# Patient Record
Sex: Male | Born: 1945 | Race: White | Hispanic: No | Marital: Married | Smoking: Never smoker
Health system: Southern US, Community
[De-identification: ages and names within clinical notes are randomized; demographics above are authoritative.]

## PROBLEM LIST (undated history)

## (undated) DIAGNOSIS — I10 Essential (primary) hypertension: Secondary | ICD-10-CM

## (undated) DIAGNOSIS — N189 Chronic kidney disease, unspecified: Secondary | ICD-10-CM

## (undated) DIAGNOSIS — I639 Cerebral infarction, unspecified: Secondary | ICD-10-CM

## (undated) DIAGNOSIS — E291 Testicular hypofunction: Secondary | ICD-10-CM

## (undated) DIAGNOSIS — F419 Anxiety disorder, unspecified: Secondary | ICD-10-CM

## (undated) DIAGNOSIS — C439 Malignant melanoma of skin, unspecified: Secondary | ICD-10-CM

## (undated) DIAGNOSIS — E785 Hyperlipidemia, unspecified: Secondary | ICD-10-CM

## (undated) HISTORY — DX: Hyperlipidemia, unspecified: E78.5

## (undated) HISTORY — PX: CHOLECYSTECTOMY: SHX55

## (undated) HISTORY — DX: Malignant melanoma of skin, unspecified: C43.9

## (undated) HISTORY — PX: ROTATOR CUFF REPAIR: SHX139

## (undated) HISTORY — DX: Testicular hypofunction: E29.1

## (undated) HISTORY — DX: Cerebral infarction, unspecified: I63.9

## (undated) HISTORY — PX: APPENDECTOMY: SHX54

## (undated) HISTORY — DX: Anxiety disorder, unspecified: F41.9

## (undated) HISTORY — PX: COLONOSCOPY: SHX174

## (undated) HISTORY — DX: Chronic kidney disease, unspecified: N18.9

## (undated) HISTORY — DX: Essential (primary) hypertension: I10

---

## 2008-02-02 ENCOUNTER — Ambulatory Visit: Payer: Self-pay | Admitting: Gastroenterology

## 2010-12-05 ENCOUNTER — Encounter: Payer: Self-pay | Admitting: Family Medicine

## 2010-12-05 DIAGNOSIS — M109 Gout, unspecified: Secondary | ICD-10-CM | POA: Insufficient documentation

## 2010-12-05 DIAGNOSIS — I10 Essential (primary) hypertension: Secondary | ICD-10-CM | POA: Insufficient documentation

## 2011-06-05 DIAGNOSIS — E291 Testicular hypofunction: Secondary | ICD-10-CM | POA: Diagnosis not present

## 2011-06-05 DIAGNOSIS — I10 Essential (primary) hypertension: Secondary | ICD-10-CM | POA: Diagnosis not present

## 2011-06-05 DIAGNOSIS — N419 Inflammatory disease of prostate, unspecified: Secondary | ICD-10-CM | POA: Diagnosis not present

## 2011-11-26 DIAGNOSIS — H6980 Other specified disorders of Eustachian tube, unspecified ear: Secondary | ICD-10-CM | POA: Diagnosis not present

## 2011-11-26 DIAGNOSIS — J019 Acute sinusitis, unspecified: Secondary | ICD-10-CM | POA: Diagnosis not present

## 2012-04-28 DIAGNOSIS — Z23 Encounter for immunization: Secondary | ICD-10-CM | POA: Diagnosis not present

## 2012-07-31 ENCOUNTER — Encounter: Payer: Self-pay | Admitting: *Deleted

## 2012-07-31 DIAGNOSIS — M1A00X0 Idiopathic chronic gout, unspecified site, without tophus (tophi): Secondary | ICD-10-CM | POA: Diagnosis not present

## 2012-07-31 DIAGNOSIS — M1A9XX0 Chronic gout, unspecified, without tophus (tophi): Secondary | ICD-10-CM | POA: Diagnosis not present

## 2012-07-31 DIAGNOSIS — E782 Mixed hyperlipidemia: Secondary | ICD-10-CM | POA: Diagnosis not present

## 2012-07-31 DIAGNOSIS — Z125 Encounter for screening for malignant neoplasm of prostate: Secondary | ICD-10-CM | POA: Diagnosis not present

## 2012-08-05 ENCOUNTER — Encounter: Payer: Self-pay | Admitting: Family Medicine

## 2012-08-05 ENCOUNTER — Ambulatory Visit (INDEPENDENT_AMBULATORY_CARE_PROVIDER_SITE_OTHER): Payer: Medicare Other | Admitting: Family Medicine

## 2012-08-05 VITALS — BP 130/90 | HR 86 | Temp 98.5°F | Resp 16 | Ht 71.0 in | Wt 236.0 lb

## 2012-08-05 DIAGNOSIS — E785 Hyperlipidemia, unspecified: Secondary | ICD-10-CM | POA: Diagnosis not present

## 2012-08-05 DIAGNOSIS — I10 Essential (primary) hypertension: Secondary | ICD-10-CM

## 2012-08-05 DIAGNOSIS — M109 Gout, unspecified: Secondary | ICD-10-CM

## 2012-08-05 DIAGNOSIS — E291 Testicular hypofunction: Secondary | ICD-10-CM

## 2012-08-05 MED ORDER — LOSARTAN POTASSIUM 100 MG PO TABS: 100.0000 mg | ORAL_TABLET | Freq: Every day | ORAL | Status: DC

## 2012-08-05 NOTE — Progress Notes (Signed)
Subjective:     Patient ID: Chad Serrano, male   DOB: 15-Apr-1946, 67 y.o.   MRN: [id number]  HPI He here for followup of his hypertension.  He is currently taking Benicar 40 mg by mouth daily.  However this is too expensive as is $300 per month.  He denies any chest pain shortness of breath or dyspnea on exertion.   He is also here for follow up of his gout.  He is currently on allopurinol 100 mg by mouth.  He denies any recent gout flare.  His most recent uric acid is 5.9.   He is also here for followup of his hypogonadism. He is currently taking AndroGel.  His fatigue, libido, and exercise stamina have improved.  His PSA and CBC are within normal limits He also has hyperlipidemia. His trigs are 170, HDL 41 and LDL is 114.  Is not currently taking any medication.    He is concerned about the recent advertisement on TV regarding cardiovascular disease and testosterone replacement.     Review of Systems  All other systems reviewed and are negative.       Objective:   Physical Exam  Vitals reviewed. Constitutional: He appears well-developed and well-nourished.  HENT:  Head: Normocephalic and atraumatic.  Right Ear: External ear normal.  Left Ear: External ear normal.  Nose: Nose normal.  Mouth/Throat: Oropharynx is clear and moist.  Eyes: Conjunctivae and EOM are normal. Pupils are equal, round, and reactive to light.  Neck: Normal range of motion. Neck supple. No thyromegaly present.  Cardiovascular: Normal rate, regular rhythm and normal heart sounds.   Pulmonary/Chest: Effort normal and breath sounds normal.  Abdominal: Soft. Bowel sounds are normal.       Assessment:     Hypertension, hyperlipidemia, hypogonadism, and gout.     Plan:     #1 discontinue Benicar begin losartan 100 mg by mouth daily for cost reasons.  Recheck blood pressure in 6 months consider adding HCTZ if blood pressure is greater than 140 over 90. #2 hypergonadism is currently well controlled. Spent  10 minutes discussing the risk of cardiovascular disease with AndroGel replacement I advised the patient to discontinue and androgel. He will consider. #3 gout is presently well controlled 100 mg of allopurinol per day. #4 hyperlipidemia-discussed a low saturated fat diet. Recheck in 6 months.

## 2012-08-11 DIAGNOSIS — M19019 Primary osteoarthritis, unspecified shoulder: Secondary | ICD-10-CM | POA: Diagnosis not present

## 2012-08-11 DIAGNOSIS — S46819A Strain of other muscles, fascia and tendons at shoulder and upper arm level, unspecified arm, initial encounter: Secondary | ICD-10-CM | POA: Diagnosis not present

## 2012-08-11 DIAGNOSIS — M75120 Complete rotator cuff tear or rupture of unspecified shoulder, not specified as traumatic: Secondary | ICD-10-CM | POA: Diagnosis not present

## 2012-08-11 DIAGNOSIS — S43499A Other sprain of unspecified shoulder joint, initial encounter: Secondary | ICD-10-CM | POA: Diagnosis not present

## 2012-08-12 ENCOUNTER — Other Ambulatory Visit: Payer: Self-pay | Admitting: Family Medicine

## 2012-08-12 DIAGNOSIS — M109 Gout, unspecified: Secondary | ICD-10-CM

## 2012-08-12 NOTE — Telephone Encounter (Deleted)
Plain Losartan 100mg  just started and refilled on 08/05/2012.  Not in record on combination Losartan/hct 100/12.5. Please advise about this refill.

## 2012-08-12 NOTE — Telephone Encounter (Signed)
Medication refilled per protocol.Patient needs to be seen before any further refills 

## 2012-08-25 ENCOUNTER — Telehealth: Payer: Self-pay | Admitting: Family Medicine

## 2012-08-25 DIAGNOSIS — M109 Gout, unspecified: Secondary | ICD-10-CM

## 2012-08-25 MED ORDER — ALLOPURINOL 100 MG PO TABS: 200.0000 mg | ORAL_TABLET | Freq: Every day | ORAL | Status: DC

## 2012-08-25 NOTE — Telephone Encounter (Signed)
rx refilled.

## 2012-10-27 ENCOUNTER — Telehealth: Payer: Self-pay | Admitting: Family Medicine

## 2012-10-27 NOTE — Telephone Encounter (Signed)
Losartan 100 mg poqday is on his med list.  He can take that in place of benicar.

## 2012-10-28 MED ORDER — LOSARTAN POTASSIUM 100 MG PO TABS: 100.0000 mg | ORAL_TABLET | Freq: Every day | ORAL | Status: DC

## 2012-10-28 NOTE — Telephone Encounter (Signed)
Rx Refilled  

## 2012-12-26 ENCOUNTER — Telehealth: Payer: Self-pay | Admitting: Family Medicine

## 2012-12-26 MED ORDER — ALLOPURINOL 100 MG PO TABS: 200.0000 mg | ORAL_TABLET | Freq: Every day | ORAL | Status: DC

## 2012-12-26 NOTE — Telephone Encounter (Signed)
Rx Refilled  

## 2013-04-13 DIAGNOSIS — Z23 Encounter for immunization: Secondary | ICD-10-CM | POA: Diagnosis not present

## 2013-05-05 ENCOUNTER — Other Ambulatory Visit: Payer: Medicare Other

## 2013-05-05 ENCOUNTER — Other Ambulatory Visit: Payer: Self-pay | Admitting: Family Medicine

## 2013-05-05 DIAGNOSIS — Z79899 Other long term (current) drug therapy: Secondary | ICD-10-CM

## 2013-05-05 DIAGNOSIS — Z125 Encounter for screening for malignant neoplasm of prostate: Secondary | ICD-10-CM | POA: Diagnosis not present

## 2013-05-05 DIAGNOSIS — I10 Essential (primary) hypertension: Secondary | ICD-10-CM | POA: Diagnosis not present

## 2013-05-05 DIAGNOSIS — R7309 Other abnormal glucose: Secondary | ICD-10-CM | POA: Diagnosis not present

## 2013-05-05 DIAGNOSIS — E782 Mixed hyperlipidemia: Secondary | ICD-10-CM | POA: Diagnosis not present

## 2013-05-05 LAB — CBC WITH DIFFERENTIAL/PLATELET
Basophils Relative: 0 % (ref 0–1)
Eosinophils Absolute: 0.2 10*3/uL (ref 0.0–0.7)
HCT: 40.7 % (ref 39.0–52.0)
Hemoglobin: 14.3 g/dL (ref 13.0–17.0)
MCH: 32.1 pg (ref 26.0–34.0)
MCHC: 35.1 g/dL (ref 30.0–36.0)
MCV: 91.5 fL (ref 78.0–100.0)
Monocytes Absolute: 0.5 10*3/uL (ref 0.1–1.0)
Monocytes Relative: 11 % (ref 3–12)

## 2013-05-05 LAB — COMPREHENSIVE METABOLIC PANEL
Albumin: 4 g/dL (ref 3.5–5.2)
Alkaline Phosphatase: 70 U/L (ref 39–117)
BUN: 22 mg/dL (ref 6–23)
Glucose, Bld: 126 mg/dL — ABNORMAL HIGH (ref 70–99)
Potassium: 4.1 mEq/L (ref 3.5–5.3)
Total Bilirubin: 0.7 mg/dL (ref 0.3–1.2)

## 2013-05-05 LAB — LIPID PANEL
HDL: 40 mg/dL (ref >39)
LDL Cholesterol: 87 mg/dL (ref 0–99)
Triglycerides: 255 mg/dL — ABNORMAL HIGH (ref <150)

## 2013-05-11 ENCOUNTER — Ambulatory Visit (INDEPENDENT_AMBULATORY_CARE_PROVIDER_SITE_OTHER): Payer: Medicare Other | Admitting: Family Medicine

## 2013-05-11 ENCOUNTER — Encounter: Payer: Self-pay | Admitting: Family Medicine

## 2013-05-11 VITALS — BP 110/72 | HR 74 | Temp 97.1°F | Resp 16 | Ht 70.5 in | Wt 247.0 lb

## 2013-05-11 DIAGNOSIS — Z23 Encounter for immunization: Secondary | ICD-10-CM | POA: Diagnosis not present

## 2013-05-11 DIAGNOSIS — R7309 Other abnormal glucose: Secondary | ICD-10-CM | POA: Diagnosis not present

## 2013-05-11 DIAGNOSIS — Z0000 Encounter for general adult medical examination without abnormal findings: Secondary | ICD-10-CM | POA: Diagnosis not present

## 2013-05-11 DIAGNOSIS — R739 Hyperglycemia, unspecified: Secondary | ICD-10-CM

## 2013-05-11 LAB — HEMOGLOBIN A1C
Hgb A1c MFr Bld: 5.6 % (ref <5.7)
Mean Plasma Glucose: 114 mg/dL (ref <117)

## 2013-05-11 NOTE — Addendum Note (Signed)
Addended by: Legrand Rams B on: 05/11/2013 10:26 AM   Modules accepted: Orders

## 2013-05-11 NOTE — Progress Notes (Signed)
Subjective:    Patient ID: Chad Serrano, male    DOB: 01/05/1946, 67 y.o.   MRN: [id number]  HPI Patient is here today for a complete physical exam. He denies any complaints. His last colonoscopy was in 2009. It was significant for 2 polyps. He is due this year for a colonoscopy but he would like to schedule this for himself after the first year. His last tetanus shot was 4 years ago. He had his flu shot at an outside pharmacy. He is due for Prevnar 13, Pneumovax 23, and Zostavax. He is also due for his prostate exam. His most recent lab work is listed below. It is significant for an elevated triglyceride level as well as an elevated fasting blood sugar. Lab on 05/05/2013  Component Date Value Range Status  . WBC 05/05/2013 4.5  4.0 - 10.5 K/uL Final  . RBC 05/05/2013 4.45  4.22 - 5.81 MIL/uL Final  . Hemoglobin 05/05/2013 14.3  13.0 - 17.0 g/dL Final  . HCT 78/29/5621 40.7  39.0 - 52.0 % Final  . MCV 05/05/2013 91.5  78.0 - 100.0 fL Final  . MCH 05/05/2013 32.1  26.0 - 34.0 pg Final  . MCHC 05/05/2013 35.1  30.0 - 36.0 g/dL Final  . RDW 30/86/5784 13.8  11.5 - 15.5 % Final  . Platelets 05/05/2013 165  150 - 400 K/uL Final  . Neutrophils Relative % 05/05/2013 42* 43 - 77 % Final  . Neutro Abs 05/05/2013 1.9  1.7 - 7.7 K/uL Final  . Lymphocytes Relative 05/05/2013 43  12 - 46 % Final  . Lymphs Abs 05/05/2013 1.9  0.7 - 4.0 K/uL Final  . Monocytes Relative 05/05/2013 11  3 - 12 % Final  . Monocytes Absolute 05/05/2013 0.5  0.1 - 1.0 K/uL Final  . Eosinophils Relative 05/05/2013 4  0 - 5 % Final  . Eosinophils Absolute 05/05/2013 0.2  0.0 - 0.7 K/uL Final  . Basophils Relative 05/05/2013 0  0 - 1 % Final  . Basophils Absolute 05/05/2013 0.0  0.0 - 0.1 K/uL Final  . Smear Review 05/05/2013 Criteria for review not met   Final  . Sodium 05/05/2013 139  135 - 145 mEq/L Final  . Potassium 05/05/2013 4.1  3.5 - 5.3 mEq/L Final  . Chloride 05/05/2013 104  96 - 112 mEq/L Final  . CO2  05/05/2013 30  19 - 32 mEq/L Final  . Glucose, Bld 05/05/2013 126* 70 - 99 mg/dL Final  . BUN 69/62/9528 22  6 - 23 mg/dL Final  . Creat 41/32/4401 1.29  0.50 - 1.35 mg/dL Final  . Total Bilirubin 05/05/2013 0.7  0.3 - 1.2 mg/dL Final  . Alkaline Phosphatase 05/05/2013 70  39 - 117 U/L Final  . AST 05/05/2013 24  0 - 37 U/L Final  . ALT 05/05/2013 24  0 - 53 U/L Final  . Total Protein 05/05/2013 6.4  6.0 - 8.3 g/dL Final  . Albumin 02/72/5366 4.0  3.5 - 5.2 g/dL Final  . Calcium 44/07/4740 9.2  8.4 - 10.5 mg/dL Final  . Cholesterol 59/56/3875 178  0 - 200 mg/dL Final   Comment: ATP III Classification:                                < 200        mg/dL        Desirable  200 - 239     mg/dL        Borderline High                               >= 240        mg/dL        High                             . Triglycerides 05/05/2013 255* <150 mg/dL Final  . HDL 09/81/1914 40  >39 mg/dL Final  . Total CHOL/HDL Ratio 05/05/2013 4.5   Final  . VLDL 05/05/2013 51* 0 - 40 mg/dL Final  . LDL Cholesterol 05/05/2013 87  0 - 99 mg/dL Final   Comment:                            Total Cholesterol/HDL Ratio:CHD Risk                                                 Coronary Heart Disease Risk Table                                                                 Men       Women                                   1/2 Average Risk              3.4        3.3                                       Average Risk              5.0        4.4                                    2X Average Risk              9.6        7.1                                    3X Average Risk             23.4       11.0                          Use the calculated Patient Ratio above and the CHD Risk table  to determine the patient's CHD Risk.                          ATP III Classification (LDL):                                < 100        mg/dL         Optimal                                100 - 129     mg/dL         Near or Above Optimal                               130 - 159     mg/dL         Borderline High                               160 - 189     mg/dL         High                                > 190        mg/dL         Very High                             . PSA 05/05/2013 0.74  <=4.00 ng/mL Final   Comment: Test Methodology: ECLIA PSA (Electrochemiluminescence Immunoassay)                                                     For PSA values from 2.5-4.0, particularly in younger men <60 years                          old, the AUA and NCCN suggest testing for % Free PSA (3515) and                          evaluation of the rate of increase in PSA (PSA velocity).   Past Medical History  Diagnosis Date  . Hypertension   . Hypogonadism male   . Anxiety   . Gout   . Hyperlipidemia   . Hypogonadism male    Past Surgical History  Procedure Laterality Date  . Appendectomy    . Rotator cuff repair     Current Outpatient Prescriptions on File Prior to Visit  Medication Sig Dispense Refill  . allopurinol (ZYLOPRIM) 100 MG tablet Take 2 tablets (200 mg total) by mouth daily.  180 tablet  1  . losartan (COZAAR) 100 MG tablet Take 1 tablet (100 mg total) by mouth daily.  90 tablet  3  . sildenafil (VIAGRA) 100 MG tablet Take 100 mg by mouth daily as needed.         No current  facility-administered medications on file prior to visit.   No Known Allergies History   Social History  . Marital Status: Married    Spouse Name: N/A    Number of Children: N/A  . Years of Education: N/A   Occupational History  . Not on file.   Social History Main Topics  . Smoking status: Never Smoker   . Smokeless tobacco: Never Used  . Alcohol Use: No  . Drug Use: No  . 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 Not on file   Social History Narrative  . No narrative on file      Review of Systems   All other systems reviewed and are negative.       Objective:   Physical Exam  Vitals reviewed. Constitutional: He is oriented to person, place, and time. He appears well-developed and well-nourished. No distress.  HENT:  Head: Normocephalic and atraumatic.  Right Ear: External ear normal.  Left Ear: External ear normal.  Nose: Nose normal.  Mouth/Throat: Oropharynx is clear and moist. No oropharyngeal exudate.  Eyes: Conjunctivae and EOM are normal. Pupils are equal, round, and reactive to light. Right eye exhibits no discharge. Left eye exhibits no discharge. No scleral icterus.  Neck: Normal range of motion. Neck supple. No JVD present. No tracheal deviation present. No thyromegaly present.  Cardiovascular: Normal rate, regular rhythm, normal heart sounds and intact distal pulses.  Exam reveals no gallop and no friction rub.   No murmur heard. Pulmonary/Chest: Effort normal and breath sounds normal. No stridor. No respiratory distress. He has no wheezes. He has no rales. He exhibits no tenderness.  Abdominal: Soft. Bowel sounds are normal. He exhibits no distension and no mass. There is no tenderness. There is no rebound and no guarding.  Genitourinary: Rectum normal and prostate normal. No penile tenderness.  Musculoskeletal: Normal range of motion. He exhibits no edema and no tenderness.  Lymphadenopathy:    He has no cervical adenopathy.  Neurological: He is alert and oriented to person, place, and time. He has normal reflexes. He displays normal reflexes. No cranial nerve deficit. He exhibits normal muscle tone. Coordination normal.  Skin: Skin is warm. Rash noted. He is not diaphoretic. There is erythema.  Psychiatric: He has a normal mood and affect. His behavior is normal. Judgment and thought content normal.   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 He also has several erythematous macules and papules on his for head and cheeks. He is scheduled to see a  dermatologist later this month.        Assessment & Plan:  1. Routine general medical examination at a health care facility Patient's physical exam was completely normal. The patient received Prevnar 13 today. I recommended Fosamax. The patient called his pharmacy and check on the price first. The patient will schedule his colonoscopy at the first of the year. We discussed a low saturated fat low carbohydrate diet for his elevated triglycerides as well as his elevated fasting blood sugar. I will check hemoglobin A1c and I recommend medication if greater than 6.5. Otherwise his physical exam is normal. 2. Hyperglycemia

## 2013-05-18 DIAGNOSIS — L408 Other psoriasis: Secondary | ICD-10-CM | POA: Diagnosis not present

## 2013-06-22 ENCOUNTER — Other Ambulatory Visit: Payer: Self-pay | Admitting: Family Medicine

## 2013-10-15 DIAGNOSIS — L570 Actinic keratosis: Secondary | ICD-10-CM | POA: Diagnosis not present

## 2013-10-15 DIAGNOSIS — L821 Other seborrheic keratosis: Secondary | ICD-10-CM | POA: Diagnosis not present

## 2013-10-15 DIAGNOSIS — L819 Disorder of pigmentation, unspecified: Secondary | ICD-10-CM | POA: Diagnosis not present

## 2013-10-15 DIAGNOSIS — L723 Sebaceous cyst: Secondary | ICD-10-CM | POA: Diagnosis not present

## 2013-10-21 ENCOUNTER — Other Ambulatory Visit: Payer: Self-pay | Admitting: Family Medicine

## 2013-12-03 ENCOUNTER — Encounter: Payer: Self-pay | Admitting: Family Medicine

## 2013-12-03 ENCOUNTER — Ambulatory Visit (INDEPENDENT_AMBULATORY_CARE_PROVIDER_SITE_OTHER): Payer: Medicare Other | Admitting: Family Medicine

## 2013-12-03 VITALS — BP 119/74 | HR 84 | Temp 99.2°F | Resp 16 | Ht 70.5 in | Wt 233.0 lb

## 2013-12-03 DIAGNOSIS — M545 Low back pain, unspecified: Secondary | ICD-10-CM | POA: Diagnosis not present

## 2013-12-03 NOTE — Progress Notes (Signed)
   Subjective:    Patient ID: Chad Serrano, male    DOB: 15-Apr-1946, 68 y.o.   MRN: [id number]  HPI Patient reports 3 months of midline low back pain with pain radiating into both hips.  The pain radiates into the posterior aspect of both thighs, down both calves, into both feet. He describes the pain as an electrical type pain. He also reports pins and needles in his leg. The pain can also be burning in nature. He denies any specific injury to his back. The pain has gradually been worsening. He denies any numbness in his crotch. He denies any saddle anesthesia. He denies any bowel or bladder incontinence. He denies any weakness in the leg. He has a negative straight-leg raise bilaterally. He has normal muscle in both legs. He has normal reflexes in both legs. Past Medical History  Diagnosis Date  . Hypertension   . Hypogonadism male   . Anxiety   . Gout   . Hyperlipidemia   . Hypogonadism male    Current Outpatient Prescriptions on File Prior to Visit  Medication Sig Dispense Refill  . allopurinol (ZYLOPRIM) 100 MG tablet TAKE 2 TABLETS (200 MG TOTAL) BY MOUTH DAILY.  180 tablet  1  . losartan (COZAAR) 100 MG tablet TAKE 1 TABLET (100 MG TOTAL) BY MOUTH DAILY.  90 tablet  3  . sildenafil (VIAGRA) 100 MG tablet Take 100 mg by mouth daily as needed.         No current facility-administered medications on file prior to visit.   No Known Allergies History   Social History  . Marital Status: Married    Spouse Name: N/A    Number of Children: N/A  . Years of Education: N/A   Occupational History  . Not on file.   Social History Main Topics  . Smoking status: Never Smoker   . Smokeless tobacco: Never Used  . Alcohol Use: No  . Drug Use: No  . 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 Not on file   Social History Narrative  . No narrative on file      Review of Systems  All other systems reviewed and  are negative.      Objective:   Physical Exam  Vitals reviewed. Constitutional: He is oriented to person, place, and time.  Cardiovascular: Normal rate, regular rhythm and normal heart sounds.   Pulmonary/Chest: Effort normal and breath sounds normal.  Musculoskeletal: Normal range of motion. He exhibits no edema.       Lumbar back: He exhibits tenderness and pain. He exhibits normal range of motion and no bony tenderness.  Neurological: He is alert and oriented to person, place, and time. He has normal reflexes. He displays normal reflexes. No cranial nerve deficit. He exhibits normal muscle tone. Coordination normal.          Assessment & Plan:  1. Midline low back pain, with sciatica presence unspecified Patient symptoms sound like spinal stenosis. However his exam is extremely reassuring and normal. Proceed with an MRI of the lumbar spine. The patient has moderate to severe spinal stenosis, he may want to consider epidural steroid injections versus surgery. We will discuss treatment options further depending upon the results of the MRI.

## 2013-12-05 ENCOUNTER — Ambulatory Visit
Admission: RE | Admit: 2013-12-05 | Discharge: 2013-12-05 | Disposition: A | Discharge status: Home / Self Care | Payer: Medicare Other | Source: Ambulatory Visit | Attending: Family Medicine | Admitting: Family Medicine

## 2013-12-05 DIAGNOSIS — M545 Low back pain: Secondary | ICD-10-CM

## 2013-12-05 DIAGNOSIS — M48061 Spinal stenosis, lumbar region without neurogenic claudication: Secondary | ICD-10-CM | POA: Diagnosis not present

## 2013-12-05 DIAGNOSIS — M5137 Other intervertebral disc degeneration, lumbosacral region: Secondary | ICD-10-CM | POA: Diagnosis not present

## 2013-12-05 DIAGNOSIS — M47817 Spondylosis without myelopathy or radiculopathy, lumbosacral region: Secondary | ICD-10-CM | POA: Diagnosis not present

## 2013-12-05 DIAGNOSIS — M5126 Other intervertebral disc displacement, lumbar region: Secondary | ICD-10-CM | POA: Diagnosis not present

## 2013-12-08 ENCOUNTER — Telehealth: Payer: Self-pay | Admitting: Family Medicine

## 2013-12-08 DIAGNOSIS — M48061 Spinal stenosis, lumbar region without neurogenic claudication: Secondary | ICD-10-CM

## 2013-12-08 NOTE — Telephone Encounter (Signed)
Pt called about MRI results.  Results given.  Told  Dr Dennard Schaumann wanted to know if he was interested in steroid injections.  Pt wanted to know what all his options may be.  Told him we will go ahead with referral to Dr Gloris Manchester.  He can discuss with patient about his results further and all his options.  Pt agreeable.

## 2013-12-08 NOTE — Telephone Encounter (Signed)
Message copied by Olena Mater on Tue Dec 08, 2013  2:51 PM ------      Message from: Jenna Luo      Created: Mon Dec 07, 2013  7:07 AM       Appears to have spinal stenosis at L4-5.  Is he interested in epidural steroid injection.  If so, consult Dr. Nelva Bush. ------

## 2013-12-16 DIAGNOSIS — M545 Low back pain, unspecified: Secondary | ICD-10-CM | POA: Diagnosis not present

## 2013-12-16 DIAGNOSIS — M48061 Spinal stenosis, lumbar region without neurogenic claudication: Secondary | ICD-10-CM | POA: Diagnosis not present

## 2013-12-16 DIAGNOSIS — M5137 Other intervertebral disc degeneration, lumbosacral region: Secondary | ICD-10-CM | POA: Diagnosis not present

## 2013-12-25 DIAGNOSIS — M545 Low back pain, unspecified: Secondary | ICD-10-CM | POA: Diagnosis not present

## 2014-04-22 DIAGNOSIS — M4807 Spinal stenosis, lumbosacral region: Secondary | ICD-10-CM | POA: Diagnosis not present

## 2014-04-22 DIAGNOSIS — M5136 Other intervertebral disc degeneration, lumbar region: Secondary | ICD-10-CM | POA: Diagnosis not present

## 2014-04-22 DIAGNOSIS — M545 Low back pain: Secondary | ICD-10-CM | POA: Diagnosis not present

## 2014-04-26 DIAGNOSIS — H52223 Regular astigmatism, bilateral: Secondary | ICD-10-CM | POA: Diagnosis not present

## 2014-04-26 DIAGNOSIS — H524 Presbyopia: Secondary | ICD-10-CM | POA: Diagnosis not present

## 2014-04-26 DIAGNOSIS — H5203 Hypermetropia, bilateral: Secondary | ICD-10-CM | POA: Diagnosis not present

## 2014-04-26 DIAGNOSIS — H04123 Dry eye syndrome of bilateral lacrimal glands: Secondary | ICD-10-CM | POA: Diagnosis not present

## 2014-04-30 ENCOUNTER — Other Ambulatory Visit: Payer: Self-pay | Admitting: Family Medicine

## 2014-04-30 ENCOUNTER — Other Ambulatory Visit (INDEPENDENT_AMBULATORY_CARE_PROVIDER_SITE_OTHER): Payer: Medicare Other | Admitting: Family Medicine

## 2014-04-30 DIAGNOSIS — Z23 Encounter for immunization: Secondary | ICD-10-CM

## 2014-04-30 DIAGNOSIS — Z0000 Encounter for general adult medical examination without abnormal findings: Secondary | ICD-10-CM | POA: Diagnosis not present

## 2014-04-30 DIAGNOSIS — Z79899 Other long term (current) drug therapy: Secondary | ICD-10-CM

## 2014-04-30 DIAGNOSIS — I10 Essential (primary) hypertension: Secondary | ICD-10-CM | POA: Diagnosis not present

## 2014-04-30 LAB — CBC WITH DIFFERENTIAL/PLATELET
BASOS ABS: 0 10*3/uL (ref 0.0–0.1)
BASOS PCT: 1 % (ref 0–1)
Eosinophils Absolute: 0.1 10*3/uL (ref 0.0–0.7)
Eosinophils Relative: 3 % (ref 0–5)
HCT: 41.4 % (ref 39.0–52.0)
Hemoglobin: 14.4 g/dL (ref 13.0–17.0)
Lymphocytes Relative: 42 % (ref 12–46)
Lymphs Abs: 1.8 10*3/uL (ref 0.7–4.0)
MCH: 32.1 pg (ref 26.0–34.0)
MCHC: 34.8 g/dL (ref 30.0–36.0)
MCV: 92.4 fL (ref 78.0–100.0)
MPV: 10.4 fL (ref 9.4–12.4)
Monocytes Absolute: 0.5 10*3/uL (ref 0.1–1.0)
Monocytes Relative: 13 % — ABNORMAL HIGH (ref 3–12)
NEUTROS ABS: 1.7 10*3/uL (ref 1.7–7.7)
NEUTROS PCT: 41 % — AB (ref 43–77)
PLATELETS: 175 10*3/uL (ref 150–400)
RBC: 4.48 MIL/uL (ref 4.22–5.81)
RDW: 13.5 % (ref 11.5–15.5)
WBC: 4.2 10*3/uL (ref 4.0–10.5)

## 2014-04-30 LAB — LIPID PANEL
CHOLESTEROL: 184 mg/dL (ref 0–200)
HDL: 47 mg/dL (ref >39)
LDL CALC: 115 mg/dL — AB (ref 0–99)
Total CHOL/HDL Ratio: 3.9 Ratio
Triglycerides: 109 mg/dL (ref <150)
VLDL: 22 mg/dL (ref 0–40)

## 2014-04-30 LAB — COMPLETE METABOLIC PANEL WITH GFR
ALBUMIN: 4 g/dL (ref 3.5–5.2)
ALK PHOS: 72 U/L (ref 39–117)
ALT: 21 U/L (ref 0–53)
AST: 27 U/L (ref 0–37)
BUN: 19 mg/dL (ref 6–23)
CO2: 28 mEq/L (ref 19–32)
Calcium: 9 mg/dL (ref 8.4–10.5)
Chloride: 102 mEq/L (ref 96–112)
Creat: 1.36 mg/dL — ABNORMAL HIGH (ref 0.50–1.35)
GFR, Est African American: 61 mL/min
GFR, Est Non African American: 53 mL/min — ABNORMAL LOW
Glucose, Bld: 97 mg/dL (ref 70–99)
Potassium: 4.5 mEq/L (ref 3.5–5.3)
SODIUM: 137 meq/L (ref 135–145)
TOTAL PROTEIN: 6.2 g/dL (ref 6.0–8.3)
Total Bilirubin: 0.8 mg/dL (ref 0.2–1.2)

## 2014-04-30 LAB — HEMOGLOBIN A1C
Hgb A1c MFr Bld: 5.8 % — ABNORMAL HIGH (ref <5.7)
MEAN PLASMA GLUCOSE: 120 mg/dL — AB (ref <117)

## 2014-05-06 ENCOUNTER — Encounter: Payer: Self-pay | Admitting: Family Medicine

## 2014-05-06 ENCOUNTER — Ambulatory Visit (INDEPENDENT_AMBULATORY_CARE_PROVIDER_SITE_OTHER): Payer: Medicare Other | Admitting: Family Medicine

## 2014-05-06 VITALS — BP 112/70 | HR 78 | Temp 97.8°F | Resp 16 | Ht 70.5 in | Wt 232.0 lb

## 2014-05-06 DIAGNOSIS — Z0000 Encounter for general adult medical examination without abnormal findings: Secondary | ICD-10-CM

## 2014-05-06 MED ORDER — VARDENAFIL HCL 10 MG PO TABS: 10.0000 mg | ORAL_TABLET | Freq: Every day | ORAL | Status: DC | PRN

## 2014-05-06 NOTE — Progress Notes (Signed)
Subjective:    Patient ID: Chad Serrano, male    DOB: [birth year], 68 y.o.   MRN: [id number]  HPI  Patient is here today for complete physical exam. He is still dealing with some lumbar spinal stenosis. He is managing it conservatively with physical therapy. At the time he denies any symptoms of cauda equina syndrome. His last colonoscopy was in 2009. He is due for prostate cancer screening today. Patient has had Prevnar 13, Pneumovax 23, and his flu shot. His most recent lab work as listed below Lab on 04/30/2014  Component Date Value Ref Range Status  . Cholesterol 04/30/2014 184  0 - 200 mg/dL Final   Comment: ATP III Classification:       < 200        mg/dL        Desirable      200 - 239     mg/dL        Borderline High      >= 240        mg/dL        High     . Triglycerides 04/30/2014 109  <150 mg/dL Final  . HDL 04/30/2014 47  >39 mg/dL Final  . Total CHOL/HDL Ratio 04/30/2014 3.9   Final  . VLDL 04/30/2014 22  0 - 40 mg/dL Final  . LDL Cholesterol 04/30/2014 115* 0 - 99 mg/dL Final   Comment:   Total Cholesterol/HDL Ratio:CHD Risk                        Coronary Heart Disease Risk Table                                        Men       Women          1/2 Average Risk              3.4        3.3              Average Risk              5.0        4.4           2X Average Risk              9.6        7.1           3X Average Risk             23.4       11.0 Use the calculated Patient Ratio above and the CHD Risk table  to determine the patient's CHD Risk. ATP III Classification (LDL):       < 100        mg/dL         Optimal      100 - 129     mg/dL         Near or Above Optimal      130 - 159     mg/dL         Borderline High      160 - 189     mg/dL         High       > 190        mg/dL  Very High     . Hgb A1c MFr Bld 04/30/2014 5.8* <5.7 % Final   Comment:                                                                        According to the ADA Clinical  Practice Recommendations for 2011, when HbA1c is used as a screening test:     >=6.5%   Diagnostic of Diabetes Mellitus            (if abnormal result is confirmed)   5.7-6.4%   Increased risk of developing Diabetes Mellitus   References:Diagnosis and Classification of Diabetes Mellitus,Diabetes ZOXW,9604,54(UJWJX 1):S62-S69 and Standards of Medical Care in         Diabetes - 2011,Diabetes BJYN,8295,62 (Suppl 1):S11-S61.     . Mean Plasma Glucose 04/30/2014 120* <117 mg/dL Final  . WBC 04/30/2014 4.2  4.0 - 10.5 K/uL Final  . RBC 04/30/2014 4.48  4.22 - 5.81 MIL/uL Final  . Hemoglobin 04/30/2014 14.4  13.0 - 17.0 g/dL Final  . HCT 04/30/2014 41.4  39.0 - 52.0 % Final  . MCV 04/30/2014 92.4  78.0 - 100.0 fL Final  . MCH 04/30/2014 32.1  26.0 - 34.0 pg Final  . MCHC 04/30/2014 34.8  30.0 - 36.0 g/dL Final  . RDW 04/30/2014 13.5  11.5 - 15.5 % Final  . Platelets 04/30/2014 175  150 - 400 K/uL Final  . MPV 04/30/2014 10.4  9.4 - 12.4 fL Final  . Neutrophils Relative % 04/30/2014 41* 43 - 77 % Final  . Neutro Abs 04/30/2014 1.7  1.7 - 7.7 K/uL Final  . Lymphocytes Relative 04/30/2014 42  12 - 46 % Final  . Lymphs Abs 04/30/2014 1.8  0.7 - 4.0 K/uL Final  . Monocytes Relative 04/30/2014 13* 3 - 12 % Final  . Monocytes Absolute 04/30/2014 0.5  0.1 - 1.0 K/uL Final  . Eosinophils Relative 04/30/2014 3  0 - 5 % Final  . Eosinophils Absolute 04/30/2014 0.1  0.0 - 0.7 K/uL Final  . Basophils Relative 04/30/2014 1  0 - 1 % Final  . Basophils Absolute 04/30/2014 0.0  0.0 - 0.1 K/uL Final  . Smear Review 04/30/2014 Criteria for review not met   Final  . Sodium 04/30/2014 137  135 - 145 mEq/L Final  . Potassium 04/30/2014 4.5  3.5 - 5.3 mEq/L Final  . Chloride 04/30/2014 102  96 - 112 mEq/L Final  . CO2 04/30/2014 28  19 - 32 mEq/L Final  . Glucose, Bld 04/30/2014 97  70 - 99 mg/dL Final  . BUN 04/30/2014 19  6 - 23 mg/dL Final  . Creat 04/30/2014 1.36* 0.50 - 1.35 mg/dL Final  . Total  Bilirubin 04/30/2014 0.8  0.2 - 1.2 mg/dL Final  . Alkaline Phosphatase 04/30/2014 72  39 - 117 U/L Final  . AST 04/30/2014 27  0 - 37 U/L Final  . ALT 04/30/2014 21  0 - 53 U/L Final  . Total Protein 04/30/2014 6.2  6.0 - 8.3 g/dL Final  . Albumin 04/30/2014 4.0  3.5 - 5.2 g/dL Final  . Calcium 04/30/2014 9.0  8.4 - 10.5 mg/dL Final  . GFR, Est African American 04/30/2014 61   Final  .  GFR, Est Non African American 04/30/2014 53*  Final   Comment:   The estimated GFR is a calculation valid for adults (>=46 years old) that uses the CKD-EPI algorithm to adjust for age and sex. It is   not to be used for children, pregnant women, hospitalized patients,    patients on dialysis, or with rapidly changing kidney function. According to the NKDEP, eGFR >89 is normal, 60-89 shows mild impairment, 30-59 shows moderate impairment, 15-29 shows severe impairment and <15 is ESRD.      Past Medical History  Diagnosis Date  . Hypertension   . Hypogonadism male   . Anxiety   . Gout   . Hyperlipidemia   . Hypogonadism male    Past Surgical History  Procedure Laterality Date  . Appendectomy    . Rotator cuff repair     Current Outpatient Prescriptions on File Prior to Visit  Medication Sig Dispense Refill  . allopurinol (ZYLOPRIM) 100 MG tablet TAKE 2 TABLETS (200 MG TOTAL) BY MOUTH DAILY. 180 tablet 1  . losartan (COZAAR) 100 MG tablet TAKE 1 TABLET (100 MG TOTAL) BY MOUTH DAILY. 90 tablet 3  . sildenafil (VIAGRA) 100 MG tablet Take 100 mg by mouth daily as needed.       No current facility-administered medications on file prior to visit.   No Known Allergies History   Social History  . Marital Status: Married    Spouse Name: N/A    Number of Children: N/A  . Years of Education: N/A   Occupational History  . Not on file.   Social History Main Topics  . Smoking status: Never Smoker   . Smokeless tobacco: Never Used  . Alcohol Use: No  . Drug Use: No  . 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 Not on file   Social History Narrative   No family history on file.   Review of Systems  All other systems reviewed and are negative.      Objective:   Physical Exam  Constitutional: He is oriented to person, place, and time. He appears well-developed and well-nourished.  HENT:  Head: Normocephalic and atraumatic.  Right Ear: External ear normal.  Left Ear: External ear normal.  Nose: Nose normal.  Mouth/Throat: Oropharynx is clear and moist. No oropharyngeal exudate.  Eyes: Conjunctivae and EOM are normal. Pupils are equal, round, and reactive to light.  Neck: Normal range of motion. Neck supple. No JVD present. No tracheal deviation present. No thyromegaly present.  Cardiovascular: Normal rate, regular rhythm, normal heart sounds and intact distal pulses.  Exam reveals no gallop and no friction rub.   No murmur heard. Pulmonary/Chest: Effort normal and breath sounds normal. No stridor. No respiratory distress. He has no wheezes. He has no rales. He exhibits no tenderness.  Abdominal: Soft. Bowel sounds are normal. He exhibits no distension and no mass. There is no tenderness. There is no rebound and no guarding.  Genitourinary: Rectum normal, prostate normal and penis normal. Guaiac negative stool. No penile tenderness.  Musculoskeletal: Normal range of motion. He exhibits no edema or tenderness.  Lymphadenopathy:    He has no cervical adenopathy.  Neurological: He is alert and oriented to person, place, and time. He has normal reflexes. He displays normal reflexes. No cranial nerve deficit. He exhibits normal muscle tone. Coordination normal.  Skin: Skin is warm. No rash noted. No erythema.  No pallor.  Psychiatric: He has a normal mood and affect. His behavior is normal. Judgment and thought content normal.  Vitals reviewed.         Assessment & Plan:  Routine general medical  examination at a health care facility  Patient's physical exam today is completely normal. His blood pressure is excellent. His cholesterol is acceptable. We discussed a low carbohydrate diet to address hyperglycemia. His immunizations are up-to-date. His prostate exam is now up-to-date. I will add up to his lab work.

## 2014-05-07 LAB — PSA: PSA: 1 ng/mL (ref <=4.00)

## 2014-05-10 ENCOUNTER — Encounter: Payer: Self-pay | Admitting: *Deleted

## 2014-07-07 ENCOUNTER — Other Ambulatory Visit: Payer: Self-pay | Admitting: Family Medicine

## 2014-08-12 DIAGNOSIS — G894 Chronic pain syndrome: Secondary | ICD-10-CM | POA: Diagnosis not present

## 2014-08-12 DIAGNOSIS — Z79891 Long term (current) use of opiate analgesic: Secondary | ICD-10-CM | POA: Diagnosis not present

## 2014-08-12 DIAGNOSIS — M5136 Other intervertebral disc degeneration, lumbar region: Secondary | ICD-10-CM | POA: Diagnosis not present

## 2014-08-12 DIAGNOSIS — M4807 Spinal stenosis, lumbosacral region: Secondary | ICD-10-CM | POA: Diagnosis not present

## 2014-10-08 DIAGNOSIS — L820 Inflamed seborrheic keratosis: Secondary | ICD-10-CM | POA: Diagnosis not present

## 2014-10-08 DIAGNOSIS — L821 Other seborrheic keratosis: Secondary | ICD-10-CM | POA: Diagnosis not present

## 2014-10-08 DIAGNOSIS — L409 Psoriasis, unspecified: Secondary | ICD-10-CM | POA: Diagnosis not present

## 2014-10-08 DIAGNOSIS — L218 Other seborrheic dermatitis: Secondary | ICD-10-CM | POA: Diagnosis not present

## 2014-10-08 DIAGNOSIS — L400 Psoriasis vulgaris: Secondary | ICD-10-CM | POA: Diagnosis not present

## 2014-10-08 DIAGNOSIS — L812 Freckles: Secondary | ICD-10-CM | POA: Diagnosis not present

## 2014-10-08 DIAGNOSIS — D485 Neoplasm of uncertain behavior of skin: Secondary | ICD-10-CM | POA: Diagnosis not present

## 2014-10-19 ENCOUNTER — Other Ambulatory Visit: Payer: Self-pay | Admitting: Family Medicine

## 2014-10-28 DIAGNOSIS — M4807 Spinal stenosis, lumbosacral region: Secondary | ICD-10-CM | POA: Diagnosis not present

## 2014-10-28 DIAGNOSIS — Z79891 Long term (current) use of opiate analgesic: Secondary | ICD-10-CM | POA: Diagnosis not present

## 2014-10-28 DIAGNOSIS — M5136 Other intervertebral disc degeneration, lumbar region: Secondary | ICD-10-CM | POA: Diagnosis not present

## 2014-12-30 ENCOUNTER — Other Ambulatory Visit: Payer: Self-pay | Admitting: Family Medicine

## 2014-12-30 NOTE — Telephone Encounter (Signed)
Medication refilled per protocol. 

## 2015-03-24 ENCOUNTER — Telehealth: Payer: Self-pay | Admitting: Family Medicine

## 2015-03-24 DIAGNOSIS — M48061 Spinal stenosis, lumbar region without neurogenic claudication: Secondary | ICD-10-CM

## 2015-03-24 NOTE — Telephone Encounter (Signed)
Ok with referral. 

## 2015-03-24 NOTE — Telephone Encounter (Signed)
Pt left a message requesting a referral to neurosurgeon Dr. Sherley Bounds. You can reach him @ [phone]

## 2015-03-24 NOTE — Telephone Encounter (Signed)
MD please advise

## 2015-03-25 NOTE — Telephone Encounter (Signed)
Called pt informed provider approved referral to Neurosurgeon states it is for his spinal stenosis.

## 2015-03-25 NOTE — Telephone Encounter (Signed)
Referral placed.

## 2015-03-29 DIAGNOSIS — M48061 Spinal stenosis, lumbar region without neurogenic claudication: Secondary | ICD-10-CM | POA: Insufficient documentation

## 2015-03-29 DIAGNOSIS — M4806 Spinal stenosis, lumbar region: Secondary | ICD-10-CM | POA: Diagnosis not present

## 2015-03-29 DIAGNOSIS — M4316 Spondylolisthesis, lumbar region: Secondary | ICD-10-CM | POA: Diagnosis not present

## 2015-03-29 DIAGNOSIS — M4310 Spondylolisthesis, site unspecified: Secondary | ICD-10-CM | POA: Insufficient documentation

## 2015-04-21 DIAGNOSIS — M4806 Spinal stenosis, lumbar region: Secondary | ICD-10-CM | POA: Diagnosis not present

## 2015-04-21 DIAGNOSIS — M4316 Spondylolisthesis, lumbar region: Secondary | ICD-10-CM | POA: Diagnosis not present

## 2015-05-05 ENCOUNTER — Other Ambulatory Visit: Payer: Medicare Other

## 2015-05-05 ENCOUNTER — Ambulatory Visit (INDEPENDENT_AMBULATORY_CARE_PROVIDER_SITE_OTHER): Payer: Medicare Other | Admitting: *Deleted

## 2015-05-05 DIAGNOSIS — E785 Hyperlipidemia, unspecified: Secondary | ICD-10-CM

## 2015-05-05 DIAGNOSIS — E291 Testicular hypofunction: Secondary | ICD-10-CM

## 2015-05-05 DIAGNOSIS — Z23 Encounter for immunization: Secondary | ICD-10-CM | POA: Diagnosis not present

## 2015-05-05 DIAGNOSIS — Z79899 Other long term (current) drug therapy: Secondary | ICD-10-CM

## 2015-05-05 DIAGNOSIS — M109 Gout, unspecified: Secondary | ICD-10-CM | POA: Diagnosis not present

## 2015-05-05 DIAGNOSIS — Z0000 Encounter for general adult medical examination without abnormal findings: Secondary | ICD-10-CM | POA: Diagnosis not present

## 2015-05-05 DIAGNOSIS — Z125 Encounter for screening for malignant neoplasm of prostate: Secondary | ICD-10-CM | POA: Diagnosis not present

## 2015-05-05 DIAGNOSIS — I10 Essential (primary) hypertension: Secondary | ICD-10-CM

## 2015-05-05 LAB — COMPLETE METABOLIC PANEL WITH GFR
ALBUMIN: 3.6 g/dL (ref 3.6–5.1)
ALK PHOS: 58 U/L (ref 40–115)
ALT: 30 U/L (ref 9–46)
AST: 34 U/L (ref 10–35)
BUN: 23 mg/dL (ref 7–25)
CHLORIDE: 103 mmol/L (ref 98–110)
CO2: 26 mmol/L (ref 20–31)
Calcium: 9 mg/dL (ref 8.6–10.3)
Creat: 1.5 mg/dL — ABNORMAL HIGH (ref 0.70–1.25)
GFR, EST NON AFRICAN AMERICAN: 47 mL/min — AB (ref >=60)
GFR, Est African American: 54 mL/min — ABNORMAL LOW (ref >=60)
GLUCOSE: 93 mg/dL (ref 70–99)
POTASSIUM: 4.4 mmol/L (ref 3.5–5.3)
SODIUM: 139 mmol/L (ref 135–146)
Total Bilirubin: 0.7 mg/dL (ref 0.2–1.2)
Total Protein: 6.2 g/dL (ref 6.1–8.1)

## 2015-05-05 LAB — CBC WITH DIFFERENTIAL/PLATELET
Basophils Absolute: 0.1 10*3/uL (ref 0.0–0.1)
Basophils Relative: 1 % (ref 0–1)
EOS PCT: 2 % (ref 0–5)
Eosinophils Absolute: 0.1 10*3/uL (ref 0.0–0.7)
HEMATOCRIT: 43.1 % (ref 39.0–52.0)
HEMOGLOBIN: 14.3 g/dL (ref 13.0–17.0)
LYMPHS PCT: 43 % (ref 12–46)
Lymphs Abs: 2.3 10*3/uL (ref 0.7–4.0)
MCH: 31.8 pg (ref 26.0–34.0)
MCHC: 33.2 g/dL (ref 30.0–36.0)
MCV: 95.8 fL (ref 78.0–100.0)
MONO ABS: 0.5 10*3/uL (ref 0.1–1.0)
MONOS PCT: 10 % (ref 3–12)
MPV: 10.1 fL (ref 8.6–12.4)
NEUTROS ABS: 2.4 10*3/uL (ref 1.7–7.7)
Neutrophils Relative %: 44 % (ref 43–77)
Platelets: 179 10*3/uL (ref 150–400)
RBC: 4.5 MIL/uL (ref 4.22–5.81)
RDW: 13.3 % (ref 11.5–15.5)
WBC: 5.4 10*3/uL (ref 4.0–10.5)

## 2015-05-05 LAB — LIPID PANEL
CHOL/HDL RATIO: 3.9 ratio (ref <=5.0)
Cholesterol: 203 mg/dL — ABNORMAL HIGH (ref 125–200)
HDL: 52 mg/dL (ref >=40)
LDL CALC: 127 mg/dL (ref <130)
TRIGLYCERIDES: 121 mg/dL (ref <150)
VLDL: 24 mg/dL (ref <30)

## 2015-05-05 LAB — TSH: TSH: 3.08 u[IU]/mL (ref 0.350–4.500)

## 2015-05-05 NOTE — Progress Notes (Signed)
Patient ID: Chad Serrano, male   DOB: 08-08-1945, 69 y.o.   MRN: [id number]  Patient seen in office for Influenza Vaccination.   Tolerated IM administration well.   Immunization history updated.

## 2015-05-06 LAB — TESTOSTERONE: Testosterone: 303 ng/dL (ref 300–890)

## 2015-05-06 LAB — HEMOGLOBIN A1C
Hgb A1c MFr Bld: 5.8 % — ABNORMAL HIGH (ref <5.7)
Mean Plasma Glucose: 120 mg/dL — ABNORMAL HIGH (ref <117)

## 2015-05-06 LAB — PSA: PSA: 0.79 ng/mL (ref <=4.00)

## 2015-05-09 ENCOUNTER — Ambulatory Visit (INDEPENDENT_AMBULATORY_CARE_PROVIDER_SITE_OTHER): Payer: Medicare Other | Admitting: Family Medicine

## 2015-05-09 ENCOUNTER — Encounter: Payer: Self-pay | Admitting: Family Medicine

## 2015-05-09 VITALS — BP 112/68 | HR 84 | Temp 98.5°F | Resp 14 | Ht 70.5 in | Wt 235.0 lb

## 2015-05-09 DIAGNOSIS — Z0000 Encounter for general adult medical examination without abnormal findings: Secondary | ICD-10-CM

## 2015-05-09 DIAGNOSIS — R0789 Other chest pain: Secondary | ICD-10-CM | POA: Diagnosis not present

## 2015-05-09 MED ORDER — ALPRAZOLAM 0.5 MG PO TABS: 0.5000 mg | ORAL_TABLET | Freq: Three times a day (TID) | ORAL | Status: DC | PRN

## 2015-05-09 NOTE — Progress Notes (Signed)
Subjective:    Patient ID: Chad Serrano, male    DOB: 05/16/1946, 69 y.o.   MRN: [id number]  HPI Here for CPE.  Last colonoscopy is 01/2008.  Immunizations are up-to-date. The patient's insurance will not cover the shingles vaccine and therefore he declines it.  However recently he has been having unusual pains in his left chest. The pains will last just a few seconds to a minute. He describes it as a burning tingling pain. It is located in the left upper chest. It is unrelated to exercise. He denies any dyspnea on exertion or shortness of breath. He performs cardio exercise 5 days a week with no decrease in exercise tolerance or chest pain. EKG today shows normal sinus rhythm with normal intervals and normal axis with no evidence of ischemia or infarction. He also denies any cough or hemoptysis. He does report his family has been under increasing stress recently. He does not go into detail but he states that he is having difficult time sleeping. He notices that whenever his stress is higher, he notices unusual pain more often. He believes it could be stress causing it. L ab on 05/05/2015  Component Date Value Ref Range Status  . Sodium 05/05/2015 139  135 - 146 mmol/L Final  . Potassium 05/05/2015 4.4  3.5 - 5.3 mmol/L Final  . Chloride 05/05/2015 103  98 - 110 mmol/L Final  . CO2 05/05/2015 26  20 - 31 mmol/L Final  . Glucose, Bld 05/05/2015 93  70 - 99 mg/dL Final  . BUN 05/05/2015 23  7 - 25 mg/dL Final  . Creat 05/05/2015 1.50* 0.70 - 1.25 mg/dL Final  . Total Bilirubin 05/05/2015 0.7  0.2 - 1.2 mg/dL Final  . Alkaline Phosphatase 05/05/2015 58  40 - 115 U/L Final  . AST 05/05/2015 34  10 - 35 U/L Final  . ALT 05/05/2015 30  9 - 46 U/L Final  . Total Protein 05/05/2015 6.2  6.1 - 8.1 g/dL Final  . Albumin 05/05/2015 3.6  3.6 - 5.1 g/dL Final  . Calcium 05/05/2015 9.0  8.6 - 10.3 mg/dL Final  . GFR, Est African American 05/05/2015 54* >=60 mL/min Final  . GFR, Est Non African American  05/05/2015 47* >=60 mL/min Final   Comment:   The estimated GFR is a calculation valid for adults (>=52 years old) that uses the CKD-EPI algorithm to adjust for age and sex. It is   not to be used for children, pregnant women, hospitalized patients,    patients on dialysis, or with rapidly changing kidney function. According to the NKDEP, eGFR >89 is normal, 60-89 shows mild impairment, 30-59 shows moderate impairment, 15-29 shows severe impairment and <15 is ESRD.     Marland Kitchen TSH 05/05/2015 3.080  0.350 - 4.500 uIU/mL Final  . Cholesterol 05/05/2015 203* 125 - 200 mg/dL Final  . Triglycerides 05/05/2015 121  <150 mg/dL Final  . HDL 05/05/2015 52  >=40 mg/dL Final  . Total CHOL/HDL Ratio 05/05/2015 3.9  <=5.0 Ratio Final  . VLDL 05/05/2015 24  <30 mg/dL Final  . LDL Cholesterol 05/05/2015 127  <130 mg/dL Final   Comment:   Total Cholesterol/HDL Ratio:CHD Risk                        Coronary Heart Disease Risk Table  Men       Women          1/2 Average Risk              3.4        3.3              Average Risk              5.0        4.4           2X Average Risk              9.6        7.1           3X Average Risk             23.4       11.0 Use the calculated Patient Ratio above and the CHD Risk table  to determine the patient's CHD Risk.   . WBC 05/05/2015 5.4  4.0 - 10.5 K/uL Final  . RBC 05/05/2015 4.50  4.22 - 5.81 MIL/uL Final  . Hemoglobin 05/05/2015 14.3  13.0 - 17.0 g/dL Final  . HCT 05/05/2015 43.1  39.0 - 52.0 % Final  . MCV 05/05/2015 95.8  78.0 - 100.0 fL Final  . MCH 05/05/2015 31.8  26.0 - 34.0 pg Final  . MCHC 05/05/2015 33.2  30.0 - 36.0 g/dL Final  . RDW 05/05/2015 13.3  11.5 - 15.5 % Final  . Platelets 05/05/2015 179  150 - 400 K/uL Final  . MPV 05/05/2015 10.1  8.6 - 12.4 fL Final  . Neutrophils Relative % 05/05/2015 44  43 - 77 % Final  . Neutro Abs 05/05/2015 2.4  1.7 - 7.7 K/uL Final  . Lymphocytes Relative 05/05/2015  43  12 - 46 % Final  . Lymphs Abs 05/05/2015 2.3  0.7 - 4.0 K/uL Final  . Monocytes Relative 05/05/2015 10  3 - 12 % Final  . Monocytes Absolute 05/05/2015 0.5  0.1 - 1.0 K/uL Final  . Eosinophils Relative 05/05/2015 2  0 - 5 % Final  . Eosinophils Absolute 05/05/2015 0.1  0.0 - 0.7 K/uL Final  . Basophils Relative 05/05/2015 1  0 - 1 % Final  . Basophils Absolute 05/05/2015 0.1  0.0 - 0.1 K/uL Final  . Smear Review 05/05/2015 Criteria for review not met   Final  . Hgb A1c MFr Bld 05/05/2015 5.8* <5.7 % Final   Comment:                                                                        According to the ADA Clinical Practice Recommendations for 2011, when HbA1c is used as a screening test:     >=6.5%   Diagnostic of Diabetes Mellitus            (if abnormal result is confirmed)   5.7-6.4%   Increased risk of developing Diabetes Mellitus   References:Diagnosis and Classification of Diabetes Mellitus,Diabetes LPFX,9024,09(BDZHG 1):S62-S69 and Standards of Medical Care in         Diabetes - 2011,Diabetes DJME,2683,41 (Suppl 1):S11-S61.     . Mean Plasma Glucose 05/05/2015 120* <117 mg/dL Final  . PSA 05/05/2015 0.79  <=  4.00 ng/mL Final   Comment: Test Methodology: ECLIA PSA (Electrochemiluminescence Immunoassay)   For PSA values from 2.5-4.0, particularly in younger men <27 years old, the AUA and NCCN suggest testing for % Free PSA (3515) and evaluation of the rate of increase in PSA (PSA velocity).   . Testosterone 05/05/2015 303  300 - 890 ng/dL Final   Comment:           Tanner Stage       Male              Male               I              < 30 ng/dL        < 10 ng/dL               II             < 150 ng/dL       < 30 ng/dL               III            100-320 ng/dL     < 35 ng/dL               IV             200-970 ng/dL     15-40 ng/dL               V/Adult        300-890 ng/dL     10-70 ng/dL       Immunization History  Administered Date(s) Administered  .  Hepatitis A 11/29/2003, 08/08/2009  . Hepatitis B 08/08/2009, 09/13/2009  . IPV 05/29/1965  . Influenza Split 04/28/2012, 04/13/2013  . Influenza Whole 02/16/2011  . Influenza,inj,Quad PF,36+ Mos 04/30/2014, 05/05/2015  . Plague 03/28/1966  . Pneumococcal Conjugate-13 05/11/2013  . Pneumococcal Polysaccharide-23 04/30/2014  . Td 04/28/2012  . Typhoid Parenteral 01/27/1965, 08/08/2009   Past Medical History  Diagnosis Date  . Hypertension   . Hypogonadism male   . Anxiety   . Gout   . Hyperlipidemia   . Hypogonadism male    Past Surgical History  Procedure Laterality Date  . Appendectomy    . Rotator cuff repair     Current Outpatient Prescriptions on File Prior to Visit  Medication Sig Dispense Refill  . allopurinol (ZYLOPRIM) 100 MG tablet TAKE 2 TABLETS (200 MG TOTAL) BY MOUTH DAILY. 180 tablet 1  . losartan (COZAAR) 100 MG tablet TAKE 1 TABLET EVERY DAY 90 tablet 3  . sildenafil (VIAGRA) 100 MG tablet Take 100 mg by mouth daily as needed.      . vardenafil (LEVITRA) 10 MG tablet Take 1 tablet (10 mg total) by mouth daily as needed for erectile dysfunction. 10 tablet 11   No current facility-administered medications on file prior to visit.   No Known Allergies Social History   Social History  . Marital Status: Married    Spouse Name: N/A  . Number of Children: N/A  . Years of Education: N/A   Occupational History  . Not on file.   Social History Main Topics  . Smoking status: Never Smoker   . Smokeless tobacco: Never Used  . Alcohol Use: No  . Drug Use: No  . 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 Not on file   Social History Narrative   No family history on file.    Review of Systems  All other systems reviewed and are negative.      Objective:   Physical Exam  Constitutional: He is oriented to person, place, and time. He appears well-developed and well-nourished. No  distress.  HENT:  Head: Normocephalic and atraumatic.  Right Ear: External ear normal.  Left Ear: External ear normal.  Nose: Nose normal.  Mouth/Throat: Oropharynx is clear and moist. No oropharyngeal exudate.  Eyes: Conjunctivae and EOM are normal. Pupils are equal, round, and reactive to light. Right eye exhibits no discharge. Left eye exhibits no discharge. No scleral icterus.  Neck: Normal range of motion. Neck supple. No JVD present. No tracheal deviation present. No thyromegaly present.  Cardiovascular: Normal rate, regular rhythm, normal heart sounds and intact distal pulses.  Exam reveals no gallop and no friction rub.   No murmur heard. Pulmonary/Chest: Effort normal and breath sounds normal. No stridor. No respiratory distress. He has no wheezes. He has no rales. He exhibits no tenderness.  Abdominal: Soft. Bowel sounds are normal. He exhibits no distension and no mass. There is no tenderness. There is no rebound and no guarding.  Genitourinary: Rectum normal and prostate normal.  Musculoskeletal: Normal range of motion. He exhibits no edema or tenderness.  Lymphadenopathy:    He has no cervical adenopathy.  Neurological: He is alert and oriented to person, place, and time. He has normal reflexes. He displays normal reflexes. No cranial nerve deficit. He exhibits normal muscle tone. Coordination normal.  Skin: Skin is warm. No rash noted. He is not diaphoretic. No erythema. No pallor.  Psychiatric: He has a normal mood and affect. His behavior is normal. Judgment and thought content normal.  Vitals reviewed.         Assessment & Plan:  Routine general medical examination at a health care facility  Atypical chest pain - Plan: DG Chest 2 View, ALPRAZolam (XANAX) 0.5 MG tablet, EKG 12-Lead   patient's physical exam today is normal as is his EKG. Lab work is excellent except for slight elevation in his creatinine. However this is a very muscular individual which I believe  accounts partially for the increasing creatinine. I have asked him to avoid NSAIDs and try to drink more fluids and return in 2 weeks to recheck it to see if it will normalize. Immunizations and cancer screening up-to-date. I believe the chest pain is likely anxiety related. EKG is normal. I will have the patient go for a chest x-ray. I did give the patient a prescription for Xanax 0.5 mg tablets. He can take 1 every 8 hours as needed for anxiety. If he notices that the Xanax does seem to help with his atypical chest pain, we could discuss further ways to help prevent anxiety. I only gave him 10 Xanax, more as a test to see if it is anxiety related. If the Does not help the chest discomfort, we may need to broaden our differential and evaluate further.

## 2015-05-10 ENCOUNTER — Ambulatory Visit
Admission: RE | Admit: 2015-05-10 | Discharge: 2015-05-10 | Disposition: A | Discharge status: Home / Self Care | Payer: Medicare Other | Source: Ambulatory Visit | Attending: Family Medicine | Admitting: Family Medicine

## 2015-05-10 ENCOUNTER — Encounter: Payer: Self-pay | Admitting: Family Medicine

## 2015-05-10 DIAGNOSIS — R0789 Other chest pain: Secondary | ICD-10-CM

## 2015-05-10 DIAGNOSIS — R079 Chest pain, unspecified: Secondary | ICD-10-CM | POA: Diagnosis not present

## 2015-05-12 ENCOUNTER — Encounter: Payer: Self-pay | Admitting: Family Medicine

## 2015-05-12 DIAGNOSIS — M4806 Spinal stenosis, lumbar region: Secondary | ICD-10-CM | POA: Diagnosis not present

## 2015-05-12 DIAGNOSIS — M4316 Spondylolisthesis, lumbar region: Secondary | ICD-10-CM | POA: Diagnosis not present

## 2015-05-26 ENCOUNTER — Ambulatory Visit (INDEPENDENT_AMBULATORY_CARE_PROVIDER_SITE_OTHER): Payer: Medicare Other | Admitting: Family Medicine

## 2015-05-26 ENCOUNTER — Encounter: Payer: Self-pay | Admitting: Family Medicine

## 2015-05-26 VITALS — BP 122/70 | HR 94 | Temp 98.9°F | Resp 18 | Ht 70.5 in | Wt 230.0 lb

## 2015-05-26 DIAGNOSIS — J019 Acute sinusitis, unspecified: Secondary | ICD-10-CM

## 2015-05-26 MED ORDER — AMOXICILLIN-POT CLAVULANATE 875-125 MG PO TABS: 1.0000 | ORAL_TABLET | Freq: Two times a day (BID) | ORAL | Status: DC

## 2015-05-26 NOTE — Progress Notes (Signed)
Subjective:    Patient ID: Chad Serrano, male    DOB: January 23, 1946, 70 y.o.   MRN: [id number]  HPI  symptoms began more than 2-1/2 weeks ago. Symptoms have gradually gotten worse and now he has severe headache in his frontal sinuses. He has tenderness to palpation in the frontal sinuses. He has terrible postnasal drip. He has a constant headache. He reports subjective fevers. He reports pain in his teeth and diffuse body aches. Past Medical History  Diagnosis Date  . Hypertension   . Hypogonadism male   . Anxiety   . Gout   . Hyperlipidemia   . Hypogonadism male    Past Surgical History  Procedure Laterality Date  . Appendectomy    . Rotator cuff repair     Current Outpatient Prescriptions on File Prior to Visit  Medication Sig Dispense Refill  . allopurinol (ZYLOPRIM) 100 MG tablet TAKE 2 TABLETS (200 MG TOTAL) BY MOUTH DAILY. 180 tablet 1  . ALPRAZolam (XANAX) 0.5 MG tablet Take 1 tablet (0.5 mg total) by mouth 3 (three) times daily as needed for anxiety. 10 tablet 0  . aspirin 81 MG tablet Take 81 mg by mouth daily.    . Cholecalciferol (VITAMIN D3) 2000 UNITS TABS Take by mouth.    Marland Kitchen L-Arginine 500 MG TABS Take by mouth.    . losartan (COZAAR) 100 MG tablet TAKE 1 TABLET EVERY DAY 90 tablet 3  . Magnesium 500 MG CAPS Take by mouth.    . Multiple Vitamins-Minerals (CENTRUM SILVER ADULT 50+) TABS Take by mouth.    . sildenafil (VIAGRA) 100 MG tablet Take 100 mg by mouth daily as needed.      . traMADol (ULTRAM) 50 MG tablet Take 50 mg by mouth every 12 (twelve) hours as needed for moderate pain.    . vardenafil (LEVITRA) 10 MG tablet Take 1 tablet (10 mg total) by mouth daily as needed for erectile dysfunction. 10 tablet 11  . vitamin B-12 (CYANOCOBALAMIN) 1000 MCG tablet Take 3,000 mcg by mouth daily.    . Zinc 50 MG TABS Take by mouth.     No current facility-administered medications on file prior to visit.   No Known Allergies Social History   Social History  .  Marital Status: Married    Spouse Name: N/A  . Number of Children: N/A  . Years of Education: N/A   Occupational History  . Not on file.   Social History Main Topics  . Smoking status: Never Smoker   . Smokeless tobacco: Never Used  . Alcohol Use: No  . Drug Use: No  . 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 Not on file   Social History Narrative      Review of Systems  All other systems reviewed and are negative.      Objective:   Physical Exam  HENT:  Right Ear: External ear normal.  Left Ear: External ear normal.  Nose: Mucosal edema and rhinorrhea present. Right sinus exhibits frontal sinus tenderness. Left sinus exhibits frontal sinus tenderness.  Mouth/Throat: Oropharynx is clear and moist. No oropharyngeal exudate.  Eyes: Conjunctivae are normal.  Neck: Neck supple.  Cardiovascular: Normal rate, regular rhythm and normal heart sounds.   Pulmonary/Chest: Effort normal and breath sounds normal. No respiratory distress. He has no wheezes. He has no rales.  Lymphadenopathy:    He has no cervical adenopathy.  Vitals reviewed.         Assessment & Plan:  Acute rhinosinusitis - Plan: amoxicillin-clavulanate (AUGMENTIN) 875-125 MG tablet   Begin Augmentin 875 mg by mouth twice a day for 10 days. Recheck in 2 weeks if not completely better

## 2015-06-13 DIAGNOSIS — M4316 Spondylolisthesis, lumbar region: Secondary | ICD-10-CM | POA: Diagnosis not present

## 2015-06-13 DIAGNOSIS — Z6832 Body mass index (BMI) 32.0-32.9, adult: Secondary | ICD-10-CM | POA: Diagnosis not present

## 2015-06-25 ENCOUNTER — Other Ambulatory Visit: Payer: Self-pay | Admitting: Family Medicine

## 2015-07-25 DIAGNOSIS — M4316 Spondylolisthesis, lumbar region: Secondary | ICD-10-CM | POA: Diagnosis not present

## 2015-07-25 DIAGNOSIS — M4806 Spinal stenosis, lumbar region: Secondary | ICD-10-CM | POA: Diagnosis not present

## 2015-08-30 DIAGNOSIS — M4806 Spinal stenosis, lumbar region: Secondary | ICD-10-CM | POA: Diagnosis not present

## 2015-08-30 DIAGNOSIS — M4316 Spondylolisthesis, lumbar region: Secondary | ICD-10-CM | POA: Diagnosis not present

## 2015-09-05 ENCOUNTER — Ambulatory Visit (INDEPENDENT_AMBULATORY_CARE_PROVIDER_SITE_OTHER): Payer: Medicare Other | Admitting: Family Medicine

## 2015-09-05 ENCOUNTER — Encounter: Payer: Self-pay | Admitting: Family Medicine

## 2015-09-05 VITALS — BP 132/70 | HR 86 | Temp 98.5°F | Resp 18 | Ht 70.5 in | Wt 231.0 lb

## 2015-09-05 DIAGNOSIS — J019 Acute sinusitis, unspecified: Secondary | ICD-10-CM | POA: Diagnosis not present

## 2015-09-05 MED ORDER — AMOXICILLIN-POT CLAVULANATE 875-125 MG PO TABS: 1.0000 | ORAL_TABLET | Freq: Two times a day (BID) | ORAL | Status: DC

## 2015-09-05 NOTE — Progress Notes (Signed)
Subjective:    Patient ID: Chad Serrano, male    DOB: April 28, 1946, 70 y.o.   MRN: [id number]  HPI Symptoms began approximately 10 days ago. He reports pain and pressure in his frontal and maxillary sinuses. He reports a low-grade fever and persistent headache. He's been trying decongestants without any relief. He reports the majority of the pain in the frontal sinuses but also pain in the maxillary sinuses. He reports pain in his teeth. He reports persistent rhinorrhea. Symptoms are similar to what he experienced in January Past Medical History  Diagnosis Date  . Hypertension   . Hypogonadism male   . Anxiety   . Gout   . Hyperlipidemia   . Hypogonadism male    Past Surgical History  Procedure Laterality Date  . Appendectomy    . Rotator cuff repair     Current Outpatient Prescriptions on File Prior to Visit  Medication Sig Dispense Refill  . allopurinol (ZYLOPRIM) 100 MG tablet TAKE 2 TABLETS (200 MG TOTAL) BY MOUTH DAILY. 180 tablet 1  . ALPRAZolam (XANAX) 0.5 MG tablet Take 1 tablet (0.5 mg total) by mouth 3 (three) times daily as needed for anxiety. 10 tablet 0  . aspirin 81 MG tablet Take 81 mg by mouth daily.    . Cholecalciferol (VITAMIN D3) 2000 UNITS TABS Take by mouth.    Marland Kitchen L-Arginine 500 MG TABS Take by mouth.    . losartan (COZAAR) 100 MG tablet TAKE 1 TABLET EVERY DAY 90 tablet 3  . Magnesium 500 MG CAPS Take by mouth.    . Multiple Vitamins-Minerals (CENTRUM SILVER ADULT 50+) TABS Take by mouth.    . sildenafil (VIAGRA) 100 MG tablet Take 100 mg by mouth daily as needed.      . traMADol (ULTRAM) 50 MG tablet Take 50 mg by mouth every 12 (twelve) hours as needed for moderate pain.    . vardenafil (LEVITRA) 10 MG tablet Take 1 tablet (10 mg total) by mouth daily as needed for erectile dysfunction. 10 tablet 11  . vitamin B-12 (CYANOCOBALAMIN) 1000 MCG tablet Take 3,000 mcg by mouth daily.    . Zinc 50 MG TABS Take by mouth.     No current facility-administered  medications on file prior to visit.   No Known Allergies Social History   Social History  . Marital Status: Married    Spouse Name: N/A  . Number of Children: N/A  . Years of Education: N/A   Occupational History  . Not on file.   Social History Main Topics  . Smoking status: Never Smoker   . Smokeless tobacco: Never Used  . Alcohol Use: No  . Drug Use: No  . 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 Not on file   Social History Narrative      Review of Systems  All other systems reviewed and are negative.      Objective:   Physical Exam  Constitutional: He appears well-developed and well-nourished.  HENT:  Right Ear: Tympanic membrane, external ear and ear canal normal.  Left Ear: Tympanic membrane, external ear and ear canal normal.  Nose: Mucosal edema and rhinorrhea present. Right sinus exhibits maxillary sinus tenderness and frontal sinus tenderness. Left sinus exhibits maxillary sinus tenderness and frontal sinus tenderness.  Mouth/Throat: Oropharynx is clear and moist.  Cardiovascular: Normal rate, regular rhythm and normal heart sounds.   Pulmonary/Chest: Effort  normal and breath sounds normal. No respiratory distress. He has no wheezes. He has no rales.          Assessment & Plan:  Acute rhinosinusitis - Plan: amoxicillin-clavulanate (AUGMENTIN) 875-125 MG tablet  Begin Augmentin 875 mg by mouth twice a day. Also begin taking Flonase and I would like him to stay on this for the duration of allergy season

## 2015-10-10 DIAGNOSIS — L218 Other seborrheic dermatitis: Secondary | ICD-10-CM | POA: Diagnosis not present

## 2015-10-10 DIAGNOSIS — L814 Other melanin hyperpigmentation: Secondary | ICD-10-CM | POA: Diagnosis not present

## 2015-10-10 DIAGNOSIS — L400 Psoriasis vulgaris: Secondary | ICD-10-CM | POA: Diagnosis not present

## 2015-10-20 ENCOUNTER — Other Ambulatory Visit: Payer: Self-pay | Admitting: Family Medicine

## 2015-10-20 NOTE — Telephone Encounter (Signed)
Refill appropriate and filled per protocol. 

## 2015-11-01 DIAGNOSIS — M4316 Spondylolisthesis, lumbar region: Secondary | ICD-10-CM | POA: Diagnosis not present

## 2015-11-01 DIAGNOSIS — M4806 Spinal stenosis, lumbar region: Secondary | ICD-10-CM | POA: Diagnosis not present

## 2015-12-27 ENCOUNTER — Other Ambulatory Visit: Payer: Self-pay | Admitting: Family Medicine

## 2015-12-27 NOTE — Telephone Encounter (Signed)
Refill appropriate and filled per protocol. 

## 2016-01-26 DIAGNOSIS — M4316 Spondylolisthesis, lumbar region: Secondary | ICD-10-CM | POA: Diagnosis not present

## 2016-01-26 DIAGNOSIS — M4806 Spinal stenosis, lumbar region: Secondary | ICD-10-CM | POA: Diagnosis not present

## 2016-03-13 ENCOUNTER — Ambulatory Visit (INDEPENDENT_AMBULATORY_CARE_PROVIDER_SITE_OTHER): Payer: Medicare Other

## 2016-03-13 DIAGNOSIS — Z23 Encounter for immunization: Secondary | ICD-10-CM

## 2016-03-30 ENCOUNTER — Ambulatory Visit (INDEPENDENT_AMBULATORY_CARE_PROVIDER_SITE_OTHER): Payer: Medicare Other | Admitting: Family Medicine

## 2016-03-30 ENCOUNTER — Encounter: Payer: Self-pay | Admitting: Family Medicine

## 2016-03-30 VITALS — BP 108/72 | HR 92 | Temp 98.5°F | Resp 16 | Ht 70.5 in | Wt 235.0 lb

## 2016-03-30 DIAGNOSIS — M545 Low back pain, unspecified: Secondary | ICD-10-CM

## 2016-03-30 DIAGNOSIS — W19XXXA Unspecified fall, initial encounter: Secondary | ICD-10-CM

## 2016-03-30 LAB — URINALYSIS, ROUTINE W REFLEX MICROSCOPIC
BILIRUBIN URINE: NEGATIVE
GLUCOSE, UA: NEGATIVE
Hgb urine dipstick: NEGATIVE
Ketones, ur: NEGATIVE
Leukocytes, UA: NEGATIVE
Nitrite: NEGATIVE
Protein, ur: NEGATIVE
SPECIFIC GRAVITY, URINE: 1.015 (ref 1.001–1.035)
pH: 7 (ref 5.0–8.0)

## 2016-03-30 MED ORDER — PREDNISONE 20 MG PO TABS: ORAL_TABLET | ORAL | 0 refills | Status: DC

## 2016-03-30 NOTE — Progress Notes (Signed)
Subjective:    Patient ID: Chad Serrano, male    DOB: [birth year], 70 y.o.   MRN: [id number]  HPI [place] two weeks ago.  Now with left lower flank pain radiating to his lower abdomen and into his testicles and groin on the left side.This is following the L1-L2 dermatomes of note on MRI of the lumbar spine as the patient had 2015 he did have a bulging disc between L1-L2. Is possible that he aggravated this when he fell. The patient fell off the back of his truck hyperflexed his lower back as he rolled into a ball and landed on the ground. The other possibility would be a kidney stone. However his urinalysis today shows no blood which I believe rules out a kidney laceration/bruise kidney and kidney stone.  Past Medical History:  Diagnosis Date  . Anxiety   . Gout   . Hyperlipidemia   . Hypertension   . Hypogonadism male   . Hypogonadism male    Past Surgical History:  Procedure Laterality Date  . APPENDECTOMY    . ROTATOR CUFF REPAIR     Current Outpatient Prescriptions on File Prior to Visit  Medication Sig Dispense Refill  . allopurinol (ZYLOPRIM) 100 MG tablet TAKE 2 TABLETS (200 MG TOTAL) BY MOUTH DAILY. 180 tablet 1  . ALPRAZolam (XANAX) 0.5 MG tablet Take 1 tablet (0.5 mg total) by mouth 3 (three) times daily as needed for anxiety. 10 tablet 0  . amoxicillin-clavulanate (AUGMENTIN) 875-125 MG tablet Take 1 tablet by mouth 2 (two) times daily. 20 tablet 0  . aspirin 81 MG tablet Take 81 mg by mouth daily.    . Cholecalciferol (VITAMIN D3) 2000 UNITS TABS Take by mouth.    Marland Kitchen L-Arginine 500 MG TABS Take by mouth.    . losartan (COZAAR) 100 MG tablet TAKE 1 TABLET EVERY DAY 90 tablet 3  . Magnesium 500 MG CAPS Take by mouth.    . Multiple Vitamins-Minerals (CENTRUM SILVER ADULT 50+) TABS Take by mouth.    . sildenafil (VIAGRA) 100 MG tablet Take 100 mg by mouth daily as needed.      . traMADol (ULTRAM) 50 MG tablet Take 50 mg by mouth every 12 (twelve) hours as needed for moderate  pain.    . vardenafil (LEVITRA) 10 MG tablet Take 1 tablet (10 mg total) by mouth daily as needed for erectile dysfunction. 10 tablet 11  . vitamin B-12 (CYANOCOBALAMIN) 1000 MCG tablet Take 3,000 mcg by mouth daily.    . Zinc 50 MG TABS Take by mouth.     No current facility-administered medications on file prior to visit.    No Known Allergies Social History   Social History  . Marital status: Married    Spouse name: N/A  . Number of children: N/A  . Years of education: N/A   Occupational History  . Not on file.   Social History Main Topics  . Smoking status: Never Smoker  . Smokeless tobacco: Never Used  . Alcohol use No  . Drug use: No  . 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 Not on file   Social History Narrative  . No narrative on file      Review of Systems  All other systems reviewed and are negative.      Objective:   Physical Exam  Constitutional: He appears well-developed and well-nourished.  HENT:  Head: Normocephalic and atraumatic.  Neck: Normal range of motion. Neck supple.  Cardiovascular: Normal rate, regular rhythm and normal heart sounds.   Pulmonary/Chest: Effort normal and breath sounds normal. No respiratory distress. He has no wheezes. He has no rales.  Abdominal: Soft. Bowel sounds are normal. He exhibits no distension and no mass. There is no tenderness. There is no rebound and no guarding. Hernia confirmed negative in the right inguinal area and confirmed negative in the left inguinal area.  Genitourinary: Testes normal.  Lymphadenopathy:       Right: No inguinal adenopathy present.       Left: No inguinal adenopathy present.          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 Begin prednisone taper pack. Recheck next week if no better or sooner if worse. The patient develops hematuria or intense abdominal pain, I would want to get a CT scan of the abdomen and pelvis immediately

## 2016-04-30 DIAGNOSIS — M48061 Spinal stenosis, lumbar region without neurogenic claudication: Secondary | ICD-10-CM | POA: Diagnosis not present

## 2016-05-18 DIAGNOSIS — H524 Presbyopia: Secondary | ICD-10-CM | POA: Diagnosis not present

## 2016-05-18 DIAGNOSIS — H2513 Age-related nuclear cataract, bilateral: Secondary | ICD-10-CM | POA: Diagnosis not present

## 2016-05-18 DIAGNOSIS — H52223 Regular astigmatism, bilateral: Secondary | ICD-10-CM | POA: Diagnosis not present

## 2016-05-18 DIAGNOSIS — H5203 Hypermetropia, bilateral: Secondary | ICD-10-CM | POA: Diagnosis not present

## 2016-06-04 ENCOUNTER — Ambulatory Visit (INDEPENDENT_AMBULATORY_CARE_PROVIDER_SITE_OTHER): Payer: Medicare Other | Admitting: Family Medicine

## 2016-06-04 ENCOUNTER — Encounter: Payer: Self-pay | Admitting: Family Medicine

## 2016-06-04 VITALS — BP 124/86 | HR 96 | Temp 99.2°F | Resp 18 | Wt 237.0 lb

## 2016-06-04 DIAGNOSIS — J019 Acute sinusitis, unspecified: Secondary | ICD-10-CM | POA: Diagnosis not present

## 2016-06-04 MED ORDER — AMOXICILLIN-POT CLAVULANATE 875-125 MG PO TABS: 1.0000 | ORAL_TABLET | Freq: Two times a day (BID) | ORAL | 0 refills | Status: DC

## 2016-06-04 NOTE — Progress Notes (Signed)
Subjective:    Patient ID: Chad Serrano, male    DOB: [birth year], 71 y.o.   MRN: [id number]  HPI Been going on for 5 weeks. Patient reports pain in the maxillary sinuses bilaterally as well as the frontal sinuses bilaterally. He reports headache constant postnasal drip constantly clearing his throat. He reports fevers. He has a little bit of a cough but the majority of his symptoms are severe head congestion and rhinorrhea.  Past Medical History:  Diagnosis Date  . Anxiety   . Gout   . Hyperlipidemia   . Hypertension   . Hypogonadism male   . Hypogonadism male    Past Surgical History:  Procedure Laterality Date  . APPENDECTOMY    . ROTATOR CUFF REPAIR     Current Outpatient Prescriptions on File Prior to Visit  Medication Sig Dispense Refill  . allopurinol (ZYLOPRIM) 100 MG tablet TAKE 2 TABLETS (200 MG TOTAL) BY MOUTH DAILY. 180 tablet 1  . ALPRAZolam (XANAX) 0.5 MG tablet Take 1 tablet (0.5 mg total) by mouth 3 (three) times daily as needed for anxiety. 10 tablet 0  . aspirin 81 MG tablet Take 81 mg by mouth daily.    . Cholecalciferol (VITAMIN D3) 2000 UNITS TABS Take by mouth.    Marland Kitchen L-Arginine 500 MG TABS Take by mouth.    . losartan (COZAAR) 100 MG tablet TAKE 1 TABLET EVERY DAY 90 tablet 3  . Magnesium 500 MG CAPS Take by mouth.    . Multiple Vitamins-Minerals (CENTRUM SILVER ADULT 50+) TABS Take by mouth.    . traMADol (ULTRAM) 50 MG tablet Take 50 mg by mouth every 12 (twelve) hours as needed for moderate pain.    . vitamin B-12 (CYANOCOBALAMIN) 1000 MCG tablet Take 3,000 mcg by mouth daily.    . Zinc 50 MG TABS Take by mouth.    . sildenafil (VIAGRA) 100 MG tablet Take 100 mg by mouth daily as needed.      . vardenafil (LEVITRA) 10 MG tablet Take 1 tablet (10 mg total) by mouth daily as needed for erectile dysfunction. (Patient not taking: Reported on 06/04/2016) 10 tablet 11   No current facility-administered medications on file prior to visit.    No Known  Allergies Social History   Social History  . Marital status: Married    Spouse name: N/A  . Number of children: N/A  . Years of education: N/A   Occupational History  . Not on file.   Social History Main Topics  . Smoking status: Never Smoker  . Smokeless tobacco: Never Used  . Alcohol use No  . Drug use: No  . 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 Not on file   Social History Narrative  . No narrative on file    Review of Systems  All other systems reviewed and are negative.      Objective:   Physical Exam  Constitutional: He appears well-developed and well-nourished.  HENT:  Right Ear: External ear normal.  Left Ear: External ear normal.  Nose: Mucosal edema and rhinorrhea present. Right sinus exhibits maxillary sinus tenderness and frontal sinus tenderness. Left sinus exhibits maxillary sinus tenderness and frontal sinus tenderness.  Mouth/Throat: Oropharynx is clear and moist. No oropharyngeal exudate.  Eyes: Conjunctivae are normal.  Neck: Neck supple.  Cardiovascular: Normal rate, regular rhythm and normal heart sounds.   Pulmonary/Chest: Effort normal and  breath sounds normal. No respiratory distress. He has no wheezes. He has no rales.  Vitals reviewed.         Assessment & Plan:  Acute rhinosinusitis  Augmentin 875 mg by mouth twice a day for 10 days. Use Flonase 2 sprays each nostril daily. Use Afrin to get the swelling down for the next 3 days. Use nasal saline 3-4 times a day. If symptoms are not improving we'll have to do oral steroids but I'm trying to avoid that because the frequency receives cortisone shots in his back

## 2016-06-27 ENCOUNTER — Other Ambulatory Visit: Payer: Medicare Other

## 2016-06-27 DIAGNOSIS — I10 Essential (primary) hypertension: Secondary | ICD-10-CM | POA: Diagnosis not present

## 2016-06-27 DIAGNOSIS — E291 Testicular hypofunction: Secondary | ICD-10-CM | POA: Diagnosis not present

## 2016-06-27 DIAGNOSIS — F419 Anxiety disorder, unspecified: Secondary | ICD-10-CM | POA: Diagnosis not present

## 2016-06-27 DIAGNOSIS — Z79899 Other long term (current) drug therapy: Secondary | ICD-10-CM | POA: Diagnosis not present

## 2016-06-27 DIAGNOSIS — E785 Hyperlipidemia, unspecified: Secondary | ICD-10-CM

## 2016-06-27 DIAGNOSIS — Z0000 Encounter for general adult medical examination without abnormal findings: Secondary | ICD-10-CM | POA: Diagnosis not present

## 2016-06-27 LAB — CBC WITH DIFFERENTIAL/PLATELET
BASOS PCT: 1 %
Basophils Absolute: 59 cells/uL (ref 0–200)
EOS ABS: 354 {cells}/uL (ref 15–500)
EOS PCT: 6 %
HCT: 43.6 % (ref 38.5–50.0)
Hemoglobin: 14.7 g/dL (ref 13.0–17.0)
Lymphocytes Relative: 38 %
Lymphs Abs: 2242 cells/uL (ref 850–3900)
MCH: 32.2 pg (ref 27.0–33.0)
MCHC: 33.7 g/dL (ref 32.0–36.0)
MCV: 95.6 fL (ref 80.0–100.0)
MONOS PCT: 13 %
MPV: 10.3 fL (ref 7.5–12.5)
Monocytes Absolute: 767 cells/uL (ref 200–950)
NEUTROS ABS: 2478 {cells}/uL (ref 1500–7800)
Neutrophils Relative %: 42 %
PLATELETS: 169 10*3/uL (ref 140–400)
RBC: 4.56 MIL/uL (ref 4.20–5.80)
RDW: 13.7 % (ref 11.0–15.0)
WBC: 5.9 10*3/uL (ref 3.8–10.8)

## 2016-06-27 LAB — COMPLETE METABOLIC PANEL WITH GFR
ALT: 25 U/L (ref 9–46)
AST: 35 U/L (ref 10–35)
Albumin: 3.8 g/dL (ref 3.6–5.1)
Alkaline Phosphatase: 63 U/L (ref 40–115)
BILIRUBIN TOTAL: 0.7 mg/dL (ref 0.2–1.2)
BUN: 21 mg/dL (ref 7–25)
CHLORIDE: 104 mmol/L (ref 98–110)
CO2: 26 mmol/L (ref 20–31)
CREATININE: 1.43 mg/dL — AB (ref 0.70–1.18)
Calcium: 9.1 mg/dL (ref 8.6–10.3)
GFR, EST AFRICAN AMERICAN: 57 mL/min — AB (ref >=60)
GFR, Est Non African American: 49 mL/min — ABNORMAL LOW (ref >=60)
GLUCOSE: 85 mg/dL (ref 70–99)
Potassium: 4.3 mmol/L (ref 3.5–5.3)
SODIUM: 142 mmol/L (ref 135–146)
TOTAL PROTEIN: 6.2 g/dL (ref 6.1–8.1)

## 2016-06-27 LAB — LIPID PANEL
CHOL/HDL RATIO: 4.9 ratio (ref <5.0)
CHOLESTEROL: 204 mg/dL — AB (ref <200)
HDL: 42 mg/dL (ref >40)
LDL CALC: 122 mg/dL — AB (ref <100)
Triglycerides: 199 mg/dL — ABNORMAL HIGH (ref <150)
VLDL: 40 mg/dL — AB (ref <30)

## 2016-06-27 LAB — PSA: PSA: 0.9 ng/mL (ref <=4.0)

## 2016-06-27 LAB — TSH: TSH: 3.62 mIU/L (ref 0.40–4.50)

## 2016-07-05 ENCOUNTER — Encounter (HOSPITAL_COMMUNITY): Payer: Self-pay | Admitting: Emergency Medicine

## 2016-07-05 ENCOUNTER — Ambulatory Visit (HOSPITAL_COMMUNITY)
Admission: EM | Admit: 2016-07-05 | Discharge: 2016-07-05 | Disposition: A | Discharge status: Home / Self Care | Payer: Medicare Other | Attending: Internal Medicine | Admitting: Internal Medicine

## 2016-07-05 DIAGNOSIS — J0110 Acute frontal sinusitis, unspecified: Secondary | ICD-10-CM | POA: Diagnosis not present

## 2016-07-05 MED ORDER — AMOXICILLIN-POT CLAVULANATE 875-125 MG PO TABS: 1.0000 | ORAL_TABLET | Freq: Two times a day (BID) | ORAL | 0 refills | Status: AC

## 2016-07-05 NOTE — Discharge Instructions (Signed)
Recommend start Augmentin 875mg  twice a day as directed. May continue Flonase use as directed. Increase fluid intake to help loosen mucus. May take Ibuprofen 600mg  every 8 hours as needed for body aches and headaches. Follow-up with your primary care provider in 3 to 4 days if not improving.

## 2016-07-05 NOTE — ED Provider Notes (Signed)
CSN: [id number]     Arrival date & time 07/05/16  1001 History   First MD Initiated Contact with Patient 07/05/16 1146     Chief Complaint  Patient presents with  . URI   (Consider location/radiation/quality/duration/timing/severity/associated sxs/prior Treatment) 71 year old male presents with nasal congestion, sinus pressure, headache, slight cough and chills for the past 5 to 6 days. Had a fever of 101 last night and some nausea. Denies any vomiting or diarrhea. Has taken Flonase, Delsym, Alka Seltzer with some relief. History of HTN and gout- controlled with medication.    The history is provided by the patient.    Past Medical History:  Diagnosis Date  . Anxiety   . Gout   . Hyperlipidemia   . Hypertension   . Hypogonadism male   . Hypogonadism male    Past Surgical History:  Procedure Laterality Date  . APPENDECTOMY    . ROTATOR CUFF REPAIR     History reviewed. No pertinent family history. Social History  Substance Use Topics  . Smoking status: Never Smoker  . Smokeless tobacco: Never Used  . Alcohol use No    Review of Systems  Constitutional: Positive for chills, fatigue and fever. Negative for appetite change.  HENT: Positive for congestion, rhinorrhea, sinus pain and sinus pressure. Negative for ear pain and sore throat.   Eyes: Negative for discharge.  Respiratory: Positive for cough. Negative for chest tightness, shortness of breath and wheezing.   Cardiovascular: Negative for chest pain.  Gastrointestinal: Positive for nausea. Negative for diarrhea and vomiting.  Musculoskeletal: Negative for arthralgias, back pain, myalgias, neck pain and neck stiffness.  Skin: Negative for rash.  Neurological: Positive for headaches. Negative for dizziness, syncope, weakness and light-headedness.  Hematological: Negative for adenopathy.    Allergies  Patient has no known allergies.  Home Medications   Prior to Admission medications   Medication Sig Start Date End  Date Taking? Authorizing Provider  allopurinol (ZYLOPRIM) 100 MG tablet TAKE 2 TABLETS (200 MG TOTAL) BY MOUTH DAILY. 12/27/15  Yes Susy Frizzle, MD  aspirin 81 MG tablet Take 81 mg by mouth daily.   Yes Historical Provider, MD  Cholecalciferol (VITAMIN D3) 2000 UNITS TABS Take by mouth.   Yes Historical Provider, MD  L-Arginine 500 MG TABS Take by mouth.   Yes Historical Provider, MD  losartan (COZAAR) 100 MG tablet TAKE 1 TABLET EVERY DAY 10/20/15  Yes Susy Frizzle, MD  Magnesium 500 MG CAPS Take by mouth.   Yes Historical Provider, MD  Multiple Vitamins-Minerals (CENTRUM SILVER ADULT 50+) TABS Take by mouth.   Yes Historical Provider, MD  traMADol (ULTRAM) 50 MG tablet Take 50 mg by mouth every 12 (twelve) hours as needed for moderate pain.   Yes Historical Provider, MD  vitamin B-12 (CYANOCOBALAMIN) 1000 MCG tablet Take 3,000 mcg by mouth daily.   Yes Historical Provider, MD  Zinc 50 MG TABS Take by mouth.   Yes Historical Provider, MD  amoxicillin-clavulanate (AUGMENTIN) 875-125 MG tablet Take 1 tablet by mouth every 12 (twelve) hours. 07/05/16 07/12/16  Katy Apo, NP  sildenafil (VIAGRA) 100 MG tablet Take 100 mg by mouth daily as needed.      Historical Provider, MD   Meds Ordered and Administered this Visit  Medications - No data to display  BP 150/93 (BP Location: Left Arm)   Pulse 92   Temp 98.3 F (36.8 C) (Oral)   Resp 18   SpO2 95%  No data found.  Physical Exam  Constitutional: He is oriented to person, place, and time. He appears well-developed and well-nourished. No distress.  HENT:  Head: Normocephalic and atraumatic.  Right Ear: Hearing, tympanic membrane, external ear and ear canal normal.  Left Ear: Hearing, tympanic membrane, external ear and ear canal normal.  Nose: Mucosal edema and rhinorrhea present. Right sinus exhibits frontal sinus tenderness. Right sinus exhibits no maxillary sinus tenderness. Left sinus exhibits frontal sinus tenderness. Left  sinus exhibits no maxillary sinus tenderness.  Mouth/Throat: Uvula is midline and mucous membranes are normal. Posterior oropharyngeal erythema present.  Neck: Normal range of motion. Neck supple.  Cardiovascular: Normal rate, regular rhythm and normal heart sounds.   Pulmonary/Chest: Effort normal and breath sounds normal. No respiratory distress. He has no decreased breath sounds. He has no wheezes. He has no rhonchi.  Lymphadenopathy:    He has no cervical adenopathy.  Neurological: He is alert and oriented to person, place, and time.  Skin: Skin is warm. Capillary refill takes less than 2 seconds.  Psychiatric: He has a normal mood and affect. His behavior is normal. Judgment and thought content normal.    Urgent Care Course     Procedures (including critical care time)  Labs Review Labs Reviewed - No data to display  Imaging Review No results found.   Visual Acuity Review  Right Eye Distance:   Left Eye Distance:   Bilateral Distance:    Right Eye Near:   Left Eye Near:    Bilateral Near:         MDM   1. Acute non-recurrent frontal sinusitis    Discussed with patient that he appears to have a sinus infection. Recommend start Augmentin 875mg  twice a day as directed. May continue Flonase as directed. Increase fluid intake to help loosen mucus. May take Ibuprofen 600mg  every 8 hours as needed for body aches and headaches. Follow-up with his primary care provider in 3 to 4 days if not improving.      Katy Apo, NP 07/05/16 2213

## 2016-07-05 NOTE — ED Triage Notes (Signed)
Pt c/o cold sx onset: 5 days  Sx include: nasal congestion/drainage, cough, BA, HA, fevers  Reports fever last night was 101.0  Taking: OTC cold meds w/temp relief.   A&O x4... NAD

## 2016-07-19 ENCOUNTER — Ambulatory Visit (INDEPENDENT_AMBULATORY_CARE_PROVIDER_SITE_OTHER): Payer: Medicare Other | Admitting: Family Medicine

## 2016-07-19 VITALS — BP 128/70 | HR 66 | Temp 97.9°F | Resp 14 | Ht 70.5 in | Wt 242.0 lb

## 2016-07-19 DIAGNOSIS — Z0000 Encounter for general adult medical examination without abnormal findings: Secondary | ICD-10-CM | POA: Diagnosis not present

## 2016-07-19 NOTE — Progress Notes (Signed)
Subjective:    Patient ID: Chad Serrano, male    DOB: 1945/09/19, 71 y.o.   MRN: [id number]  HPI Here for CPE.  Last colonoscopy is 01/2008.  Immunizations are up-to-date.  Immunization History  Administered Date(s) Administered  . Hepatitis A 11/29/2003, 08/08/2009  . Hepatitis B 08/08/2009, 09/13/2009  . IPV 05/29/1965  . Influenza Split 04/28/2012, 04/13/2013  . Influenza Whole 02/16/2011  . Influenza,inj,Quad PF,36+ Mos 04/30/2014, 05/05/2015, 03/13/2016  . Plague 03/28/1966  . Pneumococcal Conjugate-13 05/11/2013  . Pneumococcal Polysaccharide-23 04/30/2014  . Td 04/28/2012  . Typhoid Parenteral 01/27/1965, 08/08/2009   He is due for the shingles vaccine but he would like to check on the price first. He denies any concerns. His most recent lab work as listed below. Lab on 06/27/2016  Component Date Value Ref Range Status  . PSA 06/27/2016 0.9  <=4.0 ng/mL Final   Comment:   The total PSA value from this assay system is standardized against the WHO standard. The test result will be approximately 20% lower when compared to the equimolar-standardized total PSA (Beckman Coulter). Comparison of serial PSA results should be interpreted with this fact in mind.   This test was performed using the Siemens chemiluminescent method. Values obtained from different assay methods cannot be used interchangeably. PSA levels, regardless of value, should not be interpreted as absolute evidence of the presence or absence of disease.     . Sodium 06/27/2016 142  135 - 146 mmol/L Final  . Potassium 06/27/2016 4.3  3.5 - 5.3 mmol/L Final  . Chloride 06/27/2016 104  98 - 110 mmol/L Final  . CO2 06/27/2016 26  20 - 31 mmol/L Final  . Glucose, Bld 06/27/2016 85  70 - 99 mg/dL Final  . BUN 06/27/2016 21  7 - 25 mg/dL Final  . Creat 06/27/2016 1.43* 0.70 - 1.18 mg/dL Final   Comment:   For patients > or = 71 years of age: The upper reference limit for Creatinine is approximately 13% higher  for people identified as African-American.     . Total Bilirubin 06/27/2016 0.7  0.2 - 1.2 mg/dL Final  . Alkaline Phosphatase 06/27/2016 63  40 - 115 U/L Final  . AST 06/27/2016 35  10 - 35 U/L Final  . ALT 06/27/2016 25  9 - 46 U/L Final  . Total Protein 06/27/2016 6.2  6.1 - 8.1 g/dL Final  . Albumin 06/27/2016 3.8  3.6 - 5.1 g/dL Final  . Calcium 06/27/2016 9.1  8.6 - 10.3 mg/dL Final  . GFR, Est African American 06/27/2016 57* >=60 mL/min Final  . GFR, Est Non African American 06/27/2016 49* >=60 mL/min Final  . TSH 06/27/2016 3.62  0.40 - 4.50 mIU/L Final  . Cholesterol 06/27/2016 204* <200 mg/dL Final  . Triglycerides 06/27/2016 199* <150 mg/dL Final  . HDL 06/27/2016 42  >40 mg/dL Final  . Total CHOL/HDL Ratio 06/27/2016 4.9  <5.0 Ratio Final  . VLDL 06/27/2016 40* <30 mg/dL Final  . LDL Cholesterol 06/27/2016 122* <100 mg/dL Final  . WBC 06/27/2016 5.9  3.8 - 10.8 K/uL Final  . RBC 06/27/2016 4.56  4.20 - 5.80 MIL/uL Final  . Hemoglobin 06/27/2016 14.7  13.0 - 17.0 g/dL Final  . HCT 06/27/2016 43.6  38.5 - 50.0 % Final  . MCV 06/27/2016 95.6  80.0 - 100.0 fL Final  . MCH 06/27/2016 32.2  27.0 - 33.0 pg Final  . MCHC 06/27/2016 33.7  32.0 - 36.0 g/dL Final  .  RDW 06/27/2016 13.7  11.0 - 15.0 % Final  . Platelets 06/27/2016 169  140 - 400 K/uL Final  . MPV 06/27/2016 10.3  7.5 - 12.5 fL Final  . Neutro Abs 06/27/2016 2478  1,500 - 7,800 cells/uL Final  . Lymphs Abs 06/27/2016 2242  850 - 3,900 cells/uL Final  . Monocytes Absolute 06/27/2016 767  200 - 950 cells/uL Final  . Eosinophils Absolute 06/27/2016 354  15 - 500 cells/uL Final  . Basophils Absolute 06/27/2016 59  0 - 200 cells/uL Final  . Neutrophils Relative % 06/27/2016 42  % Final  . Lymphocytes Relative 06/27/2016 38  % Final  . Monocytes Relative 06/27/2016 13  % Final  . Eosinophils Relative 06/27/2016 6  % Final  . Basophils Relative 06/27/2016 1  % Final  . Smear Review 06/27/2016 Criteria for review not  met   Final    Immunization History  Administered Date(s) Administered  . Hepatitis A 11/29/2003, 08/08/2009  . Hepatitis B 08/08/2009, 09/13/2009  . IPV 05/29/1965  . Influenza Split 04/28/2012, 04/13/2013  . Influenza Whole 02/16/2011  . Influenza,inj,Quad PF,36+ Mos 04/30/2014, 05/05/2015, 03/13/2016  . Plague 03/28/1966  . Pneumococcal Conjugate-13 05/11/2013  . Pneumococcal Polysaccharide-23 04/30/2014  . Td 04/28/2012  . Typhoid Parenteral 01/27/1965, 08/08/2009   Past Medical History:  Diagnosis Date  . Anxiety   . Gout   . Hyperlipidemia   . Hypertension   . Hypogonadism male   . Hypogonadism male    Past Surgical History:  Procedure Laterality Date  . APPENDECTOMY    . ROTATOR CUFF REPAIR     Current Outpatient Prescriptions on File Prior to Visit  Medication Sig Dispense Refill  . allopurinol (ZYLOPRIM) 100 MG tablet TAKE 2 TABLETS (200 MG TOTAL) BY MOUTH DAILY. 180 tablet 1  . aspirin 81 MG tablet Take 81 mg by mouth daily.    . Cholecalciferol (VITAMIN D3) 2000 UNITS TABS Take by mouth.    Marland Kitchen L-Arginine 500 MG TABS Take by mouth.    . losartan (COZAAR) 100 MG tablet TAKE 1 TABLET EVERY DAY 90 tablet 3  . Magnesium 500 MG CAPS Take by mouth.    . Multiple Vitamins-Minerals (CENTRUM SILVER ADULT 50+) TABS Take by mouth.    . sildenafil (VIAGRA) 100 MG tablet Take 100 mg by mouth daily as needed.      . traMADol (ULTRAM) 50 MG tablet Take 50 mg by mouth every 12 (twelve) hours as needed for moderate pain.    . vitamin B-12 (CYANOCOBALAMIN) 1000 MCG tablet Take 3,000 mcg by mouth daily.    . Zinc 50 MG TABS Take by mouth.     No current facility-administered medications on file prior to visit.    No Known Allergies Social History   Social History  . Marital status: Married    Spouse name: N/A  . Number of children: N/A  . Years of education: N/A   Occupational History  . Not on file.   Social History Main Topics  . Smoking status: Never Smoker  .  Smokeless tobacco: Never Used  . Alcohol use No  . Drug use: No  . 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 Not on file   Social History Narrative  . No narrative on file   No family history on file.    Review of Systems  All other systems reviewed and are negative.  Objective:   Physical Exam  Constitutional: He is oriented to person, place, and time. He appears well-developed and well-nourished. No distress.  HENT:  Head: Normocephalic and atraumatic.  Right Ear: External ear normal.  Left Ear: External ear normal.  Nose: Nose normal.  Mouth/Throat: Oropharynx is clear and moist. No oropharyngeal exudate.  Eyes: Conjunctivae and EOM are normal. Pupils are equal, round, and reactive to light. Right eye exhibits no discharge. Left eye exhibits no discharge. No scleral icterus.  Neck: Normal range of motion. Neck supple. No JVD present. No tracheal deviation present. No thyromegaly present.  Cardiovascular: Normal rate, regular rhythm, normal heart sounds and intact distal pulses.  Exam reveals no gallop and no friction rub.   No murmur heard. Pulmonary/Chest: Effort normal and breath sounds normal. No stridor. No respiratory distress. He has no wheezes. He has no rales. He exhibits no tenderness.  Abdominal: Soft. Bowel sounds are normal. He exhibits no distension and no mass. There is no tenderness. There is no rebound and no guarding.  Genitourinary: Rectum normal and prostate normal.  Musculoskeletal: Normal range of motion. He exhibits no edema or tenderness.  Lymphadenopathy:    He has no cervical adenopathy.  Neurological: He is alert and oriented to person, place, and time. He has normal reflexes. No cranial nerve deficit. He exhibits normal muscle tone. Coordination normal.  Skin: Skin is warm. No rash noted. He is not diaphoretic. No erythema. No pallor.  Psychiatric: He has a normal mood and  affect. His behavior is normal. Judgment and thought content normal.  Vitals reviewed.         Assessment & Plan:  Routine general medical examination at a health care facility   patient's physical exam today is normal.  Lab work is significant for chronic kidney disease however I believe this is overestimated in this patient based on his creatinine. He has an elevated creatinine but he also has an extremely high muscle mass. Therefore I do not believe his kidney disease is quite as severe as his glomerular filtration rate was suggest. Cholesterol is slightly elevated but not significant enough to warrant statin. The remainder of his preventative care is up-to-date. He will be due for colonoscopy next year. PSA today is normal.

## 2016-07-23 DIAGNOSIS — M48061 Spinal stenosis, lumbar region without neurogenic claudication: Secondary | ICD-10-CM | POA: Diagnosis not present

## 2016-10-18 ENCOUNTER — Other Ambulatory Visit: Payer: Self-pay | Admitting: Family Medicine

## 2016-10-22 DIAGNOSIS — M48061 Spinal stenosis, lumbar region without neurogenic claudication: Secondary | ICD-10-CM | POA: Diagnosis not present

## 2016-10-29 DIAGNOSIS — L400 Psoriasis vulgaris: Secondary | ICD-10-CM | POA: Diagnosis not present

## 2016-10-29 DIAGNOSIS — L821 Other seborrheic keratosis: Secondary | ICD-10-CM | POA: Diagnosis not present

## 2016-10-29 DIAGNOSIS — L720 Epidermal cyst: Secondary | ICD-10-CM | POA: Diagnosis not present

## 2016-10-29 DIAGNOSIS — L570 Actinic keratosis: Secondary | ICD-10-CM | POA: Diagnosis not present

## 2016-10-29 DIAGNOSIS — L812 Freckles: Secondary | ICD-10-CM | POA: Diagnosis not present

## 2016-11-18 ENCOUNTER — Other Ambulatory Visit: Payer: Self-pay | Admitting: Family Medicine

## 2016-11-20 DIAGNOSIS — I10 Essential (primary) hypertension: Secondary | ICD-10-CM | POA: Diagnosis not present

## 2016-11-20 DIAGNOSIS — Z6832 Body mass index (BMI) 32.0-32.9, adult: Secondary | ICD-10-CM | POA: Diagnosis not present

## 2016-11-20 DIAGNOSIS — M48061 Spinal stenosis, lumbar region without neurogenic claudication: Secondary | ICD-10-CM | POA: Diagnosis not present

## 2016-12-05 ENCOUNTER — Other Ambulatory Visit: Payer: Self-pay | Admitting: Family Medicine

## 2016-12-08 ENCOUNTER — Other Ambulatory Visit: Payer: Self-pay | Admitting: Family Medicine

## 2017-01-24 DIAGNOSIS — M48061 Spinal stenosis, lumbar region without neurogenic claudication: Secondary | ICD-10-CM | POA: Diagnosis not present

## 2017-02-25 ENCOUNTER — Ambulatory Visit (INDEPENDENT_AMBULATORY_CARE_PROVIDER_SITE_OTHER): Payer: Medicare Other | Admitting: Family Medicine

## 2017-02-25 DIAGNOSIS — Z23 Encounter for immunization: Secondary | ICD-10-CM

## 2017-04-25 DIAGNOSIS — M48061 Spinal stenosis, lumbar region without neurogenic claudication: Secondary | ICD-10-CM | POA: Diagnosis not present

## 2017-04-27 ENCOUNTER — Other Ambulatory Visit: Payer: Self-pay | Admitting: Family Medicine

## 2017-05-01 NOTE — Telephone Encounter (Signed)
Medication refilled per protocol. 

## 2017-05-23 DIAGNOSIS — I10 Essential (primary) hypertension: Secondary | ICD-10-CM | POA: Diagnosis not present

## 2017-05-23 DIAGNOSIS — M48061 Spinal stenosis, lumbar region without neurogenic claudication: Secondary | ICD-10-CM | POA: Diagnosis not present

## 2017-05-23 DIAGNOSIS — Z6833 Body mass index (BMI) 33.0-33.9, adult: Secondary | ICD-10-CM | POA: Diagnosis not present

## 2017-07-17 ENCOUNTER — Other Ambulatory Visit: Payer: Self-pay | Admitting: Family Medicine

## 2017-07-17 DIAGNOSIS — E782 Mixed hyperlipidemia: Secondary | ICD-10-CM

## 2017-07-17 DIAGNOSIS — Z79899 Other long term (current) drug therapy: Secondary | ICD-10-CM

## 2017-07-17 DIAGNOSIS — Z0000 Encounter for general adult medical examination without abnormal findings: Secondary | ICD-10-CM

## 2017-07-17 DIAGNOSIS — Z125 Encounter for screening for malignant neoplasm of prostate: Secondary | ICD-10-CM

## 2017-07-17 DIAGNOSIS — I10 Essential (primary) hypertension: Secondary | ICD-10-CM

## 2017-07-23 ENCOUNTER — Other Ambulatory Visit: Payer: Medicare Other

## 2017-07-23 DIAGNOSIS — I10 Essential (primary) hypertension: Secondary | ICD-10-CM | POA: Diagnosis not present

## 2017-07-23 DIAGNOSIS — Z79899 Other long term (current) drug therapy: Secondary | ICD-10-CM

## 2017-07-23 DIAGNOSIS — E782 Mixed hyperlipidemia: Secondary | ICD-10-CM | POA: Diagnosis not present

## 2017-07-23 DIAGNOSIS — Z125 Encounter for screening for malignant neoplasm of prostate: Secondary | ICD-10-CM

## 2017-07-23 DIAGNOSIS — Z0000 Encounter for general adult medical examination without abnormal findings: Secondary | ICD-10-CM

## 2017-07-24 LAB — COMPREHENSIVE METABOLIC PANEL
AG RATIO: 2 (calc) (ref 1.0–2.5)
ALBUMIN MSPROF: 3.9 g/dL (ref 3.6–5.1)
ALT: 26 U/L (ref 9–46)
AST: 32 U/L (ref 10–35)
Alkaline phosphatase (APISO): 67 U/L (ref 40–115)
BUN / CREAT RATIO: 11 (calc) (ref 6–22)
BUN: 18 mg/dL (ref 7–25)
CO2: 28 mmol/L (ref 20–32)
Calcium: 9.1 mg/dL (ref 8.6–10.3)
Chloride: 105 mmol/L (ref 98–110)
Creat: 1.63 mg/dL — ABNORMAL HIGH (ref 0.70–1.18)
GLUCOSE: 93 mg/dL (ref 65–99)
Globulin: 2 g/dL (calc) (ref 1.9–3.7)
POTASSIUM: 4.4 mmol/L (ref 3.5–5.3)
SODIUM: 140 mmol/L (ref 135–146)
TOTAL PROTEIN: 5.9 g/dL — AB (ref 6.1–8.1)
Total Bilirubin: 0.9 mg/dL (ref 0.2–1.2)

## 2017-07-24 LAB — CBC WITH DIFFERENTIAL/PLATELET
BASOS PCT: 1.1 %
Basophils Absolute: 68 cells/uL (ref 0–200)
EOS ABS: 248 {cells}/uL (ref 15–500)
Eosinophils Relative: 4 %
HCT: 42 % (ref 38.5–50.0)
Hemoglobin: 14.6 g/dL (ref 13.2–17.1)
Lymphs Abs: 2784 cells/uL (ref 850–3900)
MCH: 32.3 pg (ref 27.0–33.0)
MCHC: 34.8 g/dL (ref 32.0–36.0)
MCV: 92.9 fL (ref 80.0–100.0)
MPV: 10.9 fL (ref 7.5–12.5)
Monocytes Relative: 14.6 %
NEUTROS PCT: 35.4 %
Neutro Abs: 2195 cells/uL (ref 1500–7800)
PLATELETS: 168 10*3/uL (ref 140–400)
RBC: 4.52 10*6/uL (ref 4.20–5.80)
RDW: 12.6 % (ref 11.0–15.0)
TOTAL LYMPHOCYTE: 44.9 %
WBC: 6.2 10*3/uL (ref 3.8–10.8)
WBCMIX: 905 {cells}/uL (ref 200–950)

## 2017-07-24 LAB — LIPID PANEL
Cholesterol: 198 mg/dL (ref <200)
HDL: 45 mg/dL (ref >40)
LDL CHOLESTEROL (CALC): 125 mg/dL — AB
NON-HDL CHOLESTEROL (CALC): 153 mg/dL — AB (ref <130)
TRIGLYCERIDES: 161 mg/dL — AB (ref <150)
Total CHOL/HDL Ratio: 4.4 (calc) (ref <5.0)

## 2017-07-24 LAB — PSA: PSA: 0.7 ng/mL (ref <=4.0)

## 2017-07-25 DIAGNOSIS — M48061 Spinal stenosis, lumbar region without neurogenic claudication: Secondary | ICD-10-CM | POA: Diagnosis not present

## 2017-07-26 ENCOUNTER — Encounter: Payer: Self-pay | Admitting: Family Medicine

## 2017-07-26 ENCOUNTER — Ambulatory Visit (INDEPENDENT_AMBULATORY_CARE_PROVIDER_SITE_OTHER): Payer: Medicare Other | Admitting: Family Medicine

## 2017-07-26 VITALS — BP 132/80 | HR 102 | Temp 98.0°F | Resp 16 | Ht 70.5 in | Wt 243.0 lb

## 2017-07-26 DIAGNOSIS — Z0000 Encounter for general adult medical examination without abnormal findings: Secondary | ICD-10-CM

## 2017-07-26 DIAGNOSIS — N289 Disorder of kidney and ureter, unspecified: Secondary | ICD-10-CM

## 2017-07-26 DIAGNOSIS — N183 Chronic kidney disease, stage 3 unspecified: Secondary | ICD-10-CM

## 2017-07-26 DIAGNOSIS — Z1211 Encounter for screening for malignant neoplasm of colon: Secondary | ICD-10-CM | POA: Diagnosis not present

## 2017-07-26 NOTE — Progress Notes (Signed)
Subjective:    Patient ID: Chad Serrano, male    DOB: May 22, 1945, 72 y.o.   MRN: [id number]  HPI Patient is a 72 year old white gentleman here today for complete physical exam. Past medical history is significant for body building. There is a remote history of metabolic steroid use. Patient continues today to use muscle building supplements such as creatine and protein supplements. He is due for colonoscopy this year. Last colonoscopy is 01/2008.  Immunizations are up-to-date.  Immunization History  Administered Date(s) Administered  . Hepatitis A 11/29/2003, 08/08/2009  . Hepatitis B 08/08/2009, 09/13/2009  . IPV 05/29/1965  . Influenza Split 04/28/2012, 04/13/2013  . Influenza Whole 02/16/2011  . Influenza, High Dose Seasonal PF 02/25/2017  . Influenza,inj,Quad PF,6+ Mos 04/30/2014, 05/05/2015, 03/13/2016  . Plague 03/28/1966  . Pneumococcal Conjugate-13 05/11/2013  . Pneumococcal Polysaccharide-23 04/30/2014  . Td 04/28/2012  . Typhoid Parenteral 01/27/1965, 08/08/2009   He is due for the shingles vaccine but he would like to check on the price first. He denies any concerns. His most recent lab work as listed below. Appointment on 07/23/2017  Component Date Value Ref Range Status  . WBC 07/23/2017 6.2  3.8 - 10.8 Thousand/uL Final  . RBC 07/23/2017 4.52  4.20 - 5.80 Million/uL Final  . Hemoglobin 07/23/2017 14.6  13.2 - 17.1 g/dL Final  . HCT 07/23/2017 42.0  38.5 - 50.0 % Final  . MCV 07/23/2017 92.9  80.0 - 100.0 fL Final  . MCH 07/23/2017 32.3  27.0 - 33.0 pg Final  . MCHC 07/23/2017 34.8  32.0 - 36.0 g/dL Final  . RDW 07/23/2017 12.6  11.0 - 15.0 % Final  . Platelets 07/23/2017 168  140 - 400 Thousand/uL Final  . MPV 07/23/2017 10.9  7.5 - 12.5 fL Final  . Neutro Abs 07/23/2017 2,195  1,500 - 7,800 cells/uL Final  . Lymphs Abs 07/23/2017 2,784  850 - 3,900 cells/uL Final  . WBC mixed population 07/23/2017 905  200 - 950 cells/uL Final  . Eosinophils Absolute 07/23/2017  248  15 - 500 cells/uL Final  . Basophils Absolute 07/23/2017 68  0 - 200 cells/uL Final  . Neutrophils Relative % 07/23/2017 35.4  % Final  . Total Lymphocyte 07/23/2017 44.9  % Final  . Monocytes Relative 07/23/2017 14.6  % Final  . Eosinophils Relative 07/23/2017 4.0  % Final  . Basophils Relative 07/23/2017 1.1  % Final  . Glucose, Bld 07/23/2017 93  65 - 99 mg/dL Final   Comment: .            Fasting reference interval .   . BUN 07/23/2017 18  7 - 25 mg/dL Final  . Creat 07/23/2017 1.63* 0.70 - 1.18 mg/dL Final   Comment: For patients >17 years of age, the reference limit for Creatinine is approximately 13% higher for people identified as African-American. .   Havery Moros Ratio 07/23/2017 11  6 - 22 (calc) Final  . Sodium 07/23/2017 140  135 - 146 mmol/L Final  . Potassium 07/23/2017 4.4  3.5 - 5.3 mmol/L Final  . Chloride 07/23/2017 105  98 - 110 mmol/L Final  . CO2 07/23/2017 28  20 - 32 mmol/L Final  . Calcium 07/23/2017 9.1  8.6 - 10.3 mg/dL Final  . Total Protein 07/23/2017 5.9* 6.1 - 8.1 g/dL Final  . Albumin 07/23/2017 3.9  3.6 - 5.1 g/dL Final  . Globulin 07/23/2017 2.0  1.9 - 3.7 g/dL (calc) Final  . AG Ratio 07/23/2017 2.0  1.0 - 2.5 (calc) Final  . Total Bilirubin 07/23/2017 0.9  0.2 - 1.2 mg/dL Final  . Alkaline phosphatase (APISO) 07/23/2017 67  40 - 115 U/L Final  . AST 07/23/2017 32  10 - 35 U/L Final  . ALT 07/23/2017 26  9 - 46 U/L Final  . Cholesterol 07/23/2017 198  <200 mg/dL Final  . HDL 07/23/2017 45  >40 mg/dL Final  . Triglycerides 07/23/2017 161* <150 mg/dL Final  . LDL Cholesterol (Calc) 07/23/2017 125* mg/dL (calc) Final   Comment: Reference range: <100 . Desirable range <100 mg/dL for primary prevention;   <70 mg/dL for patients with CHD or diabetic patients  with > or = 2 CHD risk factors. Marland Kitchen LDL-C is now calculated using the Martin-Hopkins  calculation, which is a validated novel method providing  better accuracy than the Friedewald  equation in the  estimation of LDL-C.  Cresenciano Genre et al. Annamaria Helling. 5366;440(34): 2061-2068  (http://education.[facility].com/faq/FAQ164)   . Total CHOL/HDL Ratio 07/23/2017 4.4  <5.0 (calc) Final  . Non-HDL Cholesterol (Calc) 07/23/2017 153* <130 mg/dL (calc) Final   Comment: For patients with diabetes plus 1 major ASCVD risk  factor, treating to a non-HDL-C goal of <100 mg/dL  (LDL-C of <70 mg/dL) is considered a therapeutic  option.   Marland Kitchen PSA 07/23/2017 0.7  < OR = 4.0 ng/mL Final   Comment: The total PSA value from this assay system is  standardized against the WHO standard. The test  result will be approximately 20% lower when compared  to the equimolar-standardized total PSA (Beckman  Coulter). Comparison of serial PSA results should be  interpreted with this fact in mind. . This test was performed using the Siemens  chemiluminescent method. Values obtained from  different assay methods cannot be used interchangeably. PSA levels, regardless of value, should not be interpreted as absolute evidence of the presence or absence of disease.     Immunization History  Administered Date(s) Administered  . Hepatitis A 11/29/2003, 08/08/2009  . Hepatitis B 08/08/2009, 09/13/2009  . IPV 05/29/1965  . Influenza Split 04/28/2012, 04/13/2013  . Influenza Whole 02/16/2011  . Influenza, High Dose Seasonal PF 02/25/2017  . Influenza,inj,Quad PF,6+ Mos 04/30/2014, 05/05/2015, 03/13/2016  . Plague 03/28/1966  . Pneumococcal Conjugate-13 05/11/2013  . Pneumococcal Polysaccharide-23 04/30/2014  . Td 04/28/2012  . Typhoid Parenteral 01/27/1965, 08/08/2009   Past Medical History:  Diagnosis Date  . Anxiety   . Gout   . Hyperlipidemia   . Hypertension   . Hypogonadism male   . Hypogonadism male    Past Surgical History:  Procedure Laterality Date  . APPENDECTOMY    . ROTATOR CUFF REPAIR     Current Outpatient Medications on File Prior to Visit  Medication Sig Dispense Refill  .  allopurinol (ZYLOPRIM) 100 MG tablet TAKE 2 TABLETS BY MOUTH EVERY DAY 180 tablet 0  . aspirin 81 MG tablet Take 81 mg by mouth daily.    . Cholecalciferol (VITAMIN D3) 2000 UNITS TABS Take by mouth.    Marland Kitchen L-Arginine 500 MG TABS Take by mouth.    . losartan (COZAAR) 100 MG tablet TAKE 1 TABLET BY MOUTH DAILY 90 tablet 2  . Magnesium 500 MG CAPS Take by mouth.    . Multiple Vitamins-Minerals (CENTRUM SILVER ADULT 50+) TABS Take by mouth.    . sildenafil (VIAGRA) 100 MG tablet Take 100 mg by mouth daily as needed.      . traMADol (ULTRAM) 50 MG tablet Take 50 mg by mouth  every 12 (twelve) hours as needed for moderate pain.    . vitamin B-12 (CYANOCOBALAMIN) 1000 MCG tablet Take 3,000 mcg by mouth daily.    . Zinc 50 MG TABS Take by mouth.     No current facility-administered medications on file prior to visit.    No Known Allergies Social History   Socioeconomic History  . Marital status: Married    Spouse name: Not on file  . Number of children: Not on file  . Years of education: Not on file  . Highest education level: Not on file  Social Needs  . Financial resource strain: Not on file  . Food insecurity - worry: Not on file  . Food insecurity - inability: Not on file  . Transportation needs - medical: Not on file  . Transportation needs - non-medical: Not on file  Occupational History  . Not on file  Tobacco Use  . Smoking status: Never Smoker  . Smokeless tobacco: Never Used  Substance and Sexual Activity  . Alcohol use: No  . Drug use: No  . Sexual activity: Not on file    Comment: married to [place].  Retired.  Weightlifter  Other Topics Concern  . Not on file  Social History Narrative  . Not on file   No family history on file.    Review of Systems  All other systems reviewed and are negative.      Objective:   Physical Exam  Constitutional: He is oriented to person, place, and time. He appears well-developed and well-nourished. No distress.  HENT:  Head:  Normocephalic and atraumatic.  Right Ear: External ear normal.  Left Ear: External ear normal.  Nose: Nose normal.  Mouth/Throat: Oropharynx is clear and moist. No oropharyngeal exudate.  Eyes: Conjunctivae and EOM are normal. Pupils are equal, round, and reactive to light. Right eye exhibits no discharge. Left eye exhibits no discharge. No scleral icterus.  Neck: Normal range of motion. Neck supple. No JVD present. No tracheal deviation present. No thyromegaly present.  Cardiovascular: Normal rate, regular rhythm, normal heart sounds and intact distal pulses. Exam reveals no gallop and no friction rub.  No murmur heard. Pulmonary/Chest: Effort normal and breath sounds normal. No stridor. No respiratory distress. He has no wheezes. He has no rales. He exhibits no tenderness.  Abdominal: Soft. Bowel sounds are normal. He exhibits no distension and no mass. There is no tenderness. There is no rebound and no guarding.  Genitourinary: Rectum normal and prostate normal.  Musculoskeletal: Normal range of motion. He exhibits no edema or tenderness.  Lymphadenopathy:    He has no cervical adenopathy.  Neurological: He is alert and oriented to person, place, and time. He has normal reflexes. No cranial nerve deficit. He exhibits normal muscle tone. Coordination normal.  Skin: Skin is warm. No rash noted. He is not diaphoretic. No erythema. No pallor.  Psychiatric: He has a normal mood and affect. His behavior is normal. Judgment and thought content normal.  Vitals reviewed.         Assessment & Plan:  Routine general medical examination at a health care facility  Acute renal insufficiency - Plan: Amb ref to Medical Nutrition Therapy-MNT, US Renal  CKD (chronic kidney disease) stage 3, GFR 30-59 ml/min (HCC) - Plan: Amb ref to Medical Nutrition Therapy-MNT, US Renal  Patient's physical exam is normal. I did recommend the shingles vaccine but he will check on the price first. Remaining vaccines  are up-to-date. Lab work is significant for  acute on chronic renal insufficiency. I do believe that the renal insufficiency is overestimated by his creatinine given his elevated muscle mass however there has been a deterioration in his renal function. Therefore I'll schedule the patient for renal ultrasound. He is not using NSAIDs and he drinks plenty of water. My concern is about his muscle building supplements along with his protein supplements. I recommended discontinuation of that in case this could be negatively impacting his renal function. Patient would like to meet with a nutritionist to discuss other dietary restrictions for chronic kidney disease as he is hesitant to make any changes in his vitamins and supplements due to his level bodybuilding. The remainder of his lab work is normal

## 2017-07-29 ENCOUNTER — Other Ambulatory Visit: Payer: Self-pay | Admitting: Family Medicine

## 2017-08-01 ENCOUNTER — Ambulatory Visit
Admission: RE | Admit: 2017-08-01 | Discharge: 2017-08-01 | Disposition: A | Discharge status: Home / Self Care | Payer: Medicare Other | Source: Ambulatory Visit | Attending: Family Medicine | Admitting: Family Medicine

## 2017-08-01 DIAGNOSIS — N179 Acute kidney failure, unspecified: Secondary | ICD-10-CM | POA: Diagnosis not present

## 2017-08-01 DIAGNOSIS — N183 Chronic kidney disease, stage 3 unspecified: Secondary | ICD-10-CM

## 2017-08-01 DIAGNOSIS — N289 Disorder of kidney and ureter, unspecified: Secondary | ICD-10-CM

## 2017-08-02 ENCOUNTER — Encounter: Payer: Self-pay | Admitting: Family Medicine

## 2017-08-15 ENCOUNTER — Encounter: Payer: Medicare Other | Attending: Family Medicine | Admitting: Dietician

## 2017-08-15 ENCOUNTER — Encounter: Payer: Self-pay | Admitting: Dietician

## 2017-08-15 DIAGNOSIS — N183 Chronic kidney disease, stage 3 unspecified: Secondary | ICD-10-CM

## 2017-08-15 DIAGNOSIS — Z713 Dietary counseling and surveillance: Secondary | ICD-10-CM | POA: Diagnosis not present

## 2017-08-15 DIAGNOSIS — N289 Disorder of kidney and ureter, unspecified: Secondary | ICD-10-CM | POA: Insufficient documentation

## 2017-08-15 NOTE — Patient Instructions (Signed)
Decrease your zinc supplementation (upper limit is 40 grams per day and you get this from diet, zinc supplement, multi vitamin, and possibly protein shake).  Avoid supplements that have additional phos... Containing products such as your protein shake.  Recommend avoiding Creatine. Avoid your current protein shake. If you drink a protein drink post work out, consider a plant based protein such as Orgain and mix with banana, frozen fruit, spinach (opt).  20 grams in the protein shake only). Avoid processed meats (hot dogs, ham, bacon, sausage), canned soup (unless lower in sodium).  Find ways to incorporate more fruit and vegetables in your diet. Meat portion the size of the palm of your hand Skinless chicken, [place], fish most often and red meat least often. Consider options for breakfast (such as oatmeal with nuts and fruits that are more energy sustaining that dry cereal).  Stop eating when satisfied.

## 2017-08-15 NOTE — Progress Notes (Signed)
Medical Nutrition Therapy:  Appt start time: 0900 end time:  1000.   Assessment:  Primary concerns today: Patient is here today alone due to concerns about increasing Creatinine.  He meets criteria for stage 3 CKD.  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 He has been body building since he was 49.  He took metabolic steroids for about 6 months when he was 20.   Other history includes Gout, Hyperlipidemia, and HTN.  He states that his blood pressure is under good control. UBW 230 lbs and current weight 245 lbs (generally up in the winter). Labs include:  07/23/17:  BUN 19, Creatinine:  1.63.  This is up from a creatine of 1.43 06/27/16. Lipids:  07/23/17:  Cholesterol 198, HDL: 45, Triglycerides 161, LDL 125.    Medications/supplements include:  Omega-3, vitamin D3, L-Arginine, Magnesium, Zinc- 50 mg daily, Ginseng, MVI, vitamin B-12, Cellular C-4 (Creatinine, nitric oxide, caffeine) for the past 6 years and AMP Wheybolic Alpha with 40 grams protein and phos containing ingredients for the past 5 years.  Patient is consuming about 130 grams of protein per day with his diet and another 40 grams per day when he exercises.  His diet is low in fruits and vegetables and higher in saturated fat.  He does not use added salt but does use processed foods.  Patient lives with his wife.  He is generally doing more of the cooking currently as his wife is sick (weight loss of 60 lbs without trying, nausea with and without food all undiagnosed reasons) and she is to see her Oncologist.  He is retired from [employer]bor[employer].  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   Preferred Learning Style:   No preference indicated   Learning Readiness:   Ready    DIETARY INTAKE:  Usual eating pattern includes 3 meals and 1-2 snacks per day.  24-hr recall:  B ( AM): dry  cereal, whole milk fruit, decaf coffee with whole milk, apple juice Snk (12 AM):protein shake (5 days per week) L (12:30 PM): sandwich (chicken salad, or ham pimento cheese, banana and PB), OR whole can campbell's soup, crackers Snk ( PM): 1-2 cookie or banana D ( PM): 10 ounces grilled steak, baked potato, salad OR pasta, with meat sauce, salad Snk ( PM): none Beverages: <1 cup whole milk per day, water, apple juice, decaf sweet tea, Cellucor c-4 (creatine, nitric oxide, caffeine) 5 days per week for the past 6 years, AMP Wheybolic Alpha (40 grams protein) 5 days per week for the past 6 years but others on and off throughout life  Usual physical activity: 5 days per week for 2 hours, weight lifting, stretching, cardio  Estimated energy needs: 2500 calories 100 g protein  Progress Towards Goal(s):  In progress.   Nutritional Diagnosis:  NB-1.1 Food and nutrition-related knowledge deficit As related to kidney function, sports nutrition, and diet.  As evidenced by diet hx and patient report.    Intervention:  Nutrition counseling/education related to recommendations related to his decreased kidney function.. Discussed nutrition related to kidney function.  Recommended avoiding the creatine supplement as well as avoiding or changing his protein supplement post work out and to a lower protein dose.  Discussed ways to decrease sodium, benefits of plant protein vs animal protein on kidney function, tips to improve nutrition quality of  his diet and decrease saturated fat as well as basic mindful eating.  Also discussed free phosphorous and to avoid this when possible (vs. Phosphorous found in foods which is currently fine).  If renal function does not change with these adjustments recommend patient come in for further recommendations. Also, recommended decreasing the zinc dose as he is exceeding the maximum recommendations from multiple sources.  PLAN: Decrease your zinc supplementation (upper limit  is 40 grams per day and you get this from diet, zinc supplement, multi vitamin, and possibly protein shake).  Avoid supplements that have additional phos... Containing products such as your protein shake.  Recommend avoiding Creatine. Avoid your current protein shake. If you drink a protein drink post work out, consider a plant based protein such as Orgain and mix with banana, frozen fruit, spinach (opt).  20 grams in the protein shake only). Avoid processed meats (hot dogs, ham, bacon, sausage), canned soup (unless lower in sodium).  Find ways to incorporate more fruit and vegetables in your diet. Meat portion the size of the palm of your hand Skinless chicken, [place], fish most often and red meat least often. Consider options for breakfast (such as oatmeal with nuts and fruits that are more energy sustaining that dry cereal).  Stop eating when satisfied.  Teaching Method Utilized:  Auditory  Handouts given during visit include:  Renal Pyramid (second page only)  Breakfast options  Barriers to learning/adherence to lifestyle change: none  Demonstrated degree of understanding via:  Teach Back   Monitoring/Evaluation:  Dietary intake, exercise, and body weight prn.

## 2017-10-20 ENCOUNTER — Other Ambulatory Visit: Payer: Self-pay | Admitting: Family Medicine

## 2017-10-24 DIAGNOSIS — M48061 Spinal stenosis, lumbar region without neurogenic claudication: Secondary | ICD-10-CM | POA: Diagnosis not present

## 2017-11-01 DIAGNOSIS — L400 Psoriasis vulgaris: Secondary | ICD-10-CM | POA: Diagnosis not present

## 2017-11-01 DIAGNOSIS — D692 Other nonthrombocytopenic purpura: Secondary | ICD-10-CM | POA: Diagnosis not present

## 2017-11-01 DIAGNOSIS — L570 Actinic keratosis: Secondary | ICD-10-CM | POA: Diagnosis not present

## 2017-11-01 DIAGNOSIS — M7130 Other bursal cyst, unspecified site: Secondary | ICD-10-CM | POA: Diagnosis not present

## 2017-11-01 DIAGNOSIS — D3617 Benign neoplasm of peripheral nerves and autonomic nervous system of trunk, unspecified: Secondary | ICD-10-CM | POA: Diagnosis not present

## 2017-11-01 DIAGNOSIS — L821 Other seborrheic keratosis: Secondary | ICD-10-CM | POA: Diagnosis not present

## 2017-11-01 DIAGNOSIS — L814 Other melanin hyperpigmentation: Secondary | ICD-10-CM | POA: Diagnosis not present

## 2017-11-22 ENCOUNTER — Encounter: Payer: Self-pay | Admitting: Gastroenterology

## 2018-01-21 ENCOUNTER — Ambulatory Visit (AMBULATORY_SURGERY_CENTER): Payer: Self-pay | Admitting: *Deleted

## 2018-01-21 VITALS — Ht 71.0 in | Wt 230.0 lb

## 2018-01-21 DIAGNOSIS — Z1211 Encounter for screening for malignant neoplasm of colon: Secondary | ICD-10-CM

## 2018-01-21 MED ORDER — NA SULFATE-K SULFATE-MG SULF 17.5-3.13-1.6 GM/177ML PO SOLN: 1.0000 | Freq: Once | ORAL | 0 refills | Status: AC
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
## 2018-01-26 ENCOUNTER — Other Ambulatory Visit: Payer: Self-pay | Admitting: Family Medicine

## 2018-02-04 ENCOUNTER — Encounter: Payer: Self-pay | Admitting: Gastroenterology

## 2018-02-04 ENCOUNTER — Ambulatory Visit (AMBULATORY_SURGERY_CENTER): Payer: Medicare Other | Admitting: Gastroenterology

## 2018-02-04 VITALS — BP 114/73 | HR 78 | Temp 98.9°F | Resp 19 | Ht 70.0 in | Wt 243.0 lb

## 2018-02-04 DIAGNOSIS — D122 Benign neoplasm of ascending colon: Secondary | ICD-10-CM

## 2018-02-04 DIAGNOSIS — I10 Essential (primary) hypertension: Secondary | ICD-10-CM | POA: Diagnosis not present

## 2018-02-04 DIAGNOSIS — Z1211 Encounter for screening for malignant neoplasm of colon: Secondary | ICD-10-CM | POA: Diagnosis not present

## 2018-02-04 LAB — HM COLONOSCOPY

## 2018-02-04 MED ORDER — SODIUM CHLORIDE 0.9 % IV SOLN: 500.0000 mL | Freq: Once | INTRAVENOUS | Status: DC

## 2018-02-04 NOTE — Progress Notes (Signed)
Called to room to assist during endoscopic procedure.  Patient ID and intended procedure confirmed with present staff. Received instructions for my participation in the procedure from the performing physician.  

## 2018-02-04 NOTE — Patient Instructions (Signed)
YOU HAD AN ENDOSCOPIC PROCEDURE TODAY AT THE [facility]:   Refer to the procedure report that was given to you for any specific questions about what was found during the examination.  If the procedure report does not answer your questions, please call your gastroenterologist to clarify.  If you requested that your care partner not be given the details of your procedure findings, then the procedure report has been included in a sealed envelope for you to review at your convenience later.  YOU SHOULD EXPECT: Some feelings of bloating in the abdomen. Passage of more gas than usual.  Walking can help get rid of the air that was put into your GI tract during the procedure and reduce the bloating. If you had a lower endoscopy (such as a colonoscopy or flexible sigmoidoscopy) you may notice spotting of blood in your stool or on the toilet paper. If you underwent a bowel prep for your procedure, you may not have a normal bowel movement for a few days.  Please Note:  You might notice some irritation and congestion in your nose or some drainage.  This is from the oxygen used during your procedure.  There is no need for concern and it should clear up in a day or so.  SYMPTOMS TO REPORT IMMEDIATELY:   Following lower endoscopy (colonoscopy or flexible sigmoidoscopy):  Excessive amounts of blood in the stool  Significant tenderness or worsening of abdominal pains  Swelling of the abdomen that is new, acute  Fever of 100F or higher   For urgent or emergent issues, a gastroenterologist can be reached at any hour by calling [phone].   DIET:  We do recommend a small meal at first, but then you may proceed to your regular diet.  Drink plenty of fluids but you should avoid alcoholic beverages for 24 hours.  ACTIVITY:  You should plan to take it easy for the rest of today and you should NOT DRIVE or use heavy machinery until tomorrow (because of the sedation medicines used during the test).     FOLLOW UP: Our staff will call the number listed on your records the next business day following your procedure to check on you and address any questions or concerns that you may have regarding the information given to you following your procedure. If we do not reach you, we will leave a message.  However, if you are feeling well and you are not experiencing any problems, there is no need to return our call.  We will assume that you have returned to your regular daily activities without incident.  If any biopsies were taken you will be contacted by phone or by letter within the next 1-3 weeks.  Please call us at [phone] if you have not heard about the biopsies in 3 weeks.    SIGNATURES/CONFIDENTIALITY: You and/or your care partner have signed paperwork which will be entered into your electronic medical record.  These signatures attest to the fact that that the information above on your After Visit Summary has been reviewed and is understood.  Full responsibility of the confidentiality of this discharge information lies with you and/or your care-partner.    Handouts were given to your care partner on polyps and hemorrhoids. You may resume your current medications today. Await biopsy results. Please call if any questions or concerns.   

## 2018-02-04 NOTE — Progress Notes (Signed)
No problems noted in the recovery room. maw 

## 2018-02-04 NOTE — Progress Notes (Signed)
Report given to PACU, vss 

## 2018-02-04 NOTE — Op Note (Signed)
[facility] Patient Name: Chad Serrano Procedure Date: 02/04/2018 10:29 AM MRN: [id number] Endoscopist: Justice Britain , MD Age: 72 Referring MD:  Date of Birth: 1946/01/23 Gender: Male Account #: [id number] Procedure:                Colonoscopy Indications:              Screening for colorectal malignant neoplasm,                            Screening for colorectal malignant neoplasm (last                            colonoscopy was 10 years ago) Medicines:                Monitored Anesthesia Care Procedure:                Pre-Anesthesia Assessment:                           - Prior to the procedure, a History and Physical                            was performed, and patient medications and                            allergies were reviewed. The patient's tolerance of                            previous anesthesia was also reviewed. The risks                            and benefits of the procedure and the sedation                            options and risks were discussed with the patient.                            All questions were answered, and informed consent                            was obtained. Prior Anticoagulants: The patient has                            taken aspirin. ASA Grade Assessment: II - A patient                            with mild systemic disease. After reviewing the                            risks and benefits, the patient was deemed in                            satisfactory condition to undergo the procedure.  After obtaining informed consent, the colonoscope                            was passed under direct vision. Throughout the                            procedure, the patient's blood pressure, pulse, and                            oxygen saturations were monitored continuously. The                            Colonoscope was introduced through the anus and                            advanced to the the cecum,  identified by                            transillumination. The colonoscopy was performed                            without difficulty. The patient tolerated the                            procedure well. The quality of the bowel                            preparation was evaluated using the BBPS [facility]                            Bowel Preparation Scale) with scores of: Right                            Colon = 3, Transverse Colon = 3 and Left Colon = 3                            (entire mucosa seen well with no residual staining,                            small fragments of stool or opaque liquid). The                            total BBPS score equals 9. Scope In: 10:41:37 AM Scope Out: 10:53:34 AM Scope Withdrawal Time: 0 hours 9 minutes 35 seconds  Total Procedure Duration: 0 hours 11 minutes 57 seconds  Findings:                 The digital rectal exam was abnormal. Pertinent                            negatives include no palpable rectal lesions.                           The terminal ileum and ileocecal  valve appeared                            normal.                           A 2 mm polypoid lesion was found in the proximal                            ascending colon. The lesion was sessile. No                            bleeding was present. The polyp was removed with a                            jumbo cold forceps. Resection and retrieval were                            complete.                           Normal mucosa was found in the entire colon.                           Non-bleeding non-thrombosed internal hemorrhoids                            were found during retroflexion, during perianal                            exam and during digital exam. The hemorrhoids were                            Grade I (internal hemorrhoids that do not prolapse).                           Anal papilla was found. Complications:            No immediate complications. Estimated Blood Loss:      Estimated blood loss: none. Impression:               - Abnormal digital rectal exam.                           - The examined portion of the ileum was normal.                           - Polypoid lesion in the proximal ascending colon.                            Complete removal was accomplished.                           - Normal mucosa in the entire examined colon.                           -  Non-bleeding non-thrombosed internal hemorrhoids.                           - Anal papilla was found. Recommendation:           - The patient will be observed post-procedure,                            until all discharge criteria are met.                           - Discharge patient to home.                           - Patient has a contact number available for                            emergencies. The signs and symptoms of potential                            delayed complications were discussed with the                            patient. Return to normal activities tomorrow.                            Written discharge instructions were provided to the                            patient.                           - Resume previous diet.                           - Continue present medications.                           - Await pathology results.                           - Repeat colonoscopy in 5-10 years for surveillance                            based on pathology results and findings of                            adenomatou tissue.                           - The findings and recommendations were discussed                            with the patient.                           - The findings and recommendations were discussed  with the designated responsible adult. Justice Britain, MD 02/04/2018 10:58:45 AM

## 2018-02-05 ENCOUNTER — Telehealth: Payer: Self-pay | Admitting: *Deleted

## 2018-02-05 NOTE — Telephone Encounter (Signed)
  Follow up Call-  Call back number 02/04/2018  Post procedure Call Back phone  # [phone]  Permission to leave phone message Yes  Some recent data might be hidden     Patient questions:  Do you have a fever, pain , or abdominal swelling? No. Pain Score  0 *  Have you tolerated food without any problems? Yes.    Have you been able to return to your normal activities? Yes.    Do you have any questions about your discharge instructions: Diet   No. Medications  No. Follow up visit  No.  Do you have questions or concerns about your Care? No.  Actions: * If pain score is 4 or above: No action needed, pain <4.

## 2018-02-06 DIAGNOSIS — M48061 Spinal stenosis, lumbar region without neurogenic claudication: Secondary | ICD-10-CM | POA: Diagnosis not present

## 2018-02-07 ENCOUNTER — Encounter: Payer: Self-pay | Admitting: Gastroenterology

## 2018-03-10 DIAGNOSIS — M48061 Spinal stenosis, lumbar region without neurogenic claudication: Secondary | ICD-10-CM | POA: Diagnosis not present

## 2018-03-10 DIAGNOSIS — R030 Elevated blood-pressure reading, without diagnosis of hypertension: Secondary | ICD-10-CM | POA: Diagnosis not present

## 2018-03-10 DIAGNOSIS — Z6833 Body mass index (BMI) 33.0-33.9, adult: Secondary | ICD-10-CM | POA: Diagnosis not present

## 2018-03-20 DIAGNOSIS — Z23 Encounter for immunization: Secondary | ICD-10-CM | POA: Diagnosis not present

## 2018-05-08 DIAGNOSIS — M48061 Spinal stenosis, lumbar region without neurogenic claudication: Secondary | ICD-10-CM | POA: Diagnosis not present

## 2018-06-23 DIAGNOSIS — I10 Essential (primary) hypertension: Secondary | ICD-10-CM | POA: Diagnosis not present

## 2018-06-23 DIAGNOSIS — M48061 Spinal stenosis, lumbar region without neurogenic claudication: Secondary | ICD-10-CM | POA: Diagnosis not present

## 2018-06-23 DIAGNOSIS — Z6834 Body mass index (BMI) 34.0-34.9, adult: Secondary | ICD-10-CM | POA: Diagnosis not present

## 2018-08-07 DIAGNOSIS — M48061 Spinal stenosis, lumbar region without neurogenic claudication: Secondary | ICD-10-CM | POA: Diagnosis not present

## 2018-09-15 ENCOUNTER — Ambulatory Visit (INDEPENDENT_AMBULATORY_CARE_PROVIDER_SITE_OTHER): Payer: Medicare Other | Admitting: Family Medicine

## 2018-09-15 ENCOUNTER — Other Ambulatory Visit: Payer: Medicare Other

## 2018-09-15 ENCOUNTER — Other Ambulatory Visit: Payer: Self-pay

## 2018-09-15 ENCOUNTER — Ambulatory Visit
Admission: RE | Admit: 2018-09-15 | Discharge: 2018-09-15 | Disposition: A | Discharge status: Home / Self Care | Payer: Medicare Other | Source: Ambulatory Visit | Attending: Family Medicine | Admitting: Family Medicine

## 2018-09-15 DIAGNOSIS — R0600 Dyspnea, unspecified: Secondary | ICD-10-CM | POA: Diagnosis not present

## 2018-09-15 DIAGNOSIS — R05 Cough: Secondary | ICD-10-CM

## 2018-09-15 DIAGNOSIS — R053 Chronic cough: Secondary | ICD-10-CM

## 2018-09-15 NOTE — Progress Notes (Signed)
Subjective:    Patient ID: Chad Serrano, male    DOB: 1945-09-26, 73 y.o.   MRN: [id number]  HPI Patient is being seen today over the telephone.  He consents to be seen over the telephone.  He is currently at home.  I am currently in my office.  Phone call began at 924.  Phone call ended at 1036.  Patient states that he has had a cough now for 3 months.  The cough is dry the majority of the time however at times is productive of clear sputum.  It is constant throughout the day.  It does not wax or wane.  What has him concerned is the fact that over the last 2 3 weeks he is developed shortness of breath and dyspnea on exertion.  He denies any fever.  He denies any rhinorrhea.  He denies any pleurisy.  He denies any hemoptysis.  He denies any purulent sputum.  He denies any travel outside the country where he could have been exposed to TB.  He denies any fevers chills night sweats or body aches.  He denies any chest pain, orthopnea, paroxysmal nocturnal dyspnea.  However the shortness of breath is noticeable.  He will become winded walking to the mailbox.  He exercises every day and he is getting progressively more winded with his exercise.  He denies any angina or shortness of breath at rest Past Medical History:  Diagnosis Date  . Anxiety   . Chronic kidney disease   . Gout   . Hyperlipidemia   . Hypertension   . Hypogonadism male   . Hypogonadism male    Past Surgical History:  Procedure Laterality Date  . APPENDECTOMY    . COLONOSCOPY    . ROTATOR CUFF REPAIR Right    Current Outpatient Medications on File Prior to Visit  Medication Sig Dispense Refill  . allopurinol (ZYLOPRIM) 100 MG tablet TAKE 2 TABLETS BY MOUTH EVERY DAY 180 tablet 3  . aspirin 81 MG tablet Take 81 mg by mouth daily.    . Cholecalciferol (VITAMIN D3) 2000 UNITS TABS Take by mouth.    Marland Kitchen GINSENG PO Take by mouth. Triple Ginsa    . L-Arginine 500 MG TABS Take by mouth.    . losartan (COZAAR) 100 MG tablet TAKE 1  TABLET BY MOUTH DAILY 90 tablet 2  . losartan (COZAAR) 100 MG tablet TAKE 1 TABLET BY MOUTH EVERY DAY 90 tablet 2  . Magnesium 500 MG CAPS Take by mouth.    . Multiple Vitamins-Minerals (CENTRUM SILVER ADULT 50+) TABS Take by mouth.    . Omega-3 Fatty Acids (FISH OIL) 1000 MG CAPS Take by mouth.    . sildenafil (VIAGRA) 100 MG tablet Take 100 mg by mouth daily as needed.      . traMADol (ULTRAM) 50 MG tablet Take 50 mg by mouth every 12 (twelve) hours as needed for moderate pain.    . vitamin B-12 (CYANOCOBALAMIN) 1000 MCG tablet Take 3,000 mcg by mouth daily.    . Zinc 50 MG TABS Take by mouth.     No current facility-administered medications on file prior to visit.    No Known Allergies Social History   Socioeconomic History  . Marital status: Married    Spouse name: Not on file  . Number of children: Not on file  . Years of education: Not on file  . Highest education level: Not on file  Occupational History  . Not on file  Social  Needs  . Financial resource strain: Not on file  . Food insecurity:    Worry: Not on file    Inability: Not on file  . Transportation needs:    Medical: Not on file    Non-medical: Not on file  Tobacco Use  . Smoking status: Never Smoker  . Smokeless tobacco: Never Used  Substance and Sexual Activity  . Alcohol use: No  . Drug use: No  . Sexual activity: Not on file    Comment: married to [place].  Retired.  Weightlifter  Lifestyle  . Physical activity:    Days per week: Not on file    Minutes per session: Not on file  . Stress: Not on file  Relationships  . Social connections:    Talks on phone: Not on file    Gets together: Not on file    Attends religious service: Not on file    Active member of club or organization: Not on file    Attends meetings of clubs or organizations: Not on file    Relationship status: Not on file  . Intimate partner violence:    Fear of current or ex partner: Not on file    Emotionally abused: Not on file     Physically abused: Not on file    Forced sexual activity: Not on file  Other Topics Concern  . Not on file  Social History Narrative  . Not on file      Review of Systems  All other systems reviewed and are negative.      Objective:   Physical Exam  Physical exam could not be performed today as the patient was seen over the telephone      Assessment & Plan:  Chronic cough - Plan: CBC with Differential/Platelet, COMPLETE METABOLIC PANEL WITH GFR, Brain natriuretic peptide, D-dimer, quantitative (not at [facility]), DG Chest 2 View  Dyspnea, unspecified type - Plan: CBC with Differential/Platelet, COMPLETE METABOLIC PANEL WITH GFR, Brain natriuretic peptide, D-dimer, quantitative (not at [facility]), DG Chest 2 View  Begin by obtaining a CBC to evaluate for anemia or other underlying bone marrow abnormalities such as leukemia.  Check CMP to evaluate for renal or liver dysfunction.  Check a BNP to evaluate for signs of congestive heart failure.  Check a d-dimer to evaluate for possible DVT or pulmonary embolism.  Obtain a chest x-ray to evaluate for any pulmonary abnormalities.  Once I have this initial work-up performed, I would like to see the patient back in clinic for a physical exam including an EKG this cause is found on the initial labs and x-ray evaluation.  Given his age, I am concerned about possible underlying cardiovascular disease as a cause of his dyspnea on exertion.

## 2018-09-16 ENCOUNTER — Other Ambulatory Visit: Payer: Self-pay | Admitting: Family Medicine

## 2018-09-16 ENCOUNTER — Other Ambulatory Visit: Payer: Self-pay

## 2018-09-16 ENCOUNTER — Ambulatory Visit (INDEPENDENT_AMBULATORY_CARE_PROVIDER_SITE_OTHER): Payer: Medicare Other | Admitting: Family Medicine

## 2018-09-16 ENCOUNTER — Ambulatory Visit (HOSPITAL_COMMUNITY)
Admission: RE | Admit: 2018-09-16 | Discharge: 2018-09-16 | Disposition: A | Discharge status: Home / Self Care | Payer: Medicare Other | Source: Ambulatory Visit | Attending: Family Medicine | Admitting: Family Medicine

## 2018-09-16 VITALS — BP 152/92 | HR 94 | Temp 98.5°F | Wt 242.0 lb

## 2018-09-16 DIAGNOSIS — R0602 Shortness of breath: Secondary | ICD-10-CM

## 2018-09-16 DIAGNOSIS — R7989 Other specified abnormal findings of blood chemistry: Secondary | ICD-10-CM

## 2018-09-16 DIAGNOSIS — J189 Pneumonia, unspecified organism: Secondary | ICD-10-CM

## 2018-09-16 DIAGNOSIS — J181 Lobar pneumonia, unspecified organism: Secondary | ICD-10-CM

## 2018-09-16 LAB — CBC WITH DIFFERENTIAL/PLATELET
Absolute Monocytes: 851 cells/uL (ref 200–950)
Basophils Absolute: 57 cells/uL (ref 0–200)
Basophils Relative: 0.7 %
Eosinophils Absolute: 138 cells/uL (ref 15–500)
Eosinophils Relative: 1.7 %
HCT: 44.3 % (ref 38.5–50.0)
Hemoglobin: 15.2 g/dL (ref 13.2–17.1)
Lymphs Abs: 2600 cells/uL (ref 850–3900)
MCH: 32.5 pg (ref 27.0–33.0)
MCHC: 34.3 g/dL (ref 32.0–36.0)
MCV: 94.9 fL (ref 80.0–100.0)
MPV: 11.1 fL (ref 7.5–12.5)
Monocytes Relative: 10.5 %
Neutro Abs: 4455 cells/uL (ref 1500–7800)
Neutrophils Relative %: 55 %
Platelets: 217 10*3/uL (ref 140–400)
RBC: 4.67 10*6/uL (ref 4.20–5.80)
RDW: 12.2 % (ref 11.0–15.0)
Total Lymphocyte: 32.1 %
WBC: 8.1 10*3/uL (ref 3.8–10.8)

## 2018-09-16 LAB — COMPLETE METABOLIC PANEL WITH GFR
AG Ratio: 1.5 (calc) (ref 1.0–2.5)
ALT: 21 U/L (ref 9–46)
AST: 23 U/L (ref 10–35)
Albumin: 3.9 g/dL (ref 3.6–5.1)
Alkaline phosphatase (APISO): 71 U/L (ref 35–144)
BUN/Creatinine Ratio: 11 (calc) (ref 6–22)
BUN: 16 mg/dL (ref 7–25)
CO2: 26 mmol/L (ref 20–32)
Calcium: 9 mg/dL (ref 8.6–10.3)
Chloride: 102 mmol/L (ref 98–110)
Creat: 1.43 mg/dL — ABNORMAL HIGH (ref 0.70–1.18)
GFR, Est African American: 56 mL/min/{1.73_m2} — ABNORMAL LOW (ref >=60)
GFR, Est Non African American: 49 mL/min/{1.73_m2} — ABNORMAL LOW (ref >=60)
Globulin: 2.6 g/dL (calc) (ref 1.9–3.7)
Glucose, Bld: 149 mg/dL — ABNORMAL HIGH (ref 65–99)
Potassium: 4 mmol/L (ref 3.5–5.3)
Sodium: 139 mmol/L (ref 135–146)
Total Bilirubin: 0.6 mg/dL (ref 0.2–1.2)
Total Protein: 6.5 g/dL (ref 6.1–8.1)

## 2018-09-16 LAB — D-DIMER, QUANTITATIVE: D-Dimer, Quant: 0.72 mcg/mL FEU — ABNORMAL HIGH (ref <0.50)

## 2018-09-16 LAB — BRAIN NATRIURETIC PEPTIDE: Brain Natriuretic Peptide: 10 pg/mL (ref <100)

## 2018-09-16 MED ORDER — CEFTRIAXONE SODIUM 1 G IJ SOLR
1.0000 g | Freq: Once | INTRAMUSCULAR | Status: AC
  Administered 2018-09-16: 1 g via INTRAMUSCULAR

## 2018-09-16 MED ORDER — AZITHROMYCIN 250 MG PO TABS: ORAL_TABLET | ORAL | 0 refills | Status: DC

## 2018-09-16 MED ORDER — AMOXICILLIN-POT CLAVULANATE ER 1000-62.5 MG PO TB12: 2.0000 | ORAL_TABLET | Freq: Two times a day (BID) | ORAL | 0 refills | Status: DC

## 2018-09-16 MED ORDER — IOHEXOL 350 MG/ML SOLN
100.0000 mL | Freq: Once | INTRAVENOUS | Status: AC | PRN
  Administered 2018-09-16: 11:00:00 100 mL via INTRAVENOUS
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
## 2018-09-16 NOTE — Addendum Note (Signed)
Addended by: Shary Decamp B on: 09/16/2018 01:58 PM   Modules accepted: Orders

## 2018-09-23 ENCOUNTER — Encounter: Payer: Self-pay | Admitting: Family Medicine

## 2018-09-23 ENCOUNTER — Ambulatory Visit (INDEPENDENT_AMBULATORY_CARE_PROVIDER_SITE_OTHER): Payer: Medicare Other | Admitting: Family Medicine

## 2018-09-23 ENCOUNTER — Other Ambulatory Visit: Payer: Self-pay

## 2018-09-23 VITALS — BP 168/90 | HR 104 | Temp 98.1°F | Resp 16 | Ht 70.5 in | Wt 239.0 lb

## 2018-09-23 DIAGNOSIS — R0602 Shortness of breath: Secondary | ICD-10-CM | POA: Diagnosis not present

## 2018-09-23 DIAGNOSIS — J181 Lobar pneumonia, unspecified organism: Secondary | ICD-10-CM | POA: Diagnosis not present

## 2018-09-23 DIAGNOSIS — J679 Hypersensitivity pneumonitis due to unspecified organic dust: Secondary | ICD-10-CM | POA: Diagnosis not present

## 2018-09-23 DIAGNOSIS — J189 Pneumonia, unspecified organism: Secondary | ICD-10-CM
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
## 2018-10-02 ENCOUNTER — Ambulatory Visit (INDEPENDENT_AMBULATORY_CARE_PROVIDER_SITE_OTHER): Payer: Medicare Other | Admitting: Internal Medicine

## 2018-10-02 ENCOUNTER — Ambulatory Visit (INDEPENDENT_AMBULATORY_CARE_PROVIDER_SITE_OTHER): Payer: Medicare Other

## 2018-10-02 ENCOUNTER — Encounter: Payer: Self-pay | Admitting: Internal Medicine

## 2018-10-02 ENCOUNTER — Other Ambulatory Visit: Payer: Self-pay

## 2018-10-02 VITALS — BP 144/80 | HR 107 | Temp 98.1°F | Ht 71.0 in | Wt 237.0 lb

## 2018-10-02 DIAGNOSIS — R05 Cough: Secondary | ICD-10-CM | POA: Diagnosis not present

## 2018-10-02 DIAGNOSIS — R918 Other nonspecific abnormal finding of lung field: Secondary | ICD-10-CM

## 2018-10-02 NOTE — Progress Notes (Signed)
Chad Serrano, male    DOB: 03/02/1946,    MRN: [id number]   Brief patient profile:  65 yowm never smoker regularly works out 5 days / week 2 h/day then Jan 2020 indolent onset cough esp when exercise but not sleeping then sob then mid April 2020 noted also doe and CTa 09/16/2018  c/w RML density / TNTC pulmonary nodules so referred to pulmonary clinic 10/02/2018 by Dr   Dennard Schaumann     History of Present Illness  10/02/2018  Pulmonary/ 1st office eval/Tyneshia Stivers  Chief Complaint  Patient presents with  . Pulmonary Consult    Referred by Dr. Dennard Schaumann- recent PNA and SOB.   Dyspnea:  Improving p finished abx now able to do mb and back s sob where wasn't before Cough: about the same- non productive Sleep: fine flat  SABA use: none  Chills x 3-4 days / no sweats   No obvious day to day or daytime variability or assoc excess/ purulent sputum or mucus plugs or hemoptysis or cp or chest tightness, subjective wheeze or overt sinus or hb symptoms.   Sleeping fine flat  without nocturnal  or early am exacerbation  of respiratory  c/o's or need for noct saba. Also denies any obvious fluctuation of symptoms with weather or environmental changes or other aggravating or alleviating factors except as outlined above   No unusual exposure hx or h/o childhood pna/ asthma or knowledge of premature birth.  Current Allergies, Complete Past Medical History, Past Surgical History, Family History, and Social History were reviewed in [employer] record.  ROS  The following are not active complaints unless bolded Hoarseness, sore throat, dysphagia, dental problems, itching, sneezing,  nasal congestion or discharge of excess mucus or purulent secretions, ear ache,   fever, chills, sweats, unintended wt loss or wt gain, classically pleuritic or exertional cp,  orthopnea pnd or arm/hand swelling  or leg swelling, presyncope, palpitations, abdominal pain, anorexia, nausea, vomiting, diarrhea  or change  in bowel habits or change in bladder habits, change in stools or change in urine, dysuria, hematuria,  rash, arthralgias, visual complaints, headache, numbness, weakness or ataxia or problems with walking or coordination,  change in mood or  memory.            Past Medical History:  Diagnosis Date  . Anxiety   . Chronic kidney disease   . Gout   . Hyperlipidemia   . Hypertension   . Hypogonadism male   . Hypogonadism male     Outpatient Medications Prior to Visit  Medication Sig Dispense Refill  . allopurinol (ZYLOPRIM) 100 MG tablet TAKE 2 TABLETS BY MOUTH EVERY DAY 180 tablet 3  . aspirin 81 MG tablet Take 81 mg by mouth daily.    . Cholecalciferol (VITAMIN D3) 2000 UNITS TABS Take by mouth.    Marland Kitchen GINSENG PO Take by mouth. Triple Ginsa    . L-Arginine 500 MG TABS Take by mouth.    . losartan (COZAAR) 100 MG tablet TAKE 1 TABLET BY MOUTH DAILY 90 tablet 2  . Magnesium 500 MG CAPS Take by mouth.    . Multiple Vitamins-Minerals (CENTRUM SILVER ADULT 50+) TABS Take by mouth.    . Omega-3 Fatty Acids (FISH OIL) 1000 MG CAPS Take by mouth.    . traMADol (ULTRAM) 50 MG tablet Take 50 mg by mouth every 12 (twelve) hours as needed for moderate pain.    . vitamin B-12 (CYANOCOBALAMIN) 1000 MCG tablet Take 3,000 mcg  by mouth daily.    . Zinc 50 MG TABS Take by mouth.    Marland Kitchen amoxicillin-clavulanate (AUGMENTIN XR) 1000-62.5 MG 12 hr tablet Take 2 tablets by mouth 2 (two) times daily. 40 tablet 0  . losartan (COZAAR) 100 MG tablet TAKE 1 TABLET BY MOUTH EVERY DAY 90 tablet 2  . sildenafil (VIAGRA) 100 MG tablet Take 100 mg by mouth daily as needed.          Objective:     BP (!) 144/80 (BP Location: Left Arm, Cuff Size: Normal)   Pulse (!) 107   Temp 98.1 F (36.7 C) (Oral)   Ht _0  (1.803 m)   Wt 237 lb (107.5 kg)   SpO2 96%   BMI 33.05 kg/m   SpO2: 96 %  RA  amb wm nad   HEENT: nl dentition, turbinates bilaterally, and oropharynx. Nl external ear canals without cough  reflex   NECK :  without JVD/Nodes/TM/ nl carotid upstrokes bilaterally   LUNGS: no acc muscle use,  Nl contour chest which is clear to A and P bilaterally without cough on insp or exp maneuvers   CV:  RRR  no s3 or murmur or increase in P2, and no edema   ABD:  soft and nontender with nl inspiratory excursion in the supine position. No bruits or organomegaly appreciated, bowel sounds nl  MS:  Nl gait/ ext warm without deformities, calf tenderness, cyanosis or clubbing No obvious joint restrictions   SKIN: warm and dry without lesions    NEURO:  alert, approp, nl sensorium with  no motor or cerebellar deficits apparent.      I personally reviewed images and agree with radiology impression as follows:   Chest CT 09/16/2018 1.  Negative examination for pulmonary embolism.  2.  No evidence of pneumothorax.  3. There are innumerable tiny centrilobular pulmonary nodules without clear apical to basal gradient, most consistent with atypical infection. Inflammatory inhalational lung disease is a differential consideration, including acute hypersensitivity Pneumonitis   CXR PA and Lateral:   10/02/2018 :    I personally reviewed images and agree with radiology impression as follows:   Previously identified infiltrates on CT are not definitely identified on the chest x-ray. Lungs are essentially clear   Labs ordered/ reviewed:      Chemistry      Component Value Date/Time   NA 139 09/15/2018 1013   K 4.0 09/15/2018 1013   CL 102 09/15/2018 1013   CO2 26 09/15/2018 1013   BUN 16 09/15/2018 1013   CREATININE 1.43 (H) 09/15/2018 1013      Component Value Date/Time   CALCIUM 9.0 09/15/2018 1013   ALKPHOS 63 06/27/2016 0846   AST 23 09/15/2018 1013   ALT 21 09/15/2018 1013   BILITOT 0.6 09/15/2018 1013        Lab Results  Component Value Date   WBC 5.9 10/02/2018   HGB 15.1 10/02/2018   HCT 43.2 10/02/2018   MCV 95.3 10/02/2018   PLT 187.0 10/02/2018       Eos                                                                  0.2  10/02/2018         Lab Results  Component Value Date   ESRSEDRATE 13 10/02/2018     ANCA  Serology 10/02/2018 neg Covid IgG ab 10/02/2018  = neg  Quant TB 10/02/2018 neg  ACE 10/02/2018  32 Labs ordered 10/02/2018  HSP Blasto, cypto serologies     Assessment   Pulmonary infiltrates Onset of cough Jan 2020 then mid April 2020  -CT 09/16/2018  c/w RML density / TNTC pulmonary nodules> rx augmentin and clinically improved  - cxr 10/02/2018 wnl with esr only 13  ANCA  Serology 10/02/2018 neg Covid IgG ab 10/02/2018  = neg  Quant TB 10/02/2018 neg  ACE 10/02/2018  32 Labs ordered 10/02/2018  HSP Blasto, cypto serologies  The hx is difficult to reconcile with the ct of 09/16/18 unless he has two separate problems as it appears he may have had CAP that responded to abx but that would not explain the underlying nodular lung dz or the sob x Jan 2020 in this previously very fit individual    Since there are no "leads" on initial studies and he's feeling better since he finished abx will hold off on additional studies for now (pfts/fob not being done right now for ILD anyway due to covid19 restrictions) and regroup with the pt in 2 weeks with walking sats on return and consider HRCT next if not improving.   Total time devoted to counseling  > 50 % of initial 60 min office visit:  review case with pt/ discussion of options/alternatives/ personally creating written customized instructions  in presence of pt  then going over those specific  Instructions directly with the pt including how to use all of the meds but in particular covering each new medication in detail and the difference between the maintenance= "automatic" meds and the prns using an action plan format for the latter (If this problem/symptom => do that organization reading Left to right).  Please see AVS from this visit for a full list of these  instructions which I personally wrote for this pt and  are unique to this visit.      Christinia Gully, MD 10/02/2018

## 2018-10-02 NOTE — Patient Instructions (Signed)
Please remember to go to the lab and x-ray department   for your tests - we will call you with the results when they are available.     Please schedule a follow up office visit in 2 weeks, sooner if needed

## 2018-10-03 LAB — CBC WITH DIFFERENTIAL/PLATELET
Basophils Absolute: 0.1 10*3/uL (ref 0.0–0.1)
Basophils Relative: 1.1 % (ref 0.0–3.0)
Eosinophils Absolute: 0.2 10*3/uL (ref 0.0–0.7)
Eosinophils Relative: 3.5 % (ref 0.0–5.0)
HCT: 43.2 % (ref 39.0–52.0)
Hemoglobin: 15.1 g/dL (ref 13.0–17.0)
Lymphocytes Relative: 45.1 % (ref 12.0–46.0)
Lymphs Abs: 2.6 10*3/uL (ref 0.7–4.0)
MCHC: 35.1 g/dL (ref 30.0–36.0)
MCV: 95.3 fl (ref 78.0–100.0)
Monocytes Absolute: 0.8 10*3/uL (ref 0.1–1.0)
Monocytes Relative: 12.9 % — ABNORMAL HIGH (ref 3.0–12.0)
Neutro Abs: 2.2 10*3/uL (ref 1.4–7.7)
Neutrophils Relative %: 37.4 % — ABNORMAL LOW (ref 43.0–77.0)
Platelets: 187 10*3/uL (ref 150.0–400.0)
RBC: 4.53 Mil/uL (ref 4.22–5.81)
RDW: 13.2 % (ref 11.5–15.5)
WBC: 5.9 10*3/uL (ref 4.0–10.5)

## 2018-10-03 LAB — SEDIMENTATION RATE: Sed Rate: 13 mm/hr (ref 0–20)

## 2018-10-03 NOTE — Progress Notes (Signed)
Spoke with pt and notified of results per Dr. Wert. Pt verbalized understanding and denied any questions. 

## 2018-10-06 ENCOUNTER — Encounter: Payer: Self-pay | Admitting: Internal Medicine

## 2018-10-06 ENCOUNTER — Other Ambulatory Visit: Payer: Self-pay | Admitting: Family Medicine

## 2018-10-06 NOTE — Assessment & Plan Note (Signed)
Onset of cough Jan 2020 then mid April 2020  -CT 09/16/2018  c/w RML density / TNTC pulmonary nodules> rx augmentin and clinically improved  - cxr 10/02/2018 wnl with esr only 13  ANCA  Serology 10/02/2018 neg Covid IgG ab 10/02/2018  = neg  Quant TB 10/02/2018 neg  ACE 10/02/2018  32 Labs ordered 10/02/2018  HSP Blasto, cypto serologies  The hx is difficult to reconcile with the ct of 09/16/18 unless he has two separate problems as it appears he may have had CAP that responded to abx but that would not explain the underlying nodular lung dz or the sob x Jan 2020 in this previously very fit individual    Since there are no "leads" on initial studies and he's feeling better since he finished abx will hold off on additional studies for now (pfts/fob not being done right now for ILD anyway due to covid19 restrictions) and regroup with the pt in 2 weeks with walking sats on return and consider HRCT next if not improving.   Total time devoted to counseling  > 50 % of initial 60 min office visit:  review case with pt/ discussion of options/alternatives/ personally creating written customized instructions  in presence of pt  then going over those specific  Instructions directly with the pt including how to use all of the meds but in particular covering each new medication in detail and the difference between the maintenance= "automatic" meds and the prns using an action plan format for the latter (If this problem/symptom => do that organization reading Left to right).  Please see AVS from this visit for a full list of these instructions which I personally wrote for this pt and  are unique to this visit.

## 2018-10-07 LAB — ANCA SCREEN W REFLEX TITER: ANCA Screen: NEGATIVE

## 2018-10-07 LAB — HYPERSENSITIVITY PNUEMONITIS PROFILE
ASPERGILLUS FUMIGATUS: NEGATIVE
Faenia retivirgula: NEGATIVE
Pigeon Serum: NEGATIVE
S. VIRIDIS: NEGATIVE
T. CANDIDUS: NEGATIVE
T. VULGARIS: NEGATIVE

## 2018-10-07 LAB — QUANTIFERON-TB GOLD PLUS
Mitogen-NIL: >10 IU/mL
NIL: 0.04 IU/mL
QuantiFERON-TB Gold Plus: NEGATIVE
TB1-NIL: 0.02 IU/mL
TB2-NIL: 0 IU/mL

## 2018-10-07 LAB — SAR COV2 SEROLOGY (COVID19)AB(IGG),IA: SARS CoV2 AB IGG: NEGATIVE

## 2018-10-07 LAB — ANGIOTENSIN CONVERTING ENZYME: Angiotensin-Converting Enzyme: 32 U/L (ref 9–67)

## 2018-10-08 NOTE — Progress Notes (Signed)
Spoke with pt and notified of results per Dr. Wert. Pt verbalized understanding and denied any questions. 

## 2018-10-09 LAB — CRYPTOCOCCAL ANTIGEN: Cryptococcus Antigen, Serum: NEGATIVE

## 2018-10-09 LAB — BLASTOMYCES ANTIGEN: Blastomyces Antigen: NOT DETECTED ng/mL

## 2018-10-16 DIAGNOSIS — M48061 Spinal stenosis, lumbar region without neurogenic claudication: Secondary | ICD-10-CM | POA: Diagnosis not present

## 2018-10-20 ENCOUNTER — Encounter: Payer: Self-pay | Admitting: Internal Medicine

## 2018-10-20 ENCOUNTER — Other Ambulatory Visit: Payer: Self-pay

## 2018-10-20 ENCOUNTER — Ambulatory Visit (INDEPENDENT_AMBULATORY_CARE_PROVIDER_SITE_OTHER): Payer: Medicare Other | Admitting: Internal Medicine

## 2018-10-20 VITALS — BP 132/80 | HR 107 | Temp 98.2°F | Ht 71.0 in | Wt 235.8 lb

## 2018-10-20 DIAGNOSIS — R918 Other nonspecific abnormal finding of lung field: Secondary | ICD-10-CM

## 2018-10-20 NOTE — Progress Notes (Signed)
Chad Serrano, male    DOB: 27-Jun-1945,    MRN: [id number]   Brief patient profile:  49 yowm never smoker regularly works out 5 days / week 2 h/day then Jan 2020 indolent onset cough esp when exercise but not sleeping then sob then mid April 2020 noted also doe and CTa 09/16/2018  c/w RML density / TNTC pulmonary nodules so referred to pulmonary clinic 10/02/2018 by Dr   Dennard Schaumann     History of Present Illness  10/02/2018  Pulmonary/ 1st office eval/Annaleigha Woo  Chief Complaint  Patient presents with  . Pulmonary Consult    Referred by Dr. Dennard Schaumann- recent PNA and SOB.   Dyspnea:  Improving p finished abx now able to do mb and back s sob where wasn't before Cough: about the same- non productive Sleep: fine flat  SABA use: none  Chills x 3-4 days / no sweats  rec No change rx    10/20/2018  f/u ov/Alanny Rivers re: cough x Jan 2020 but no more chills and cough better during the day than it was Chief Complaint  Patient presents with  . Follow-up    Patient states that his breathing is about the same since last visit, no worse, no better. Dry couch has gotten better.  Dyspnea:  mb and back fine  Cough: mostly p breakfast assoc with new pnds has not tried [employer]: flat one pillow  SABA use: none  Using IS and "goes to top"  s provoking cough  Uses tramadol for back problems not for cough    No obvious day to day or daytime variability or assoc excess/ purulent sputum or mucus plugs or hemoptysis or cp or chest tightness, subjective wheeze or overt sinus or hb symptoms.   Sleeping fine as above  without nocturnal  or early am exacerbation  of respiratory  c/o's or need for noct saba. Also denies any obvious fluctuation of symptoms with weather or environmental changes or other aggravating or alleviating factors except as outlined above   No unusual exposure hx or h/o childhood pna/ asthma or knowledge of premature birth.  Current Allergies, Complete Past Medical History, Past Surgical  History, Family History, and Social History were reviewed in [employer] record.  ROS  The following are not active complaints unless bolded Hoarseness, sore throat, dysphagia, dental problems, itching, sneezing,  nasal congestion or discharge of excess mucus or purulent secretions, ear ache,   fever, chills, sweats, unintended wt loss or wt gain, classically pleuritic or exertional cp,  orthopnea pnd or arm/hand swelling  or leg swelling, presyncope, palpitations, abdominal pain, anorexia, nausea, vomiting, diarrhea  or change in bowel habits or change in bladder habits, change in stools or change in urine, dysuria, hematuria,  rash, arthralgias, visual complaints, headache, numbness, weakness or ataxia or problems with walking or coordination,  change in mood or  memory.        Current Meds  Medication Sig  . allopurinol (ZYLOPRIM) 100 MG tablet TAKE 2 TABLETS BY MOUTH EVERY DAY  . aspirin 81 MG tablet Take 81 mg by mouth daily.  . Cholecalciferol (VITAMIN D3) 2000 UNITS TABS Take by mouth.  Marland Kitchen GINSENG PO Take by mouth. Triple Ginsa  . L-Arginine 500 MG TABS Take by mouth.  . losartan (COZAAR) 100 MG tablet TAKE 1 TABLET BY MOUTH DAILY  . Magnesium 500 MG CAPS Take by mouth.  . Multiple Vitamins-Minerals (CENTRUM SILVER ADULT 50+) TABS Take by mouth.  . Omega-3 Fatty  Acids (FISH OIL) 1000 MG CAPS Take by mouth.  . traMADol (ULTRAM) 50 MG tablet Take 50 mg by mouth every 12 (twelve) hours as needed for moderate pain.  . vitamin B-12 (CYANOCOBALAMIN) 1000 MCG tablet Take 3,000 mcg by mouth daily.  . Zinc 50 MG TABS Take by mouth.              Objective:    amb wm nad   Wt Readings from Last 3 Encounters:  10/20/18 235 lb 12.8 oz (107 kg)  10/02/18 237 lb (107.5 kg)  09/23/18 239 lb (108.4 kg)     Vital signs reviewed - Note on arrival 02 sats  94% on RA     HEENT: nl dentition, turbinates bilaterally, and oropharynx. Nl external ear canals without cough  reflex   NECK :  without JVD/Nodes/TM/ nl carotid upstrokes bilaterally   LUNGS: no acc muscle use,  Nl contour chest which is clear to A and P bilaterally without cough on insp or exp maneuvers   CV:  RRR  no s3 or murmur or increase in P2, and no edema   ABD:  soft and nontender with nl inspiratory excursion in the supine position. No bruits or organomegaly appreciated, bowel sounds nl  MS:  Nl gait/ ext warm without deformities, calf tenderness, cyanosis or clubbing No obvious joint restrictions   SKIN: warm and dry without lesions    NEURO:  alert, approp, nl sensorium with  no motor or cerebellar deficits apparent.              Assessment

## 2018-10-20 NOTE — Patient Instructions (Signed)
Try zyrtec 10 mg at bedtime to see if helps with the nasal drainage issues in the morning     To get the most out of exercise, you need to be continuously aware that you are short of breath, but never out of breath, for 30 minutes daily. As you improve, it will actually be easier for you to do the same amount of exercise  in  30 minutes so always push to the level where you are short of breath.      We will call to schedule you a HRCT in about mid June to compare to previous studies - if exercise tolerance and cough are getting back to normal ok to call to cancel

## 2018-10-20 NOTE — Assessment & Plan Note (Addendum)
Onset of cough Jan 2020 then assoc doe mid April 2020  -CT 09/16/2018  c/w RML density / TNTC pulmonary nodules> rx augmentin and clinically improved  - cxr 10/02/2018 wnl with esr only 13  ANCA  Serology 10/02/2018 neg Covid IgG ab 10/02/2018  = neg  Quant TB 10/02/2018 neg  ACE 10/02/2018  32 Labs ordered 10/02/2018  HSP cypto serologies  Neg - blasto serology 10/02/2018 >>> neg   10/20/2018   Walked RA  2 laps @ approx 223f each @ fast pace  stopped due to end of study, no desats, min sob  HRCT chest for mid June 2020 >>>  He is convinced the cough is better and where he was struggling to get to the mailbox and back he is now able to do that much easier with residual dyspnea that may just be conditioning.  I am at a loss to explain however the underlying micronodular lung disease that is suggested by previous CT scan and I would recommend he have a comparison high resolution scan in 2 weeks if not making steady progress with reconditioning as described in the AVS.  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  If this problem is not better a sinus CT scan could be added to the next imaging studies.   Discussed in detail all the  indications, usual  risks and alternatives  relative to the benefits with patient who agrees to proceed with conservative f/u as outlined     I had an extended discussion with the patient reviewing all relevant studies completed to date and  lasting 15 to 20 minutes of a 25 minute visit  which included directly observing ambulatory 02 saturation study documented in a/p section of  today's  office note.  Each maintenance medication was reviewed in detail including most importantly the difference between maintenance and prns and under what circumstances the prns are  to be triggered using an action plan format that is not reflected in the computer generated alphabetically organized AVS.     Please see AVS for specific instructions unique to this visit that I personally wrote and verbalized to the the pt in detail and then reviewed with pt  by my nurse highlighting any changes in therapy recommended at today's visit

## 2018-10-31 ENCOUNTER — Telehealth: Payer: Self-pay | Admitting: Internal Medicine

## 2018-10-31 MED ORDER — AZELASTINE HCL 0.1 % NA SOLN: 2.0000 | Freq: Two times a day (BID) | NASAL | 1 refills | Status: DC

## 2018-10-31 MED ORDER — BENZONATATE 200 MG PO CAPS: 200.0000 mg | ORAL_CAPSULE | Freq: Three times a day (TID) | ORAL | 1 refills | Status: DC | PRN

## 2018-10-31 NOTE — Telephone Encounter (Signed)
Advise delsym cough syrup twice daily. Continue Zyrtec and will add nasal spray called azelastine. RX tessalon perles TID prn cough. Follow up with DR. Wert if not better.

## 2018-10-31 NOTE — Telephone Encounter (Signed)
Called pt and advised message from the provider. Pt understood and verbalized understanding. Nothing further is needed.    

## 2018-10-31 NOTE — Telephone Encounter (Signed)
Primary Pulmonologist: MW Last office visit and with whom: 10/20/2018 with MW What do we see them for (pulmonary problems): pulm infiltrates Last OV assessment/plan: Instructions  Try zyrtec 10 mg at bedtime to see if helps with the nasal drainage issues in the morning     To get the most out of exercise, you need to be continuously aware that you are short of breath, but never out of breath, for 30 minutes daily. As you improve, it will actually be easier for you to do the same amount of exercise  in  30 minutes so always push to the level where you are short of breath.      We will call to schedule you a HRCT in about mid June to compare to previous studies - if exercise tolerance and cough are getting back to normal ok to call to cancel     Was appointment offered to patient (explain)?  Pt wants recommendations   Reason for call: Called and spoke with pt who stated he began having a worsening cough over the last 2-3 days. Pt states he will begin to cough and then cannot quit coughing. Pt stated his cough is worse in the mornings and at night when laying down at night. Pt states his cough is a dry cough.pt states his cough is worse compared to how it was at last OV with MW 6/1.  Pt is also having postnasal drainage in the mornings and also when he is getting ready for bed and states the mucus is clear.  Pt states he has been taking zyrtec which was last prescribed by MW but states that is not helping.  Pt states when he wakes up in the morning, he will wake up with a sore throat due to the amount of coughing he did the night before.  Pt denies any complaints of fever and states his last temp today was 98.2. pt denies any complaints of body aches or chills. Pt also denies any complaints of wheezing.  Pt states he does exercise and will ride a bicycle a bicycle once a day in the afternoon for about 43minutes. Pt states after he gets done riding the bike, he feels good other than being a  little tired and states his SOB is better after doing that as well.  Pt is wanting recommendations to help with his symptoms. Beth, please advise on this for pt. Thanks!

## 2018-11-03 ENCOUNTER — Telehealth: Payer: Self-pay | Admitting: *Deleted

## 2018-11-03 DIAGNOSIS — R918 Other nonspecific abnormal finding of lung field: Secondary | ICD-10-CM

## 2018-11-03 DIAGNOSIS — R059 Cough, unspecified: Secondary | ICD-10-CM

## 2018-11-03 DIAGNOSIS — R05 Cough: Secondary | ICD-10-CM

## 2018-11-03 NOTE — Telephone Encounter (Signed)
Spoke with the pt and notified of recs per MW  He wants to have the sinus ct and HRCT set up as he is still having symptoms  Orders sent to [facility]

## 2018-11-03 NOTE — Telephone Encounter (Signed)
-----   Message from Tanda Rockers, MD sent at 10/20/2018 12:13 PM EDT ----- If still coughing needs Sinus ct and hrct chest

## 2018-11-04 ENCOUNTER — Other Ambulatory Visit: Payer: Self-pay | Admitting: Family Medicine

## 2018-11-04 DIAGNOSIS — M7130 Other bursal cyst, unspecified site: Secondary | ICD-10-CM | POA: Diagnosis not present

## 2018-11-04 DIAGNOSIS — D485 Neoplasm of uncertain behavior of skin: Secondary | ICD-10-CM | POA: Diagnosis not present

## 2018-11-04 DIAGNOSIS — L400 Psoriasis vulgaris: Secondary | ICD-10-CM | POA: Diagnosis not present

## 2018-11-04 DIAGNOSIS — L570 Actinic keratosis: Secondary | ICD-10-CM | POA: Diagnosis not present

## 2018-11-04 DIAGNOSIS — C4359 Malignant melanoma of other part of trunk: Secondary | ICD-10-CM | POA: Diagnosis not present

## 2018-11-04 DIAGNOSIS — L812 Freckles: Secondary | ICD-10-CM | POA: Diagnosis not present

## 2018-11-04 DIAGNOSIS — L821 Other seborrheic keratosis: Secondary | ICD-10-CM | POA: Diagnosis not present

## 2018-11-05 ENCOUNTER — Telehealth: Payer: Self-pay | Admitting: *Deleted

## 2018-11-05 NOTE — Telephone Encounter (Signed)

## 2018-11-07 ENCOUNTER — Ambulatory Visit (INDEPENDENT_AMBULATORY_CARE_PROVIDER_SITE_OTHER)
Admission: RE | Admit: 2018-11-07 | Discharge: 2018-11-07 | Disposition: A | Discharge status: Home / Self Care | Payer: Medicare Other | Source: Ambulatory Visit | Attending: Internal Medicine | Admitting: Internal Medicine

## 2018-11-07 ENCOUNTER — Other Ambulatory Visit: Payer: Self-pay

## 2018-11-07 ENCOUNTER — Other Ambulatory Visit: Payer: Self-pay | Admitting: Family Medicine

## 2018-11-07 DIAGNOSIS — R059 Cough, unspecified: Secondary | ICD-10-CM

## 2018-11-07 DIAGNOSIS — R918 Other nonspecific abnormal finding of lung field: Secondary | ICD-10-CM | POA: Diagnosis not present

## 2018-11-07 DIAGNOSIS — R05 Cough: Secondary | ICD-10-CM

## 2018-11-07 DIAGNOSIS — R0602 Shortness of breath: Secondary | ICD-10-CM | POA: Diagnosis not present

## 2018-11-10 NOTE — Progress Notes (Signed)
Spoke with pt and notified of results per Dr. Wert. Pt verbalized understanding and denied any questions. 

## 2018-11-13 DIAGNOSIS — M48061 Spinal stenosis, lumbar region without neurogenic claudication: Secondary | ICD-10-CM | POA: Diagnosis not present

## 2018-11-17 DIAGNOSIS — D485 Neoplasm of uncertain behavior of skin: Secondary | ICD-10-CM | POA: Diagnosis not present

## 2018-11-19 DIAGNOSIS — C4359 Malignant melanoma of other part of trunk: Secondary | ICD-10-CM | POA: Diagnosis not present

## 2018-11-23 ENCOUNTER — Other Ambulatory Visit: Payer: Self-pay | Admitting: Primary Care

## 2018-11-26 DIAGNOSIS — C4359 Malignant melanoma of other part of trunk: Secondary | ICD-10-CM | POA: Diagnosis not present

## 2018-11-29 DIAGNOSIS — Z1159 Encounter for screening for other viral diseases: Secondary | ICD-10-CM | POA: Diagnosis not present

## 2018-12-01 DIAGNOSIS — I10 Essential (primary) hypertension: Secondary | ICD-10-CM | POA: Diagnosis not present

## 2018-12-01 DIAGNOSIS — L409 Psoriasis, unspecified: Secondary | ICD-10-CM | POA: Diagnosis not present

## 2018-12-01 DIAGNOSIS — C4359 Malignant melanoma of other part of trunk: Secondary | ICD-10-CM | POA: Diagnosis not present

## 2018-12-01 DIAGNOSIS — C439 Malignant melanoma of skin, unspecified: Secondary | ICD-10-CM | POA: Diagnosis not present

## 2018-12-09 ENCOUNTER — Encounter: Payer: Self-pay | Admitting: Family Medicine

## 2018-12-09 DIAGNOSIS — Z09 Encounter for follow-up examination after completed treatment for conditions other than malignant neoplasm: Secondary | ICD-10-CM | POA: Diagnosis not present

## 2018-12-09 DIAGNOSIS — C439 Malignant melanoma of skin, unspecified: Secondary | ICD-10-CM | POA: Insufficient documentation

## 2018-12-09 DIAGNOSIS — C4359 Malignant melanoma of other part of trunk: Secondary | ICD-10-CM | POA: Diagnosis not present

## 2018-12-10 ENCOUNTER — Ambulatory Visit: Payer: Medicare Other | Admitting: Internal Medicine

## 2018-12-11 ENCOUNTER — Ambulatory Visit (INDEPENDENT_AMBULATORY_CARE_PROVIDER_SITE_OTHER): Payer: Medicare Other | Admitting: Internal Medicine

## 2018-12-11 ENCOUNTER — Other Ambulatory Visit: Payer: Self-pay

## 2018-12-11 ENCOUNTER — Encounter: Payer: Self-pay | Admitting: Internal Medicine

## 2018-12-11 DIAGNOSIS — R918 Other nonspecific abnormal finding of lung field: Secondary | ICD-10-CM | POA: Diagnosis not present

## 2018-12-11 NOTE — Patient Instructions (Signed)
Pulmonary follow up is as needed for unexplained breathing problems or cough .

## 2018-12-11 NOTE — Progress Notes (Signed)
Chad Serrano, male    DOB: Sep 18, 1945,    MRN: [id number]   Brief patient profile:  55 yowm never smoker regularly works out 5 days / week 2 h/day then Jan 2020 indolent onset cough esp when exercise but not sleeping then sob then mid April 2020 noted also doe and CTa 09/16/2018  c/w RML density / TNTC pulmonary nodules so referred to pulmonary clinic 10/02/2018 by Dr   Dennard Schaumann     History of Present Illness  10/02/2018  Pulmonary/ 1st office eval/Julias Mould  Chief Complaint  Patient presents with  . Pulmonary Consult    Referred by Dr. Dennard Schaumann- recent PNA and SOB.   Dyspnea:  Improving p finished abx now able to do mb and back s sob where wasn't before Cough: about the same- non productive Sleep: fine flat  SABA use: none  Chills x 3-4 days / no sweats  rec No change rx    10/20/2018  f/u ov/Alvilda Mckenna re: cough x Jan 2020 but no more chills and cough better during the day than it was Chief Complaint  Patient presents with  . Follow-up    Patient states that his breathing is about the same since last visit, no worse, no better. Dry couch has gotten better.  Dyspnea:  mb and back fine  Cough: mostly p breakfast assoc with new pnds has not tried [employer]: flat one pillow  SABA use: none  Using IS and "goes to top"  s provoking cough  Uses tramadol for back problems not for cough  rec Try zyrtec 10 mg at bedtime to see if helps with the nasal drainage issues in the morning  To get the most out of exercise, We will call to schedule you a HRCT in about mid June to compare to previous studies - if exercise tolerance and cough are getting back to normal ok to call to cancel   12/11/2018  f/u ov/Manuelita Moxon re: mpns/ dx with Level V melanoma since last ov with surgery at [facility], no nodes/ neg PET  Chief Complaint  Patient presents with  . Follow-up    review ct chest  Dyspnea:  Not very active yet / plans to get back on bike soon   Cough: some throat clearing still, non productive   Sleeping:  well s resp symptoms  SABA use: no inhalers 02: none    No obvious day to day or daytime variability or assoc excess/ purulent sputum or mucus plugs or hemoptysis or cp or chest tightness, subjective wheeze or overt sinus or hb symptoms.   Sleeping  without nocturnal  or early am exacerbation  of respiratory  c/o's or need for noct saba. Also denies any obvious fluctuation of symptoms with weather or environmental changes or other aggravating or alleviating factors except as outlined above   No unusual exposure hx or h/o childhood pna/ asthma or knowledge of premature birth.  Current Allergies, Complete Past Medical History, Past Surgical History, Family History, and Social History were reviewed in [employer] record.  ROS  The following are not active complaints unless bolded Hoarseness, sore throat, dysphagia, dental problems, itching, sneezing,  nasal congestion or discharge of excess mucus or purulent secretions, ear ache,   fever, chills, sweats, unintended wt loss from surgery/ stabilized post op or wt gain, classically pleuritic or exertional cp,  orthopnea pnd or arm/hand swelling  or leg swelling, presyncope, palpitations, abdominal pain, anorexia, nausea, vomiting, diarrhea  or change in bowel habits or  change in bladder habits, change in stools or change in urine, dysuria, hematuria,  rash, arthralgias, visual complaints, headache, numbness, weakness or ataxia or problems with walking or coordination,  change in mood or  memory.        Current Meds  Medication Sig  . allopurinol (ZYLOPRIM) 100 MG tablet TAKE 2 TABLETS BY MOUTH EVERY DAY  . aspirin 81 MG tablet Take 81 mg by mouth daily.  . benzonatate (TESSALON) 200 MG capsule Take 1 capsule (200 mg total) by mouth 3 (three) times daily as needed for cough.  . Cholecalciferol (VITAMIN D3) 2000 UNITS TABS Take by mouth.  Marland Kitchen L-Arginine 500 MG TABS Take by mouth.  . losartan (COZAAR) 100 MG tablet TAKE 1 TABLET  BY MOUTH EVERY DAY  . Magnesium 500 MG CAPS Take by mouth.  . Multiple Vitamins-Minerals (CENTRUM SILVER ADULT 50+) TABS Take by mouth.  . Omega-3 Fatty Acids (FISH OIL) 1000 MG CAPS Take by mouth.  . traMADol (ULTRAM) 50 MG tablet Take 50 mg by mouth every 12 (twelve) hours as needed for moderate pain.  . vitamin B-12 (CYANOCOBALAMIN) 1000 MCG tablet Take 3,000 mcg by mouth daily.  . Zinc 50 MG TABS Take by mouth.                   Objective:    amb pleasant wm nad    Vital signs reviewed - Note on arrival 02 sats  98% on RA     12/11/2018       215  .10/20/18 235 lb 12.8 oz (107 kg)  10/02/18 237 lb (107.5 kg)  09/23/18 239 lb (108.4 kg)      HEENT: nl dentition, turbinates bilaterally, and oropharynx. Nl external ear canals without cough reflex   NECK :  without JVD/Nodes/TM/ nl carotid upstrokes bilaterally   LUNGS: no acc muscle use,  Nl contour chest which is clear to A and P bilaterally without cough on insp or exp maneuvers   CV:  RRR  no s3 or murmur or increase in P2, and no edema   ABD:  soft and nontender with nl inspiratory excursion in the supine position. No bruits or organomegaly appreciated, bowel sounds nl  MS:  Nl gait/ ext warm without deformities, calf tenderness, cyanosis or clubbing No obvious joint restrictions   SKIN: warm and dry without lesions    NEURO:  alert, approp, nl sensorium with  no motor or cerebellar deficits apparent.       I personally reviewed images and agree with radiology impression as follows:   Chest CT PET 11/26/18 Nl   HRCT 11/07/18 Curvilinear parenchymal band in right middle lobe has significantly decreased in thickness in the interval, compatible with evolution of postinfectious/postinflammatory scarring. Persistent mild patchy ground-glass centrilobular micronodularity throughout both lungs, substantially decreased in the interval        Assessment

## 2018-12-12 ENCOUNTER — Encounter: Payer: Self-pay | Admitting: Internal Medicine

## 2018-12-12 NOTE — Assessment & Plan Note (Signed)
Onset of cough Jan 2020 then assoc doe mid April 2020  -CT 09/16/2018  c/w RML density / TNTC pulmonary nodules> rx augmentin and clinically improved  - cxr 10/02/2018 wnl with esr only 13  ANCA  Serology 10/02/2018 neg Covid IgG ab 10/02/2018  = neg  Quant TB 10/02/2018 neg  ACE 10/02/2018  32 Labs ordered 10/02/2018  HSP cypto serologies  Neg - blasto serology 10/02/2018 >>> neg  HRCT chest  11/07/18 c/w marked improvement > f/u in a year rec  Sinus ct 11/07/18 neg    Luckily it appears that the melanoma had nothing to do with the lung nodules and he has had a good outcome in both regards, so pulmonary f/u can be as needed.  

## 2018-12-19 ENCOUNTER — Other Ambulatory Visit: Payer: Self-pay | Admitting: Internal Medicine

## 2018-12-31 ENCOUNTER — Encounter: Payer: Self-pay | Admitting: Family Medicine

## 2019-01-06 DIAGNOSIS — C4359 Malignant melanoma of other part of trunk: Secondary | ICD-10-CM | POA: Diagnosis not present

## 2019-01-06 DIAGNOSIS — Z09 Encounter for follow-up examination after completed treatment for conditions other than malignant neoplasm: Secondary | ICD-10-CM | POA: Diagnosis not present

## 2019-01-22 DIAGNOSIS — H524 Presbyopia: Secondary | ICD-10-CM | POA: Diagnosis not present

## 2019-01-22 DIAGNOSIS — H5203 Hypermetropia, bilateral: Secondary | ICD-10-CM | POA: Diagnosis not present

## 2019-01-22 DIAGNOSIS — H52222 Regular astigmatism, left eye: Secondary | ICD-10-CM | POA: Diagnosis not present

## 2019-01-22 DIAGNOSIS — H2513 Age-related nuclear cataract, bilateral: Secondary | ICD-10-CM | POA: Diagnosis not present

## 2019-02-16 DIAGNOSIS — M48061 Spinal stenosis, lumbar region without neurogenic claudication: Secondary | ICD-10-CM | POA: Diagnosis not present

## 2019-02-17 ENCOUNTER — Ambulatory Visit (INDEPENDENT_AMBULATORY_CARE_PROVIDER_SITE_OTHER): Payer: Medicare Other

## 2019-02-17 ENCOUNTER — Other Ambulatory Visit: Payer: Self-pay

## 2019-02-17 DIAGNOSIS — Z23 Encounter for immunization: Secondary | ICD-10-CM

## 2019-03-04 DIAGNOSIS — D485 Neoplasm of uncertain behavior of skin: Secondary | ICD-10-CM | POA: Diagnosis not present

## 2019-03-04 DIAGNOSIS — D225 Melanocytic nevi of trunk: Secondary | ICD-10-CM | POA: Diagnosis not present

## 2019-03-04 DIAGNOSIS — L821 Other seborrheic keratosis: Secondary | ICD-10-CM | POA: Diagnosis not present

## 2019-03-04 DIAGNOSIS — L820 Inflamed seborrheic keratosis: Secondary | ICD-10-CM | POA: Diagnosis not present

## 2019-03-04 DIAGNOSIS — Z85820 Personal history of malignant melanoma of skin: Secondary | ICD-10-CM | POA: Diagnosis not present

## 2019-03-04 DIAGNOSIS — L812 Freckles: Secondary | ICD-10-CM | POA: Diagnosis not present

## 2019-03-04 DIAGNOSIS — D1801 Hemangioma of skin and subcutaneous tissue: Secondary | ICD-10-CM | POA: Diagnosis not present

## 2019-04-13 ENCOUNTER — Other Ambulatory Visit: Payer: Self-pay

## 2019-05-18 DIAGNOSIS — M48061 Spinal stenosis, lumbar region without neurogenic claudication: Secondary | ICD-10-CM | POA: Diagnosis not present

## 2019-05-22 DIAGNOSIS — H3401 Transient retinal artery occlusion, right eye: Secondary | ICD-10-CM | POA: Diagnosis not present

## 2019-05-22 DIAGNOSIS — H3561 Retinal hemorrhage, right eye: Secondary | ICD-10-CM | POA: Diagnosis not present

## 2019-05-25 ENCOUNTER — Observation Stay (HOSPITAL_COMMUNITY)
Admission: EM | Admit: 2019-05-25 | Discharge: 2019-05-26 | Disposition: A | Discharge status: Home / Self Care | Payer: Medicare Other | Attending: Internal Medicine | Admitting: Internal Medicine

## 2019-05-25 ENCOUNTER — Emergency Department (HOSPITAL_COMMUNITY): Payer: Medicare Other

## 2019-05-25 ENCOUNTER — Other Ambulatory Visit: Payer: Self-pay

## 2019-05-25 ENCOUNTER — Observation Stay (HOSPITAL_COMMUNITY): Payer: Medicare Other

## 2019-05-25 ENCOUNTER — Encounter (HOSPITAL_COMMUNITY): Payer: Self-pay | Admitting: Internal Medicine

## 2019-05-25 DIAGNOSIS — I700 Atherosclerosis of aorta: Secondary | ICD-10-CM | POA: Insufficient documentation

## 2019-05-25 DIAGNOSIS — H349 Unspecified retinal vascular occlusion: Secondary | ICD-10-CM | POA: Diagnosis present

## 2019-05-25 DIAGNOSIS — Z7982 Long term (current) use of aspirin: Secondary | ICD-10-CM | POA: Diagnosis not present

## 2019-05-25 DIAGNOSIS — E785 Hyperlipidemia, unspecified: Secondary | ICD-10-CM | POA: Diagnosis not present

## 2019-05-25 DIAGNOSIS — R29898 Other symptoms and signs involving the musculoskeletal system: Secondary | ICD-10-CM | POA: Insufficient documentation

## 2019-05-25 DIAGNOSIS — I129 Hypertensive chronic kidney disease with stage 1 through stage 4 chronic kidney disease, or unspecified chronic kidney disease: Secondary | ICD-10-CM | POA: Insufficient documentation

## 2019-05-25 DIAGNOSIS — H5461 Unqualified visual loss, right eye, normal vision left eye: Secondary | ICD-10-CM | POA: Insufficient documentation

## 2019-05-25 DIAGNOSIS — H547 Unspecified visual loss: Secondary | ICD-10-CM

## 2019-05-25 DIAGNOSIS — M109 Gout, unspecified: Secondary | ICD-10-CM | POA: Diagnosis present

## 2019-05-25 DIAGNOSIS — I639 Cerebral infarction, unspecified: Principal | ICD-10-CM | POA: Diagnosis present

## 2019-05-25 DIAGNOSIS — I10 Essential (primary) hypertension: Secondary | ICD-10-CM | POA: Diagnosis not present

## 2019-05-25 DIAGNOSIS — Z85820 Personal history of malignant melanoma of skin: Secondary | ICD-10-CM | POA: Diagnosis not present

## 2019-05-25 DIAGNOSIS — H3411 Central retinal artery occlusion, right eye: Secondary | ICD-10-CM | POA: Diagnosis not present

## 2019-05-25 DIAGNOSIS — I6523 Occlusion and stenosis of bilateral carotid arteries: Secondary | ICD-10-CM | POA: Diagnosis not present

## 2019-05-25 DIAGNOSIS — N189 Chronic kidney disease, unspecified: Secondary | ICD-10-CM | POA: Diagnosis not present

## 2019-05-25 DIAGNOSIS — R918 Other nonspecific abnormal finding of lung field: Secondary | ICD-10-CM

## 2019-05-25 DIAGNOSIS — Z20822 Contact with and (suspected) exposure to covid-19: Secondary | ICD-10-CM | POA: Diagnosis not present

## 2019-05-25 DIAGNOSIS — H3581 Retinal edema: Secondary | ICD-10-CM | POA: Diagnosis not present

## 2019-05-25 DIAGNOSIS — H43822 Vitreomacular adhesion, left eye: Secondary | ICD-10-CM | POA: Diagnosis not present

## 2019-05-25 DIAGNOSIS — Z79899 Other long term (current) drug therapy: Secondary | ICD-10-CM | POA: Insufficient documentation

## 2019-05-25 DIAGNOSIS — H35033 Hypertensive retinopathy, bilateral: Secondary | ICD-10-CM | POA: Diagnosis not present

## 2019-05-25 LAB — COMPREHENSIVE METABOLIC PANEL
ALT: 31 U/L (ref 0–44)
AST: 26 U/L (ref 15–41)
Albumin: 3.7 g/dL (ref 3.5–5.0)
Alkaline Phosphatase: 66 U/L (ref 38–126)
Anion gap: 9 (ref 5–15)
BUN: 18 mg/dL (ref 8–23)
CO2: 26 mmol/L (ref 22–32)
Calcium: 9.2 mg/dL (ref 8.9–10.3)
Chloride: 104 mmol/L (ref 98–111)
Creatinine, Ser: 1.26 mg/dL — ABNORMAL HIGH (ref 0.61–1.24)
GFR calc Af Amer: >60 mL/min (ref >60)
GFR calc non Af Amer: 56 mL/min — ABNORMAL LOW (ref >60)
Glucose, Bld: 98 mg/dL (ref 70–99)
Potassium: 4.2 mmol/L (ref 3.5–5.1)
Sodium: 139 mmol/L (ref 135–145)
Total Bilirubin: 1.3 mg/dL — ABNORMAL HIGH (ref 0.3–1.2)
Total Protein: 6.7 g/dL (ref 6.5–8.1)

## 2019-05-25 LAB — DIFFERENTIAL
Abs Immature Granulocytes: 0.07 10*3/uL (ref 0.00–0.07)
Basophils Absolute: 0 10*3/uL (ref 0.0–0.1)
Basophils Relative: 0 %
Eosinophils Absolute: 0 10*3/uL (ref 0.0–0.5)
Eosinophils Relative: 0 %
Immature Granulocytes: 1 %
Lymphocytes Relative: 36 %
Lymphs Abs: 3.9 10*3/uL (ref 0.7–4.0)
Monocytes Absolute: 1.1 10*3/uL — ABNORMAL HIGH (ref 0.1–1.0)
Monocytes Relative: 11 %
Neutro Abs: 5.6 10*3/uL (ref 1.7–7.7)
Neutrophils Relative %: 52 %

## 2019-05-25 LAB — PROTIME-INR
INR: 1 (ref 0.8–1.2)
Prothrombin Time: 13.3 seconds (ref 11.4–15.2)

## 2019-05-25 LAB — CBC
HCT: 47.2 % (ref 39.0–52.0)
Hemoglobin: 15.8 g/dL (ref 13.0–17.0)
MCH: 32.9 pg (ref 26.0–34.0)
MCHC: 33.5 g/dL (ref 30.0–36.0)
MCV: 98.3 fL (ref 80.0–100.0)
Platelets: 205 10*3/uL (ref 150–400)
RBC: 4.8 MIL/uL (ref 4.22–5.81)
RDW: 12.8 % (ref 11.5–15.5)
WBC: 10.7 10*3/uL — ABNORMAL HIGH (ref 4.0–10.5)
nRBC: 0 % (ref 0.0–0.2)

## 2019-05-25 LAB — APTT: aPTT: 26 seconds (ref 24–36)

## 2019-05-25 MED ORDER — ACETAMINOPHEN 650 MG RE SUPP: 650.0000 mg | RECTAL | Status: DC | PRN

## 2019-05-25 MED ORDER — ASPIRIN 325 MG PO TABS
325.0000 mg | ORAL_TABLET | Freq: Every day | ORAL | Status: DC
  Administered 2019-05-26: 325 mg via ORAL
  Filled 2019-05-25: qty 1

## 2019-05-25 MED ORDER — ACETAMINOPHEN 160 MG/5ML PO SOLN: 650.0000 mg | ORAL | Status: DC | PRN

## 2019-05-25 MED ORDER — STROKE: EARLY STAGES OF RECOVERY BOOK: Freq: Once | Status: DC

## 2019-05-25 MED ORDER — ACETAMINOPHEN 325 MG PO TABS: 650.0000 mg | ORAL_TABLET | ORAL | Status: DC | PRN

## 2019-05-25 MED ORDER — HYDRALAZINE HCL 20 MG/ML IJ SOLN: 10.0000 mg | Freq: Four times a day (QID) | INTRAMUSCULAR | Status: DC | PRN

## 2019-05-25 MED ORDER — IOHEXOL 350 MG/ML SOLN: 75.0000 mL | Freq: Once | INTRAVENOUS | Status: DC | PRN

## 2019-05-25 MED ORDER — SODIUM CHLORIDE 0.9% FLUSH
3.0000 mL | Freq: Once | INTRAVENOUS | Status: DC
Start: 2019-05-25 — End: 2019-05-26

## 2019-05-25 MED ORDER — ENOXAPARIN SODIUM 40 MG/0.4ML ~~LOC~~ SOLN
40.0000 mg | SUBCUTANEOUS | Status: DC
  Administered 2019-05-26: 40 mg via SUBCUTANEOUS
  Filled 2019-05-25: qty 0.4

## 2019-05-25 MED ORDER — SODIUM CHLORIDE 0.9 % IV SOLN: INTRAVENOUS | Status: AC

## 2019-05-25 MED ORDER — IOHEXOL 350 MG/ML SOLN
100.0000 mL | Freq: Once | INTRAVENOUS | Status: AC | PRN
  Administered 2019-05-25: 100 mL via INTRAVENOUS

## 2019-05-25 MED ORDER — ASPIRIN 300 MG RE SUPP: 300.0000 mg | Freq: Every day | RECTAL | Status: DC

## 2019-05-25 MED ORDER — LORAZEPAM 2 MG/ML IJ SOLN
1.0000 mg | INTRAMUSCULAR | Status: DC | PRN
  Administered 2019-05-25: 1 mg via INTRAVENOUS
  Filled 2019-05-25: qty 1

## 2019-05-25 NOTE — Consult Note (Signed)
Neurology Consultation Reason for Consult: Central retinal artery occlusion Referring Physician: Georges Mouse  CC: Vision loss in his right eye  History is obtained from: Patient  HPI: Chad Serrano is a 74 y.o. male who suffered abrupt vision loss in his right eye on Thursday.  He saw an ophthalmologist today who saw "blocked arteries" and referred him to [facility] for further work-up for embolic stroke.  He states that the vision has not changed much since it started, he has a little bit of light perception, but is very limited in that eye.   LKW: Last Thursday tpa given?: no, outside of window    ROS: A 14 point ROS was performed and is negative except as noted in the HPI.   Past Medical History:  Diagnosis Date  . Anxiety   . Chronic kidney disease   . Gout   . Hyperlipidemia   . Hypertension   . Hypogonadism male   . Hypogonadism male   . Melanoma ([place])    at T4 surgically excised at [facility] (2020) free margins and sentinel node biopsy negative     Family History  Problem Relation Age of Onset  . Colon polyps Father   . Colon cancer Neg Hx   . Esophageal cancer Neg Hx   . Rectal cancer Neg Hx   . Stomach cancer Neg Hx      Social History:  reports that he has never smoked. He has never used smokeless tobacco. He reports that he does not drink alcohol or use drugs.   Exam: Current vital signs: BP (!) 150/88 (BP Location: Left Arm)   Pulse 70   Temp 98.5 F (36.9 C) (Oral)   Resp 18   Ht 5\' 11"  (1.803 m)   Wt 99.8 kg   SpO2 97%   BMI 30.68 kg/m  Vital signs in last 24 hours: Temp:  [98.2 F (36.8 C)-98.5 F (36.9 C)] 98.5 F (36.9 C) (01/04 1541) Pulse Rate:  [70-80] 70 (01/04 2314) Resp:  [16-18] 18 (01/04 2314) BP: (144-159)/(84-89) 150/88 (01/04 2314) SpO2:  [97 %-100 %] 97 % (01/04 2314) Weight:  [99.8 kg] 99.8 kg (01/04 1047)   Physical Exam  Constitutional: Appears well-developed and well-nourished.  Psych: Affect appropriate to  situation Eyes: No scleral injection HENT: No OP obstrucion MSK: no joint deformities.  Cardiovascular: Normal rate and regular rhythm.  Respiratory: Effort normal, non-labored breathing GI: Soft.  No distension. There is no tenderness.  Skin: WDI  Neuro: Mental Status: Patient is awake, alert, oriented to person, place, month, year, and situation. Patient is able to give a clear and coherent history. No signs of aphasia or neglect Cranial Nerves: II: Visual Fields are full. Pupils are reactive bilaterally, but there is an afferent pupillary defect on the right III,IV, VI: EOMI without ptosis or diploplia.  V: Facial sensation is symmetric to temperature VII: Facial movement is symmetric.  VIII: hearing is intact to voice X: Uvula elevates symmetrically XI: Shoulder shrug is symmetric. XII: tongue is midline without atrophy or fasciculations.  Motor: Tone is normal. Bulk is normal. 5/5 strength was present in all four extremities.  Sensory: Sensation is symmetric to light touch and temperature in the arms and legs. Cerebellar: FNF and HKS are intact bilaterally    I have reviewed labs in epic and the results pertinent to this consultation are: Borderline elevation in creatinine CBC-mild leukocytosis  I have reviewed the images obtained: CT head - negative.    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  He is being admitted for evaluation of embolic phenomena.  Recommendations: - HgbA1c, fasting lipid panel - MRI  of the brain without contrast - Frequent neuro checks - Echocardiogram -CTA head neck - Prophylactic therapy-Antiplatelet med: Aspirin - dose 325mg  PO or 300mg  PR - Risk factor modification - Telemetry monitoring - PT consult, OT consult, Speech consult - Stroke team to follow    Roland Rack, MD [facility] [phone]  If 7pm- 7am, please page neurology on call as listed in [place].

## 2019-05-25 NOTE — ED Triage Notes (Addendum)
Pt being sent from [facility] retinal specialist with dx of Arterial Occlusion in the right out, here to r/o Stroke. Pt reports loss of vision in the right eye since Thursday. Denies pain. A/O X4. EDP notified.

## 2019-05-25 NOTE — H&P (Signed)
TRH H&P    Patient Demographics:    Chad Serrano, is a 74 y.o. male  MRN: [id number]  DOB - July 17, 1945  Admit Date - 05/25/2019  Referring MD/NP/PA:  Carmon Sails  Outpatient Primary MD for the patient is Susy Frizzle, MD  Patient coming from:  home  Chief complaint-  Right eye vision loss 3-4 days   HPI:    Chad Serrano  is a 74 y.o. male,  w gout, hypogonadism, h/o melanoma, hypertension, hyperlipidemia, presents with right eye vision loss for the past 3-4 days. Pt was seen at [facility] with dx of right retinal artery occlusion.   In ED,  T 98.2, P 80 R 18, Bp 159/89  Pox 98% RA Wt 99.8kg  MRI brain IMPRESSION: 1. No acute intracranial abnormality. 2. Generalized atrophy without lobar predilection.  CTA neck/ head pending  INR 1.0 PTT 26 Wbc 10.7, Hgb 15.8, Plt 205 Na 139, K 4.2, Bun 18, Creatinien 1.26 Ast 26, Alt 31  Pt will be admitted for evaluation of right retinal artery occlusion.      Review of systems:    In addition to the HPI above,  No Fever-chills, No Headache,  No problems swallowing food or Liquids, No Chest pain, Cough or Shortness of Breath, No Abdominal pain, No Nausea or Vomiting, bowel movements are regular, No Blood in stool or Urine, No dysuria, No new skin rashes or bruises, No new joints pains-aches,  No new weakness, tingling, numbness in any extremity, No recent weight gain or loss, No polyuria, polydypsia or polyphagia, No significant Mental Stressors.  All other systems reviewed and are negative.    Past History of the following :    Past Medical History:  Diagnosis Date  . Anxiety   . Chronic kidney disease   . Gout   . Hyperlipidemia   . Hypertension   . Hypogonadism male   . Hypogonadism male   . Melanoma ([place])    at T4 surgically excised at [facility] (2020) free margins and sentinel node biopsy negative     Past Surgical History:  Procedure Laterality Date  . APPENDECTOMY    . COLONOSCOPY    . ROTATOR CUFF REPAIR Right       Social History:      Social History   Tobacco Use  . Smoking status: Never Smoker  . Smokeless tobacco: Never Used  Substance Use Topics  . Alcohol use: No       Family History :     Family History  Problem Relation Age of Onset  . Colon polyps Father   . Colon cancer Neg Hx   . Esophageal cancer Neg Hx   . Rectal cancer Neg Hx   . Stomach cancer Neg Hx        Home Medications:   Prior to Admission medications   Medication Sig Start Date End Date Taking? Authorizing Provider  allopurinol (ZYLOPRIM) 100 MG tablet TAKE 2 TABLETS BY MOUTH EVERY DAY 11/07/18   Susy Frizzle, MD  aspirin 81  MG tablet Take 81 mg by mouth daily.    [provider]  azelastine (ASTELIN) 0.1 % nasal spray SPRAY 2 SPRAYS INTO EACH NOSTRIL TWICE A DAY AS DIRECTED 12/19/18   Tanda Rockers, MD  benzonatate (TESSALON) 200 MG capsule Take 1 capsule (200 mg total) by mouth 3 (three) times daily as needed for cough. 10/31/18   Martyn Ehrich, NP  Cholecalciferol (VITAMIN D3) 2000 UNITS TABS Take by mouth.    [provider]  L-Arginine 500 MG TABS Take by mouth.    [provider]  losartan (COZAAR) 100 MG tablet TAKE 1 TABLET BY MOUTH EVERY DAY 11/04/18   Susy Frizzle, MD  Magnesium 500 MG CAPS Take by mouth.    [provider]  Multiple Vitamins-Minerals (CENTRUM SILVER ADULT 50+) TABS Take by mouth.    [provider]  Omega-3 Fatty Acids (FISH OIL) 1000 MG CAPS Take by mouth.    [provider]  traMADol (ULTRAM) 50 MG tablet Take 50 mg by mouth every 12 (twelve) hours as needed for moderate pain.    [provider]  vitamin B-12 (CYANOCOBALAMIN) 1000 MCG tablet Take 3,000 mcg by mouth daily.    [provider]  Zinc 50 MG TABS Take by mouth.    [provider]     Allergies:     No Known Allergies   Physical Exam:   Vitals  Blood pressure (!) 154/84, pulse 72, temperature 98.5 F (36.9 C), temperature source Oral, resp. rate 18, height 5\' 11"  (1.803 m), weight 99.8 kg, SpO2 100 %.  1.  General: axoxo3  2. Psychiatric: euthymic  3. Neurologic: Vision loss right eye, reflexes 2+ symmetric, diffuse with no clonus, motor 5/5 in all 4 ext  4. HEENMT:  Anicteric, pupils 1.28mm symmetric, diffuse with no clonus, motor 5/5 in all 4 ext  5. Respiratory : CTAB  6. Cardiovascular : rrr s1, s2, no m/g/r  7. Gastrointestinal:  Abd: soft, nt, nd, +bs  8. Skin:  Ext: no c/c/e, no rash  9.Musculoskeletal:  Good ROM    Data Review:    CBC Recent Labs  Lab 05/25/19 1049  WBC 10.7*  HGB 15.8  HCT 47.2  PLT 205  MCV 98.3  MCH 32.9  MCHC 33.5  RDW 12.8  LYMPHSABS 3.9  MONOABS 1.1*  EOSABS 0.0  BASOSABS 0.0   ------------------------------------------------------------------------------------------------------------------  Results for orders placed or performed during the hospital encounter of 05/25/19 (from the past 48 hour(s))  Protime-INR     Status: None   Collection Time: 05/25/19 10:49 AM  Result Value Ref Range   Prothrombin Time 13.3 11.4 - 15.2 seconds   INR 1.0 0.8 - 1.2    Comment: (NOTE) INR goal varies based on device and disease states. Performed at [facility], [place] [address], [place], [place] [address]   APTT     Status: None   Collection Time: 05/25/19 10:49 AM  Result Value Ref Range   aPTT 26 24 - 36 seconds    Comment: Performed at [facility] [address] [place] [address]  CBC     Status: Abnormal   Collection Time: 05/25/19 10:49 AM  Result Value Ref Range   WBC 10.7 (H) 4.0 - 10.5 K/uL   RBC 4.80 4.22 - 5.81 MIL/uL   Hemoglobin 15.8 13.0 - 17.0 g/dL   HCT 47.2 39.0 - 52.0 %   MCV 98.3 80.0 - 100.0 fL  MCH 32.9 26.0 - 34.0 pg   MCHC 33.5 30.0 - 36.0 g/dL   RDW 12.8 11.5 - 15.5 %     Platelets 205 150 - 400 K/uL   nRBC 0.0 0.0 - 0.2 %    Comment: Performed at [facility], [place] [address], [place], [place] [address]  Differential     Status: Abnormal   Collection Time: 05/25/19 10:49 AM  Result Value Ref Range   Neutrophils Relative % 52 %   Neutro Abs 5.6 1.7 - 7.7 K/uL   Lymphocytes Relative 36 %   Lymphs Abs 3.9 0.7 - 4.0 K/uL   Monocytes Relative 11 %   Monocytes Absolute 1.1 (H) 0.1 - 1.0 K/uL   Eosinophils Relative 0 %   Eosinophils Absolute 0.0 0.0 - 0.5 K/uL   Basophils Relative 0 %   Basophils Absolute 0.0 0.0 - 0.1 K/uL   Immature Granulocytes 1 %   Abs Immature Granulocytes 0.07 0.00 - 0.07 K/uL    Comment: Performed at [facility] [address], [place], [place] [address]  Comprehensive metabolic panel     Status: Abnormal   Collection Time: 05/25/19 10:49 AM  Result Value Ref Range   Sodium 139 135 - 145 mmol/L   Potassium 4.2 3.5 - 5.1 mmol/L   Chloride 104 98 - 111 mmol/L   CO2 26 22 - 32 mmol/L   Glucose, Bld 98 70 - 99 mg/dL   BUN 18 8 - 23 mg/dL   Creatinine, Ser 1.26 (H) 0.61 - 1.24 mg/dL   Calcium 9.2 8.9 - 10.3 mg/dL   Total Protein 6.7 6.5 - 8.1 g/dL   Albumin 3.7 3.5 - 5.0 g/dL   AST 26 15 - 41 U/L   ALT 31 0 - 44 U/L   Alkaline Phosphatase 66 38 - 126 U/L   Total Bilirubin 1.3 (H) 0.3 - 1.2 mg/dL   GFR calc non Af Amer 56 (L) >60 mL/min   GFR calc Af Amer >60 >60 mL/min   Anion gap 9 5 - 15    Comment: Performed at [facility] [address], [place], [place] [address]    Chemistries  Recent Labs  Lab 05/25/19 1049  NA 139  K 4.2  CL 104  CO2 26  GLUCOSE 98  BUN 18  CREATININE 1.26*  CALCIUM 9.2  AST 26  ALT 31  ALKPHOS 66  BILITOT 1.3*   ------------------------------------------------------------------------------------------------------------------  ------------------------------------------------------------------------------------------------------------------ GFR: Estimated  Creatinine Clearance: 62.8 mL/min (A) (by C-G formula based on SCr of 1.26 mg/dL (H)). Liver Function Tests: Recent Labs  Lab 05/25/19 1049  AST 26  ALT 31  ALKPHOS 66  BILITOT 1.3*  PROT 6.7  ALBUMIN 3.7   No results for input(s): LIPASE, AMYLASE in the last 168 hours. No results for input(s): AMMONIA in the last 168 hours. Coagulation Profile: Recent Labs  Lab 05/25/19 1049  INR 1.0   Cardiac Enzymes: No results for input(s): CKTOTAL, CKMB, CKMBINDEX, TROPONINI in the last 168 hours. BNP (last 3 results) No results for input(s): PROBNP in the last 8760 hours. HbA1C: No results for input(s): HGBA1C in the last 72 hours. CBG: No results for input(s): GLUCAP in the last 168 hours. Lipid Profile: No results for input(s): CHOL, HDL, LDLCALC, TRIG, CHOLHDL, LDLDIRECT in the last 72 hours. Thyroid Function Tests: No results for input(s): TSH, T4TOTAL, FREET4, T3FREE, THYROIDAB in the last 72 hours. Anemia Panel: No results for input(s): VITAMINB12, FOLATE, FERRITIN,  TIBC, IRON, RETICCTPCT in the last 72 hours.  --------------------------------------------------------------------------------------------------------------- Urine analysis:    Component Value Date/Time   COLORURINE YELLOW 03/30/2016 1031   APPEARANCEUR CLEAR 03/30/2016 1031   LABSPEC 1.015 03/30/2016 1031   PHURINE 7.0 03/30/2016 1031   GLUCOSEU NEGATIVE 03/30/2016 1031   HGBUR NEGATIVE 03/30/2016 1031   BILIRUBINUR NEGATIVE 03/30/2016 1031   KETONESUR NEGATIVE 03/30/2016 1031   PROTEINUR NEGATIVE 03/30/2016 1031   NITRITE NEGATIVE 03/30/2016 1031   LEUKOCYTESUR NEGATIVE 03/30/2016 1031      Imaging Results:    CT Angio Head W or Wo Contrast  Result Date: 05/25/2019 CLINICAL DATA:  Right eye vision loss EXAM: CT ANGIOGRAPHY HEAD AND NECK TECHNIQUE: Multidetector CT imaging of the head and neck was performed using the standard protocol during bolus administration of intravenous contrast. Multiplanar CT  image reconstructions and MIPs were obtained to evaluate the vascular anatomy. Carotid stenosis measurements (when applicable) are obtained utilizing NASCET criteria, using the distal internal carotid diameter as the denominator. CONTRAST:  122mL OMNIPAQUE IOHEXOL 350 MG/ML SOLN COMPARISON:  Brain MRI same day FINDINGS: CT HEAD FINDINGS Brain: There is no mass, hemorrhage or extra-axial collection. There is generalized atrophy without lobar predilection. There is no acute or chronic infarction. There is hypoattenuation of the periventricular white matter, most commonly indicating chronic ischemic microangiopathy. Skull: The visualized skull base, calvarium and extracranial soft tissues are normal. Sinuses/Orbits: No fluid levels or advanced mucosal thickening of the visualized paranasal sinuses. No mastoid or middle ear effusion. The orbits are normal. CTA NECK FINDINGS SKELETON: There is no bony spinal canal stenosis. No lytic or blastic lesion. OTHER NECK: Normal pharynx, larynx and major salivary glands. No cervical lymphadenopathy. Unremarkable thyroid gland. UPPER CHEST: No pneumothorax or pleural effusion. No nodules or masses. AORTIC ARCH: There is mild calcific atherosclerosis of the aortic arch. There is no aneurysm, dissection or hemodynamically significant stenosis of the visualized portion of the aorta. Conventional 3 vessel aortic branching pattern. The visualized proximal subclavian arteries are widely patent. RIGHT CAROTID SYSTEM: No dissection, occlusion or aneurysm. Mild atherosclerotic calcification at the carotid bifurcation without hemodynamically significant stenosis. LEFT CAROTID SYSTEM: No dissection, occlusion or aneurysm. Mild atherosclerotic calcification at the carotid bifurcation without hemodynamically significant stenosis. VERTEBRAL ARTERIES: Left dominant configuration. Both origins are clearly patent. There is no dissection, occlusion or flow-limiting stenosis to the skull base (V1-V3  segments). CTA HEAD FINDINGS POSTERIOR CIRCULATION: --Vertebral arteries: Normal V4 segments. --Posterior inferior cerebellar arteries (PICA): Patent origins from the vertebral arteries. --Anterior inferior cerebellar arteries (AICA): Patent origins from the basilar artery. --Basilar artery: Normal. --Superior cerebellar arteries: Normal. --Posterior cerebral arteries: Normal. Both are predominantly supplied by the posterior communicating arteries (p-comm). ANTERIOR CIRCULATION: --Intracranial internal carotid arteries: Normal. --Anterior cerebral arteries (ACA): Normal. Both A1 segments are present. Patent anterior communicating artery (a-comm). --Middle cerebral arteries (MCA): Normal. VENOUS SINUSES: As permitted by contrast timing, patent. ANATOMIC VARIANTS: None Review of the MIP images confirms the above findings. IMPRESSION: 1. No intracranial arterial occlusion or high-grade stenosis. 2. Mild bilateral carotid bifurcation atherosclerosis and aortic atherosclerosis (ICD10-I70.0). Electronically Signed   By: Ulyses Jarred M.D.   On: 05/25/2019 22:51   CT Angio Neck W and/or Wo Contrast  Result Date: 05/25/2019 CLINICAL DATA:  Right eye vision loss EXAM: CT ANGIOGRAPHY HEAD AND NECK TECHNIQUE: Multidetector CT imaging of the head and neck was performed using the standard protocol during bolus administration of intravenous contrast. Multiplanar CT image reconstructions and MIPs were obtained to evaluate the vascular  anatomy. Carotid stenosis measurements (when applicable) are obtained utilizing NASCET criteria, using the distal internal carotid diameter as the denominator. CONTRAST:  155mL OMNIPAQUE IOHEXOL 350 MG/ML SOLN COMPARISON:  Brain MRI same day FINDINGS: CT HEAD FINDINGS Brain: There is no mass, hemorrhage or extra-axial collection. There is generalized atrophy without lobar predilection. There is no acute or chronic infarction. There is hypoattenuation of the periventricular white matter, most  commonly indicating chronic ischemic microangiopathy. Skull: The visualized skull base, calvarium and extracranial soft tissues are normal. Sinuses/Orbits: No fluid levels or advanced mucosal thickening of the visualized paranasal sinuses. No mastoid or middle ear effusion. The orbits are normal. CTA NECK FINDINGS SKELETON: There is no bony spinal canal stenosis. No lytic or blastic lesion. OTHER NECK: Normal pharynx, larynx and major salivary glands. No cervical lymphadenopathy. Unremarkable thyroid gland. UPPER CHEST: No pneumothorax or pleural effusion. No nodules or masses. AORTIC ARCH: There is mild calcific atherosclerosis of the aortic arch. There is no aneurysm, dissection or hemodynamically significant stenosis of the visualized portion of the aorta. Conventional 3 vessel aortic branching pattern. The visualized proximal subclavian arteries are widely patent. RIGHT CAROTID SYSTEM: No dissection, occlusion or aneurysm. Mild atherosclerotic calcification at the carotid bifurcation without hemodynamically significant stenosis. LEFT CAROTID SYSTEM: No dissection, occlusion or aneurysm. Mild atherosclerotic calcification at the carotid bifurcation without hemodynamically significant stenosis. VERTEBRAL ARTERIES: Left dominant configuration. Both origins are clearly patent. There is no dissection, occlusion or flow-limiting stenosis to the skull base (V1-V3 segments). CTA HEAD FINDINGS POSTERIOR CIRCULATION: --Vertebral arteries: Normal V4 segments. --Posterior inferior cerebellar arteries (PICA): Patent origins from the vertebral arteries. --Anterior inferior cerebellar arteries (AICA): Patent origins from the basilar artery. --Basilar artery: Normal. --Superior cerebellar arteries: Normal. --Posterior cerebral arteries: Normal. Both are predominantly supplied by the posterior communicating arteries (p-comm). ANTERIOR CIRCULATION: --Intracranial internal carotid arteries: Normal. --Anterior cerebral arteries  (ACA): Normal. Both A1 segments are present. Patent anterior communicating artery (a-comm). --Middle cerebral arteries (MCA): Normal. VENOUS SINUSES: As permitted by contrast timing, patent. ANATOMIC VARIANTS: None Review of the MIP images confirms the above findings. IMPRESSION: 1. No intracranial arterial occlusion or high-grade stenosis. 2. Mild bilateral carotid bifurcation atherosclerosis and aortic atherosclerosis (ICD10-I70.0). Electronically Signed   By: Ulyses Jarred M.D.   On: 05/25/2019 22:51   MR BRAIN WO CONTRAST  Result Date: 05/25/2019 CLINICAL DATA:  Vision loss. Suspected right central retinal artery occlusion. Right eye vision loss for 3-4 days. EXAM: MRI HEAD WITHOUT CONTRAST TECHNIQUE: Multiplanar, multiecho pulse sequences of the brain and surrounding structures were obtained without intravenous contrast. COMPARISON:  None. FINDINGS: BRAIN: There is no acute infarct, acute hemorrhage or extra-axial collection. Mild white matter hyperintensity, most commonly due to chronic ischemic microangiopathy, though not unexpected for age. There is generalized atrophy without lobar predilection. The midline structures are normal. VASCULAR: The major intracranial arterial and venous sinus flow voids are normal. Susceptibility-sensitive sequences show no chronic microhemorrhage or superficial siderosis. SKULL AND UPPER CERVICAL SPINE: Calvarial bone marrow signal is normal. There is no skull base mass. The visualized upper cervical spine and soft tissues are normal. SINUSES/ORBITS: There are no fluid levels or advanced mucosal thickening. The mastoid air cells and middle ear cavities are free of fluid. The orbits are normal. IMPRESSION: 1. No acute intracranial abnormality. 2. Generalized atrophy without lobar predilection. Electronically Signed   By: Ulyses Jarred M.D.   On: 05/25/2019 22:04    ekg nsr at 45, RAD, RBBB (new compared to 2016), no st-t changes c/w ischemia  Assessment & Plan:     Principal Problem:   Retinal artery occlusion Active Problems:   Hypertension   Gout  Retinal artery occlusion MRI brain -> negative for CVA Check CTA neck/ head Check cardiac echo Check hga1c, lipid Aspirin 325mg  po qday Lipitor 80mg  po qhs Neurology consulted by ED, appreciate input  Hypertension Cont Losartan 100mg  po qday  Gout Cont Allopurinol 200mg  po qday   DVT Prophylaxis-   Lovenox - SCDs   AM Labs Ordered, also please review Full Orders  Family Communication: Admission, patients condition and plan of care including tests being ordered have been discussed with the patient  who indicate understanding and agree with the plan and Code Status.  Code Status:  FULL CODE per patient  Admission status: Observation: Based on patients clinical presentation and evaluation of above clinical data, I have made determination that patient meets Observation criteria at this time.   Time spent in minutes : 55 minutes    Jani Gravel M.D on 05/25/2019 at 10:54 PM

## 2019-05-25 NOTE — ED Notes (Addendum)
Checked in on Pt, has gone to MRI, waiting for CT.  Pt aware he can not eat or drink at this time.  Pt states he is comfortable at this time.  No changes noted

## 2019-05-25 NOTE — ED Provider Notes (Signed)
[facility] DEPARTMENT Provider Note   CSN: [id number] Arrival date & time: 05/25/19  1034     History Chief Complaint  Patient presents with  . ARTERIAL OCCLUSION    Chad Serrano is a 74 y.o. male presents to ER from ophthalmologist Dr Princess Bruins ([place]) for evaluation of right eye vision loss and diagnosed retinal artery occlusion in office earlier today.  Patient states he was told he had an eye stroke and was told to come to ER to rule out other strokes.  He had sudden onset right eye vision loss on Thursday afternoon while sitting down watching TV. Describes his vision as gray in the inner corner and center aspect of his vision, he can see some movements and light on peripheral vision of this eye.  Denies any other stroke symptoms including diplopia, difficulty with speech, unilateral weakness or numbness. No associated headache, eye pain, eye redness, tearing, trauma. No recent eye surgeries. Denies h/o strokes. No recent head or neck injury, falls. States he tried to dry eye drops without relief. No modifying factors.   HPI     Past Medical History:  Diagnosis Date  . Anxiety   . Chronic kidney disease   . Gout   . Hyperlipidemia   . Hypertension   . Hypogonadism male   . Hypogonadism male   . Melanoma ([place])    at T4 surgically excised at [facility] (2020) free margins and sentinel node biopsy negative    Patient Active Problem List   Diagnosis Date Noted  . 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 Melanoma ([place])   . Pulmonary infiltrates 10/02/2018  . Hypogonadism male 08/05/2012  . Other and unspecified hyperlipidemia 08/05/2012  . Hypertension   . Gout     Past Surgical History:  Procedure Laterality Date  . APPENDECTOMY    . COLONOSCOPY    . ROTATOR CUFF REPAIR Right        Family History  Problem Relation Age of Onset  . Colon polyps Father   . Colon cancer Neg Hx   . Esophageal cancer Neg Hx   . Rectal cancer Neg  Hx   . Stomach cancer Neg Hx     Social History   Tobacco Use  . Smoking status: Never Smoker  . Smokeless tobacco: Never Used  Substance Use Topics  . Alcohol use: No  . Drug use: No    Home Medications Prior to Admission medications   Medication Sig Start Date End Date Taking? Authorizing Provider  allopurinol (ZYLOPRIM) 100 MG tablet TAKE 2 TABLETS BY MOUTH EVERY DAY 11/07/18   Susy Frizzle, MD  aspirin 81 MG tablet Take 81 mg by mouth daily.    [provider]  azelastine (ASTELIN) 0.1 % nasal spray SPRAY 2 SPRAYS INTO EACH NOSTRIL TWICE A DAY AS DIRECTED 12/19/18   Tanda Rockers, MD  benzonatate (TESSALON) 200 MG capsule Take 1 capsule (200 mg total) by mouth 3 (three) times daily as needed for cough. 10/31/18   Martyn Ehrich, NP  Cholecalciferol (VITAMIN D3) 2000 UNITS TABS Take by mouth.    [provider]  L-Arginine 500 MG TABS Take by mouth.    [provider]  losartan (COZAAR) 100 MG tablet TAKE 1 TABLET BY MOUTH EVERY DAY 11/04/18   Susy Frizzle, MD  Magnesium 500 MG CAPS Take by mouth.    [provider]  Multiple Vitamins-Minerals (CENTRUM SILVER ADULT 50+) TABS Take by mouth.  [provider]  Omega-3 Fatty Acids (FISH OIL) 1000 MG CAPS Take by mouth.    [provider]  traMADol (ULTRAM) 50 MG tablet Take 50 mg by mouth every 12 (twelve) hours as needed for moderate pain.    [provider]  vitamin B-12 (CYANOCOBALAMIN) 1000 MCG tablet Take 3,000 mcg by mouth daily.    [provider]  Zinc 50 MG TABS Take by mouth.    [provider]    Allergies    Patient has no known allergies.  Review of Systems   Review of Systems  Eyes: Positive for visual disturbance.  All other systems reviewed and are negative.   Physical Exam Updated Vital Signs BP (!) 150/88 (BP Location: Left Arm)   Pulse 70   Temp 98.5 F (36.9 C) (Oral)   Resp 18   Ht 5\' 11"  (1.803 m)   Wt  99.8 kg   SpO2 97%   BMI 30.68 kg/m   Physical Exam Constitutional:      Appearance: He is well-developed.  HENT:     Head: Normocephalic.     Comments: No temporal tenderness     Nose: Nose normal.  Eyes:     General: Lids are normal.     Comments: Bilateral injected/prominent scleral vessel. PERRL and EOMs bilaterally.   Cardiovascular:     Rate and Rhythm: Normal rate.  Pulmonary:     Effort: Pulmonary effort is normal.  Musculoskeletal:        General: Normal range of motion.     Cervical back: Normal range of motion.  Neurological:     Mental Status: He is alert.     Comments:  Mental Status: Patient is awake, alert, oriented to person, place, year, and situation.  Patient is able to give a clear and coherent history.  Speech is fluent and clear without dysarthria or aphasia. No signs of neglect.  Cranial Nerves: I not tested II visual fields full bilaterally. PERRL.  Unable to visualize posterior eye. III, IV, VI EOMs intact without ptosis or diplopia  V sensation to light touch intact in all 3 divisions of trigeminal nerve bilaterally  VII facial movements symmetric bilaterally VIII hearing intact to voice/conversation  IX, X no uvula deviation, symmetric rise of soft palate/uvula XI 5/5 SCM and trapezius strength bilaterally  XII tongue protrusion midline, symmetric L/R movements  Motor: Strength 5/5 in upper/lower extremities .   Sensation to light touch intact in face, upper/lower extremities. No pronator drift. No leg drop.  Cerebellar: No ataxia with finger to nose.  Steady gait  Psychiatric:        Behavior: Behavior normal.     ED Results / Procedures / Treatments   Labs (all labs ordered are listed, but only abnormal results are displayed) Labs Reviewed  CBC - Abnormal; Notable for the following components:      Result Value   WBC 10.7 (*)    All other components within normal limits  DIFFERENTIAL - Abnormal; Notable for the following  components:   Monocytes Absolute 1.1 (*)    All other components within normal limits  COMPREHENSIVE METABOLIC PANEL - Abnormal; Notable for the following components:   Creatinine, Ser 1.26 (*)    Total Bilirubin 1.3 (*)    GFR calc non Af Amer 56 (*)    All other components within normal limits  SARS CORONAVIRUS 2 (TAT 6-24 HRS)  PROTIME-INR  APTT  SEDIMENTATION RATE  C-REACTIVE PROTEIN  HEMOGLOBIN A1C  LIPID PANEL  COMPREHENSIVE METABOLIC PANEL    EKG EKG Interpretation  Date/Time:  Monday May 25 2019 10:44:57 EST Ventricular Rate:  81 PR Interval:  150 QRS Duration: 128 QT Interval:  374 QTC Calculation: 434 R Axis:   145 Text Interpretation: ** Suspect arm lead reversal, interpretation assumes no reversal Normal sinus rhythm Right bundle branch block Left posterior fascicular block ** Bifascicular block ** Abnormal ECG Confirmed by Quintella Reichert [phone]) on 05/25/2019 8:16:56 PM   Radiology CT Angio Head W or Wo Contrast  Result Date: 05/25/2019 CLINICAL DATA:  Right eye vision loss EXAM: CT ANGIOGRAPHY HEAD AND NECK TECHNIQUE: Multidetector CT imaging of the head and neck was performed using the standard protocol during bolus administration of intravenous contrast. Multiplanar CT image reconstructions and MIPs were obtained to evaluate the vascular anatomy. Carotid stenosis measurements (when applicable) are obtained utilizing NASCET criteria, using the distal internal carotid diameter as the denominator. CONTRAST:  168mL OMNIPAQUE IOHEXOL 350 MG/ML SOLN COMPARISON:  Brain MRI same day FINDINGS: CT HEAD FINDINGS Brain: There is no mass, hemorrhage or extra-axial collection. There is generalized atrophy without lobar predilection. There is no acute or chronic infarction. There is hypoattenuation of the periventricular white matter, most commonly indicating chronic ischemic microangiopathy. Skull: The visualized skull base, calvarium and extracranial soft tissues are normal.  Sinuses/Orbits: No fluid levels or advanced mucosal thickening of the visualized paranasal sinuses. No mastoid or middle ear effusion. The orbits are normal. CTA NECK FINDINGS SKELETON: There is no bony spinal canal stenosis. No lytic or blastic lesion. OTHER NECK: Normal pharynx, larynx and major salivary glands. No cervical lymphadenopathy. Unremarkable thyroid gland. UPPER CHEST: No pneumothorax or pleural effusion. No nodules or masses. AORTIC ARCH: There is mild calcific atherosclerosis of the aortic arch. There is no aneurysm, dissection or hemodynamically significant stenosis of the visualized portion of the aorta. Conventional 3 vessel aortic branching pattern. The visualized proximal subclavian arteries are widely patent. RIGHT CAROTID SYSTEM: No dissection, occlusion or aneurysm. Mild atherosclerotic calcification at the carotid bifurcation without hemodynamically significant stenosis. LEFT CAROTID SYSTEM: No dissection, occlusion or aneurysm. Mild atherosclerotic calcification at the carotid bifurcation without hemodynamically significant stenosis. VERTEBRAL ARTERIES: Left dominant configuration. Both origins are clearly patent. There is no dissection, occlusion or flow-limiting stenosis to the skull base (V1-V3 segments). CTA HEAD FINDINGS POSTERIOR CIRCULATION: --Vertebral arteries: Normal V4 segments. --Posterior inferior cerebellar arteries (PICA): Patent origins from the vertebral arteries. --Anterior inferior cerebellar arteries (AICA): Patent origins from the basilar artery. --Basilar artery: Normal. --Superior cerebellar arteries: Normal. --Posterior cerebral arteries: Normal. Both are predominantly supplied by the posterior communicating arteries (p-comm). ANTERIOR CIRCULATION: --Intracranial internal carotid arteries: Normal. --Anterior cerebral arteries (ACA): Normal. Both A1 segments are present. Patent anterior communicating artery (a-comm). --Middle cerebral arteries (MCA): Normal. VENOUS  SINUSES: As permitted by contrast timing, patent. ANATOMIC VARIANTS: None Review of the MIP images confirms the above findings. IMPRESSION: 1. No intracranial arterial occlusion or high-grade stenosis. 2. Mild bilateral carotid bifurcation atherosclerosis and aortic atherosclerosis (ICD10-I70.0). Electronically Signed   By: Ulyses Jarred M.D.   On: 05/25/2019 22:51   DG Chest 2 View  Result Date: 05/25/2019 CLINICAL DATA:  Retinal artery occlusion EXAM: CHEST - 2 VIEW COMPARISON:  09/15/2018 FINDINGS: The heart size and mediastinal contours are within normal limits. Both lungs are hyperinflated. The visualized skeletal structures are unremarkable. IMPRESSION: COPD without acute abnormality. Electronically Signed   By: Inez Catalina M.D.   On: 05/25/2019 23:07   CT Angio Neck  W and/or Wo Contrast  Result Date: 05/25/2019 CLINICAL DATA:  Right eye vision loss EXAM: CT ANGIOGRAPHY HEAD AND NECK TECHNIQUE: Multidetector CT imaging of the head and neck was performed using the standard protocol during bolus administration of intravenous contrast. Multiplanar CT image reconstructions and MIPs were obtained to evaluate the vascular anatomy. Carotid stenosis measurements (when applicable) are obtained utilizing NASCET criteria, using the distal internal carotid diameter as the denominator. CONTRAST:  122mL OMNIPAQUE IOHEXOL 350 MG/ML SOLN COMPARISON:  Brain MRI same day FINDINGS: CT HEAD FINDINGS Brain: There is no mass, hemorrhage or extra-axial collection. There is generalized atrophy without lobar predilection. There is no acute or chronic infarction. There is hypoattenuation of the periventricular white matter, most commonly indicating chronic ischemic microangiopathy. Skull: The visualized skull base, calvarium and extracranial soft tissues are normal. Sinuses/Orbits: No fluid levels or advanced mucosal thickening of the visualized paranasal sinuses. No mastoid or middle ear effusion. The orbits are normal. CTA  NECK FINDINGS SKELETON: There is no bony spinal canal stenosis. No lytic or blastic lesion. OTHER NECK: Normal pharynx, larynx and major salivary glands. No cervical lymphadenopathy. Unremarkable thyroid gland. UPPER CHEST: No pneumothorax or pleural effusion. No nodules or masses. AORTIC ARCH: There is mild calcific atherosclerosis of the aortic arch. There is no aneurysm, dissection or hemodynamically significant stenosis of the visualized portion of the aorta. Conventional 3 vessel aortic branching pattern. The visualized proximal subclavian arteries are widely patent. RIGHT CAROTID SYSTEM: No dissection, occlusion or aneurysm. Mild atherosclerotic calcification at the carotid bifurcation without hemodynamically significant stenosis. LEFT CAROTID SYSTEM: No dissection, occlusion or aneurysm. Mild atherosclerotic calcification at the carotid bifurcation without hemodynamically significant stenosis. VERTEBRAL ARTERIES: Left dominant configuration. Both origins are clearly patent. There is no dissection, occlusion or flow-limiting stenosis to the skull base (V1-V3 segments). CTA HEAD FINDINGS POSTERIOR CIRCULATION: --Vertebral arteries: Normal V4 segments. --Posterior inferior cerebellar arteries (PICA): Patent origins from the vertebral arteries. --Anterior inferior cerebellar arteries (AICA): Patent origins from the basilar artery. --Basilar artery: Normal. --Superior cerebellar arteries: Normal. --Posterior cerebral arteries: Normal. Both are predominantly supplied by the posterior communicating arteries (p-comm). ANTERIOR CIRCULATION: --Intracranial internal carotid arteries: Normal. --Anterior cerebral arteries (ACA): Normal. Both A1 segments are present. Patent anterior communicating artery (a-comm). --Middle cerebral arteries (MCA): Normal. VENOUS SINUSES: As permitted by contrast timing, patent. ANATOMIC VARIANTS: None Review of the MIP images confirms the above findings. IMPRESSION: 1. No intracranial  arterial occlusion or high-grade stenosis. 2. Mild bilateral carotid bifurcation atherosclerosis and aortic atherosclerosis (ICD10-I70.0). Electronically Signed   By: Ulyses Jarred M.D.   On: 05/25/2019 22:51   MR BRAIN WO CONTRAST  Result Date: 05/25/2019 CLINICAL DATA:  Vision loss. Suspected right central retinal artery occlusion. Right eye vision loss for 3-4 days. EXAM: MRI HEAD WITHOUT CONTRAST TECHNIQUE: Multiplanar, multiecho pulse sequences of the brain and surrounding structures were obtained without intravenous contrast. COMPARISON:  None. FINDINGS: BRAIN: There is no acute infarct, acute hemorrhage or extra-axial collection. Mild white matter hyperintensity, most commonly due to chronic ischemic microangiopathy, though not unexpected for age. There is generalized atrophy without lobar predilection. The midline structures are normal. VASCULAR: The major intracranial arterial and venous sinus flow voids are normal. Susceptibility-sensitive sequences show no chronic microhemorrhage or superficial siderosis. SKULL AND UPPER CERVICAL SPINE: Calvarial bone marrow signal is normal. There is no skull base mass. The visualized upper cervical spine and soft tissues are normal. SINUSES/ORBITS: There are no fluid levels or advanced mucosal thickening. The mastoid air cells and middle  ear cavities are free of fluid. The orbits are normal. IMPRESSION: 1. No acute intracranial abnormality. 2. Generalized atrophy without lobar predilection. Electronically Signed   By: Ulyses Jarred M.D.   On: 05/25/2019 22:04    Procedures .Critical Care Performed by: Kinnie Feil, PA-C Authorized by: Kinnie Feil, PA-C   Critical care provider statement:    Critical care time (minutes):  45   Critical care was necessary to treat or prevent imminent or life-threatening deterioration of the following conditions:  CNS failure or compromise (CRAO)   Critical care was time spent personally by me on the following  activities:  Discussions with consultants, evaluation of patient's response to treatment, examination of patient, ordering and performing treatments and interventions, ordering and review of laboratory studies, ordering and review of radiographic studies, pulse oximetry, re-evaluation of patient's condition, obtaining history from patient or surrogate, review of old charts and development of treatment plan with patient or surrogate   I assumed direction of critical care for this patient from another provider in my specialty: no     (including critical care time)  Medications Ordered in ED Medications  sodium chloride flush (NS) 0.9 % injection 3 mL (has no administration in time range)  LORazepam (ATIVAN) injection 1 mg (1 mg Intravenous Given 05/25/19 2107)  iohexol (OMNIPAQUE) 350 MG/ML injection 75 mL (has no administration in time range)   stroke: mapping our early stages of recovery book (has no administration in time range)  acetaminophen (TYLENOL) tablet 650 mg (has no administration in time range)    Or  acetaminophen (TYLENOL) 160 MG/5ML solution 650 mg (has no administration in time range)    Or  acetaminophen (TYLENOL) suppository 650 mg (has no administration in time range)  enoxaparin (LOVENOX) injection 40 mg (has no administration in time range)  aspirin suppository 300 mg (has no administration in time range)    Or  aspirin tablet 325 mg (has no administration in time range)  0.9 %  sodium chloride infusion (has no administration in time range)  iohexol (OMNIPAQUE) 350 MG/ML injection 100 mL (100 mLs Intravenous Contrast Given 05/25/19 2108)    ED Course  I have reviewed the triage vital signs and the nursing notes.  Pertinent labs & imaging results that were available during my care of the patient were reviewed by me and considered in my medical decision making (see chart for details).    MDM Rules/Calculators/A&P                      74 yo M with h/o HTN, HLD presents  to ER for painless sudden right eye vision loss on Thursday.  Reports being diagnosed with eye stroke by ophthalmologist earlier today.   He is able to see hand motions/light on right eye temporal peripheral fields only.  No other neuro deficits on exam.   ER work up initiated in triage personally reviewed, labs non acute. Will order stroke imaging and consult neuro.   Spoke to neurologist Dr Aroor who recommends CTA head/neck and MRI brain, neuro to see and give further recommendations.   2320: Spoke to Dr Leonel Ramsay who has evaluated pt in ER recommends stroke admission for echo, etc.  Admitted by Dr Maudie Mercury. Discussed with EDP.   Final Clinical Impression(s) / ED Diagnoses Final diagnoses:  Retinal artery occlusion    Rx / DC Orders ED Discharge Orders    None       Kinnie Feil, PA-C 05/25/19  [place], [place], MD 05/28/19 [phone]

## 2019-05-25 NOTE — ED Notes (Signed)
Patient transported to X-ray 

## 2019-05-26 ENCOUNTER — Observation Stay (HOSPITAL_BASED_OUTPATIENT_CLINIC_OR_DEPARTMENT_OTHER): Payer: Medicare Other

## 2019-05-26 ENCOUNTER — Other Ambulatory Visit: Payer: Self-pay | Admitting: Student

## 2019-05-26 DIAGNOSIS — I6389 Other cerebral infarction: Secondary | ICD-10-CM | POA: Diagnosis not present

## 2019-05-26 DIAGNOSIS — I639 Cerebral infarction, unspecified: Secondary | ICD-10-CM

## 2019-05-26 DIAGNOSIS — I10 Essential (primary) hypertension: Secondary | ICD-10-CM

## 2019-05-26 DIAGNOSIS — H3411 Central retinal artery occlusion, right eye: Secondary | ICD-10-CM

## 2019-05-26 DIAGNOSIS — H349 Unspecified retinal vascular occlusion: Secondary | ICD-10-CM | POA: Diagnosis not present

## 2019-05-26 DIAGNOSIS — M109 Gout, unspecified: Secondary | ICD-10-CM | POA: Diagnosis not present

## 2019-05-26 DIAGNOSIS — H547 Unspecified visual loss: Secondary | ICD-10-CM

## 2019-05-26 LAB — ECHOCARDIOGRAM COMPLETE
Height: 71 in
Weight: 3520 oz

## 2019-05-26 LAB — COMPREHENSIVE METABOLIC PANEL
ALT: 25 U/L (ref 0–44)
AST: 21 U/L (ref 15–41)
Albumin: 3.3 g/dL — ABNORMAL LOW (ref 3.5–5.0)
Alkaline Phosphatase: 62 U/L (ref 38–126)
Anion gap: 11 (ref 5–15)
BUN: 20 mg/dL (ref 8–23)
CO2: 22 mmol/L (ref 22–32)
Calcium: 8.6 mg/dL — ABNORMAL LOW (ref 8.9–10.3)
Chloride: 104 mmol/L (ref 98–111)
Creatinine, Ser: 1.1 mg/dL (ref 0.61–1.24)
GFR calc Af Amer: >60 mL/min (ref >60)
GFR calc non Af Amer: >60 mL/min (ref >60)
Glucose, Bld: 92 mg/dL (ref 70–99)
Potassium: 4.2 mmol/L (ref 3.5–5.1)
Sodium: 137 mmol/L (ref 135–145)
Total Bilirubin: 1.2 mg/dL (ref 0.3–1.2)
Total Protein: 5.9 g/dL — ABNORMAL LOW (ref 6.5–8.1)

## 2019-05-26 LAB — SARS CORONAVIRUS 2 (TAT 6-24 HRS): SARS Coronavirus 2: NEGATIVE

## 2019-05-26 LAB — LIPID PANEL
Cholesterol: 193 mg/dL (ref 0–200)
HDL: 40 mg/dL — ABNORMAL LOW (ref >40)
LDL Cholesterol: 135 mg/dL — ABNORMAL HIGH (ref 0–99)
Total CHOL/HDL Ratio: 4.8 RATIO
Triglycerides: 89 mg/dL (ref <150)
VLDL: 18 mg/dL (ref 0–40)

## 2019-05-26 LAB — SEDIMENTATION RATE: Sed Rate: 2 mm/hr (ref 0–16)

## 2019-05-26 LAB — C-REACTIVE PROTEIN: CRP: <0.5 mg/dL (ref <1.0)

## 2019-05-26 MED ORDER — ALLOPURINOL 100 MG PO TABS
200.0000 mg | ORAL_TABLET | Freq: Every day | ORAL | Status: DC
  Administered 2019-05-26: 200 mg via ORAL
  Filled 2019-05-26 (×2): qty 2

## 2019-05-26 MED ORDER — ATORVASTATIN CALCIUM 40 MG PO TABS: 40.0000 mg | ORAL_TABLET | Freq: Every day | ORAL | 1 refills | Status: DC

## 2019-05-26 MED ORDER — TRAMADOL HCL 50 MG PO TABS: 50.0000 mg | ORAL_TABLET | Freq: Two times a day (BID) | ORAL | Status: DC | PRN

## 2019-05-26 MED ORDER — BENZONATATE 100 MG PO CAPS: 200.0000 mg | ORAL_CAPSULE | Freq: Three times a day (TID) | ORAL | Status: DC | PRN

## 2019-05-26 MED ORDER — VITAMIN B-12 1000 MCG PO TABS
3000.0000 ug | ORAL_TABLET | Freq: Every day | ORAL | Status: DC
  Administered 2019-05-26: 3000 ug via ORAL
  Filled 2019-05-26: qty 3

## 2019-05-26 MED ORDER — LOSARTAN POTASSIUM 50 MG PO TABS
100.0000 mg | ORAL_TABLET | Freq: Every day | ORAL | Status: DC
  Administered 2019-05-26: 100 mg via ORAL
  Filled 2019-05-26: qty 2

## 2019-05-26 MED ORDER — BENZONATATE 200 MG PO CAPS: 200.0000 mg | ORAL_CAPSULE | Freq: Three times a day (TID) | ORAL | 1 refills | Status: DC | PRN

## 2019-05-26 MED ORDER — ASPIRIN 81 MG PO TABS: 81.0000 mg | ORAL_TABLET | Freq: Every day | ORAL | 0 refills | Status: AC

## 2019-05-26 MED ORDER — ATORVASTATIN CALCIUM 40 MG PO TABS: 40.0000 mg | ORAL_TABLET | Freq: Every day | ORAL | Status: DC

## 2019-05-26 MED ORDER — CLOPIDOGREL BISULFATE 75 MG PO TABS: 75.0000 mg | ORAL_TABLET | Freq: Every day | ORAL | 11 refills | Status: AC

## 2019-05-26 NOTE — Progress Notes (Signed)
  Echocardiogram 2D Echocardiogram has been performed.  Chad Serrano 05/26/2019, 11:42 AM

## 2019-05-26 NOTE — ED Notes (Signed)
Lunch Tray Ordered @ 1132.  

## 2019-05-26 NOTE — Evaluation (Addendum)
Occupational Therapy Evaluation and Discharge Patient Details Name: Chad Serrano MRN: [id number] DOB: 01/18/1946 Today's Date: 05/26/2019    History of Present Illness Pt is a 74 y/o male with PMH of gout, hypogonadism, melanoma, HTN, presenting with R eye vision loss for the past 3-4 days. Was seen at [facility] with dx of R retinal artery occlusion, referred him to [facility] for further work-up for embolic stroke.  MRI negative.    Clinical Impression   PTA patient independent and driving. Patient presenting for above and limited by vision loss in R eye. Noted R visual field loss and impaired depth perception. Today, patient is independent with ADLs, mobility.  Educated on visual compensatory techniques, recommendations and safety. Pt already compensating during ADls, mobility with visual scanning during session.  Recommended OP neuro OT services at dc to address safety in relation return to driving and further compensatory techniques for IADLs. No further acute OT needs at this time.  OT to sign off.     Follow Up Recommendations  Outpatient OT;Supervision - Intermittent    Equipment Recommendations  None recommended by OT    Recommendations for Other Services       Precautions / Restrictions Restrictions Weight Bearing Restrictions: No      Mobility Bed Mobility Overal bed mobility: Independent                Transfers Overall transfer level: Independent                    Balance Overall balance assessment: Independent                                         ADL either performed or assessed with clinical judgement   ADL Overall ADL's : Independent                                       General ADL Comments: patient demonstrating ability to complete simulated ADLs with independence     Vision Baseline Vision/History: Wears glasses Wears Glasses: At all times Patient Visual Report: Other  (comment)(R visual loss, peripheral and visual field) Vision Assessment?: Yes Eye Alignment: Within Functional Limits Ocular Range of Motion: Within Functional Limits(R eye restricted when tested alone ) Alignment/Gaze Preference: Within Defined Limits Tracking/Visual Pursuits: Decreased smoothness of horizontal tracking;Other (comment)(able to track in all quads, decreased smoothness of R eye) Visual Fields: Right visual field deficit Diplopia Assessment: (NOT PRESENT ) Depth Perception: Undershoots Additional Comments: Testing of visual fields with significant loss in R visual field.  Completed testing with each eye individually (L eye WFL and R eye unable to track), together eyes can track in all planes; reports ability see in R eye at 45 degree angle slightly but not well, noted undershooting with reaching for finger (during finger to nose testing); pt decribes no deficits in reading on phone or computer      Perception     Praxis      Pertinent Vitals/Pain Pain Assessment: No/denies pain     Hand Dominance     Extremity/Trunk Assessment Upper Extremity Assessment Upper Extremity Assessment: Overall WFL for tasks assessed   Lower Extremity Assessment Lower Extremity Assessment: Defer to PT evaluation       Communication Communication Communication: No difficulties   Cognition  Arousal/Alertness: Awake/alert Behavior During Therapy: WFL for tasks assessed/performed Overall Cognitive Status: Within Functional Limits for tasks assessed                                     General Comments  Educated on visual compensatory techniques, which patient is already using to compensate for visual loss on R side with avoiding obstacles and preventing falls, as well as engaging in [place]. Recommended no driving at this time for safety, and recommended OP OT followup.     Exercises     Shoulder Instructions      Home Living Family/patient expects to be discharged to::  Private residence Living Arrangements: Spouse/significant other Available Help at Discharge: Family;Available 24 hours/day Type of Home: House       Home Layout: Two level;Able to live on main level with bedroom/bathroom     Bathroom Shower/Tub: Occupational psychologist: Standard     Home Equipment: None          Prior Functioning/Environment Level of Independence: Independent        Comments: driving         OT Problem List: Impaired vision/perception      OT Treatment/Interventions:      OT Goals(Current goals can be found in the care plan section) Acute Rehab OT Goals Patient Stated Goal: to get home  OT Goal Formulation: With patient  OT Frequency:     Barriers to D/C:            Co-evaluation              AM-PAC OT "6 Clicks" Daily Activity     Outcome Measure Help from another person eating meals?: None Help from another person taking care of personal grooming?: None Help from another person toileting, which includes using toliet, bedpan, or urinal?: None Help from another person bathing (including washing, rinsing, drying)?: None Help from another person to put on and taking off regular upper body clothing?: None Help from another person to put on and taking off regular lower body clothing?: None 6 Click Score: 24   End of Session    Activity Tolerance: Patient tolerated treatment well Patient left: in bed  OT Visit Diagnosis: Low vision, both eyes (H54.2)                Time: 1310-1319 OT Time Calculation (min): 9 min Charges:  OT General Charges $OT Visit: 1 Visit OT Evaluation $OT Eval Low Complexity: 1 Low  Jolaine Artist, OT Acute Rehabilitation Services Pager [phone] Office [phone]   Delight Stare 05/26/2019, 2:07 PM

## 2019-05-26 NOTE — Consult Note (Addendum)
ELECTROPHYSIOLOGY CONSULT NOTE  Patient ID: Chad Serrano MRN: [id number], DOB/AGE: 01/27/1946   Admit date: 05/25/2019 Date of Consult: 05/26/2019  Primary Physician: Susy Frizzle, MD Primary Cardiologist: No primary care provider on file.  Primary Electrophysiologist: New to Dr. Rayann Heman Reason for Consultation: Cryptogenic stroke; recommendations regarding Implantable Loop Recorder Insurance: Medicare  History of Present Illness EP has been asked to evaluate Chad Serrano for placement of an implantable loop recorder to monitor for atrial fibrillation by Dr Leonie Man.  The patient was admitted on 05/25/2019 with abrupt R sided vision loss. He sae an ophthalmologist who saw "blocked arteries" and sent him to ED.  Imaging demonstrated no acute intracranial abnormalitiy.  They have undergone workup for stroke including echocardiogram and CTA Head and Neck which showed mild bilateral carotid bifurcation atherosclerosis and aortic atherosclerosis.  The patient has been monitored on telemetry which has demonstrated sinus rhythm with no arrhythmias.  Inpatient stroke work-up will not require a TEE per Neurology.   Echocardiogram this admission demonstrated LVEF 55-60% along with "Small, highly mobile finding in right atrium seen well only in subcostal view (clip 79) may represent venous bubble vs excessive mobility of normal structure such as Eustatian valve or Chiari network."  Lab work is reviewed.  Prior to admission, the patient denies chest pain, shortness of breath, dizziness, or syncope. He states he would occasional feel his "heart beating hard" but usually only when he worked out or significantly exerted himself. He is recovering from his stroke with plans to return home  at discharge.  Past Medical History:  Diagnosis Date  . Anxiety   . Chronic kidney disease   . Gout   . Hyperlipidemia   . Hypertension   . Hypogonadism male   . Hypogonadism male   . Melanoma ([place])    at T4  surgically excised at [facility] (2020) free margins and sentinel node biopsy negative     Surgical History:  Past Surgical History:  Procedure Laterality Date  . APPENDECTOMY    . COLONOSCOPY    . ROTATOR CUFF REPAIR Right      (Not in a hospital admission)   Inpatient Medications:  .  stroke: mapping our early stages of recovery book   Does not apply Once  . allopurinol  200 mg Oral Daily  . aspirin  300 mg Rectal Daily   Or  . aspirin  325 mg Oral Daily  . enoxaparin (LOVENOX) injection  40 mg Subcutaneous Q24H  . losartan  100 mg Oral Daily  . sodium chloride flush  3 mL Intravenous Once  . vitamin B-12  3,000 mcg Oral Daily    Allergies: No Known Allergies  Social History   Socioeconomic History  . Marital status: Married    Spouse name: Not on file  . Number of children: Not on file  . Years of education: Not on file  . Highest education level: Not on file  Occupational History  . Not on file  Tobacco Use  . Smoking status: Never Smoker  . Smokeless tobacco: Never Used  Substance and Sexual Activity  . Alcohol use: No  . Drug use: No  . 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 Not on file  Social History Narrative  . Not on file   Social Determinants of Health   Financial Resource Strain:   . Difficulty of Paying Living Expenses: Not on file  Food Insecurity:   . Worried About Charity fundraiser in the Last Year: Not on file  . Ran Out of Food in the Last Year: Not on file  Transportation Needs:   . Lack of Transportation (Medical): Not on file  . Lack of Transportation (Non-Medical): Not on file  Physical Activity:   . Days of Exercise per Week: Not on file  . Minutes of Exercise per Session: Not on file  Stress:   . Feeling of Stress : Not on file  Social Connections:   . Frequency of Communication with Friends and Family: Not on file  . Frequency of Social Gatherings with Friends  and Family: Not on file  . Attends Religious Services: Not on file  . Active Member of Clubs or Organizations: Not on file  . Attends Archivist Meetings: Not on file  . Marital Status: Not on file  Intimate Partner Violence:   . Fear of Current or Ex-Partner: Not on file  . Emotionally Abused: Not on file  . Physically Abused: Not on file  . Sexually Abused: Not on file     Family History  Problem Relation Age of Onset  . Colon polyps Father   . Colon cancer Neg Hx   . Esophageal cancer Neg Hx   . Rectal cancer Neg Hx   . Stomach cancer Neg Hx       Review of Systems: All other systems reviewed and are otherwise negative except as noted above.  Physical Exam: Vitals:   05/25/19 1621 05/25/19 2314 05/26/19 0510 05/26/19 0928  BP: (!) 154/84 (!) 150/88 140/69 (!) 154/88  Pulse: 72 70 70 76  Resp: 18 18 16 15   Temp:   98.3 F (36.8 C)   TempSrc:   Oral   SpO2: 100% 97% 99% 99%  Weight:      Height:        GEN- The patient is well appearing, alert and oriented x 3 today.   Head- normocephalic, atraumatic Eyes-  Sclera clear, conjunctiva pink Ears- hearing intact Oropharynx- clear Neck- supple Lungs- Clear to ausculation bilaterally, normal work of breathing Heart- Regular rate and rhythm, no murmurs, rubs or gallops  GI- soft, NT, ND, + BS Extremities- no clubbing, cyanosis, or edema MS- no significant deformity or atrophy Skin- no rash or lesion Psych- euthymic mood, full affect   Labs:   Lab Results  Component Value Date   WBC 10.7 (H) 05/25/2019   HGB 15.8 05/25/2019   HCT 47.2 05/25/2019   MCV 98.3 05/25/2019   PLT 205 05/25/2019    Recent Labs  Lab 05/26/19 0625  NA 137  K 4.2  CL 104  CO2 22  BUN 20  CREATININE 1.10  CALCIUM 8.6*  PROT 5.9*  BILITOT 1.2  ALKPHOS 62  ALT 25  AST 21  GLUCOSE 92     Radiology/Studies: CT Angio Head W or Wo Contrast  Result Date: 05/25/2019 CLINICAL DATA:  Right eye vision loss EXAM: CT  ANGIOGRAPHY HEAD AND NECK TECHNIQUE: Multidetector CT imaging of the head and neck was performed using the standard protocol during bolus administration of intravenous contrast. Multiplanar CT image reconstructions and MIPs were obtained to evaluate the vascular anatomy. Carotid stenosis measurements (when applicable) are obtained utilizing NASCET criteria, using the distal internal carotid diameter as the denominator. CONTRAST:  160mL OMNIPAQUE IOHEXOL 350 MG/ML SOLN COMPARISON:  Brain MRI same day FINDINGS: CT HEAD FINDINGS Brain: There is no mass, hemorrhage or extra-axial  collection. There is generalized atrophy without lobar predilection. There is no acute or chronic infarction. There is hypoattenuation of the periventricular white matter, most commonly indicating chronic ischemic microangiopathy. Skull: The visualized skull base, calvarium and extracranial soft tissues are normal. Sinuses/Orbits: No fluid levels or advanced mucosal thickening of the visualized paranasal sinuses. No mastoid or middle ear effusion. The orbits are normal. CTA NECK FINDINGS SKELETON: There is no bony spinal canal stenosis. No lytic or blastic lesion. OTHER NECK: Normal pharynx, larynx and major salivary glands. No cervical lymphadenopathy. Unremarkable thyroid gland. UPPER CHEST: No pneumothorax or pleural effusion. No nodules or masses. AORTIC ARCH: There is mild calcific atherosclerosis of the aortic arch. There is no aneurysm, dissection or hemodynamically significant stenosis of the visualized portion of the aorta. Conventional 3 vessel aortic branching pattern. The visualized proximal subclavian arteries are widely patent. RIGHT CAROTID SYSTEM: No dissection, occlusion or aneurysm. Mild atherosclerotic calcification at the carotid bifurcation without hemodynamically significant stenosis. LEFT CAROTID SYSTEM: No dissection, occlusion or aneurysm. Mild atherosclerotic calcification at the carotid bifurcation without  hemodynamically significant stenosis. VERTEBRAL ARTERIES: Left dominant configuration. Both origins are clearly patent. There is no dissection, occlusion or flow-limiting stenosis to the skull base (V1-V3 segments). CTA HEAD FINDINGS POSTERIOR CIRCULATION: --Vertebral arteries: Normal V4 segments. --Posterior inferior cerebellar arteries (PICA): Patent origins from the vertebral arteries. --Anterior inferior cerebellar arteries (AICA): Patent origins from the basilar artery. --Basilar artery: Normal. --Superior cerebellar arteries: Normal. --Posterior cerebral arteries: Normal. Both are predominantly supplied by the posterior communicating arteries (p-comm). ANTERIOR CIRCULATION: --Intracranial internal carotid arteries: Normal. --Anterior cerebral arteries (ACA): Normal. Both A1 segments are present. Patent anterior communicating artery (a-comm). --Middle cerebral arteries (MCA): Normal. VENOUS SINUSES: As permitted by contrast timing, patent. ANATOMIC VARIANTS: None Review of the MIP images confirms the above findings. IMPRESSION: 1. No intracranial arterial occlusion or high-grade stenosis. 2. Mild bilateral carotid bifurcation atherosclerosis and aortic atherosclerosis (ICD10-I70.0). Electronically Signed   By: Ulyses Jarred M.D.   On: 05/25/2019 22:51   DG Chest 2 View  Result Date: 05/25/2019 CLINICAL DATA:  Retinal artery occlusion EXAM: CHEST - 2 VIEW COMPARISON:  09/15/2018 FINDINGS: The heart size and mediastinal contours are within normal limits. Both lungs are hyperinflated. The visualized skeletal structures are unremarkable. IMPRESSION: COPD without acute abnormality. Electronically Signed   By: Inez Catalina M.D.   On: 05/25/2019 23:07   CT Angio Neck W and/or Wo Contrast  Result Date: 05/25/2019 CLINICAL DATA:  Right eye vision loss EXAM: CT ANGIOGRAPHY HEAD AND NECK TECHNIQUE: Multidetector CT imaging of the head and neck was performed using the standard protocol during bolus administration of  intravenous contrast. Multiplanar CT image reconstructions and MIPs were obtained to evaluate the vascular anatomy. Carotid stenosis measurements (when applicable) are obtained utilizing NASCET criteria, using the distal internal carotid diameter as the denominator. CONTRAST:  150mL OMNIPAQUE IOHEXOL 350 MG/ML SOLN COMPARISON:  Brain MRI same day FINDINGS: CT HEAD FINDINGS Brain: There is no mass, hemorrhage or extra-axial collection. There is generalized atrophy without lobar predilection. There is no acute or chronic infarction. There is hypoattenuation of the periventricular white matter, most commonly indicating chronic ischemic microangiopathy. Skull: The visualized skull base, calvarium and extracranial soft tissues are normal. Sinuses/Orbits: No fluid levels or advanced mucosal thickening of the visualized paranasal sinuses. No mastoid or middle ear effusion. The orbits are normal. CTA NECK FINDINGS SKELETON: There is no bony spinal canal stenosis. No lytic or blastic lesion. OTHER NECK: Normal pharynx, larynx and major  salivary glands. No cervical lymphadenopathy. Unremarkable thyroid gland. UPPER CHEST: No pneumothorax or pleural effusion. No nodules or masses. AORTIC ARCH: There is mild calcific atherosclerosis of the aortic arch. There is no aneurysm, dissection or hemodynamically significant stenosis of the visualized portion of the aorta. Conventional 3 vessel aortic branching pattern. The visualized proximal subclavian arteries are widely patent. RIGHT CAROTID SYSTEM: No dissection, occlusion or aneurysm. Mild atherosclerotic calcification at the carotid bifurcation without hemodynamically significant stenosis. LEFT CAROTID SYSTEM: No dissection, occlusion or aneurysm. Mild atherosclerotic calcification at the carotid bifurcation without hemodynamically significant stenosis. VERTEBRAL ARTERIES: Left dominant configuration. Both origins are clearly patent. There is no dissection, occlusion or  flow-limiting stenosis to the skull base (V1-V3 segments). CTA HEAD FINDINGS POSTERIOR CIRCULATION: --Vertebral arteries: Normal V4 segments. --Posterior inferior cerebellar arteries (PICA): Patent origins from the vertebral arteries. --Anterior inferior cerebellar arteries (AICA): Patent origins from the basilar artery. --Basilar artery: Normal. --Superior cerebellar arteries: Normal. --Posterior cerebral arteries: Normal. Both are predominantly supplied by the posterior communicating arteries (p-comm). ANTERIOR CIRCULATION: --Intracranial internal carotid arteries: Normal. --Anterior cerebral arteries (ACA): Normal. Both A1 segments are present. Patent anterior communicating artery (a-comm). --Middle cerebral arteries (MCA): Normal. VENOUS SINUSES: As permitted by contrast timing, patent. ANATOMIC VARIANTS: None Review of the MIP images confirms the above findings. IMPRESSION: 1. No intracranial arterial occlusion or high-grade stenosis. 2. Mild bilateral carotid bifurcation atherosclerosis and aortic atherosclerosis (ICD10-I70.0). Electronically Signed   By: Ulyses Jarred M.D.   On: 05/25/2019 22:51   MR BRAIN WO CONTRAST  Result Date: 05/25/2019 CLINICAL DATA:  Vision loss. Suspected right central retinal artery occlusion. Right eye vision loss for 3-4 days. EXAM: MRI HEAD WITHOUT CONTRAST TECHNIQUE: Multiplanar, multiecho pulse sequences of the brain and surrounding structures were obtained without intravenous contrast. COMPARISON:  None. FINDINGS: BRAIN: There is no acute infarct, acute hemorrhage or extra-axial collection. Mild white matter hyperintensity, most commonly due to chronic ischemic microangiopathy, though not unexpected for age. There is generalized atrophy without lobar predilection. The midline structures are normal. VASCULAR: The major intracranial arterial and venous sinus flow voids are normal. Susceptibility-sensitive sequences show no chronic microhemorrhage or superficial siderosis.  SKULL AND UPPER CERVICAL SPINE: Calvarial bone marrow signal is normal. There is no skull base mass. The visualized upper cervical spine and soft tissues are normal. SINUSES/ORBITS: There are no fluid levels or advanced mucosal thickening. The mastoid air cells and middle ear cavities are free of fluid. The orbits are normal. IMPRESSION: 1. No acute intracranial abnormality. 2. Generalized atrophy without lobar predilection. Electronically Signed   By: Ulyses Jarred M.D.   On: 05/25/2019 22:04    12-lead ECG NSR at 81 bpm, PR interval 150 ms, QRS 128 ms (personally reviewed) All prior EKG's in EPIC reviewed with no documented atrial fibrillation  Telemetry NSR 60-70s (personally reviewed)  Assessment and Plan:  1. 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  I spoke at length with the patient about monitoring for afib with an implantable loop recorder.  Risks, benefits, and alteratives to implantable loop recorder were discussed with the patient today.   At this time, the patient refuses loop consideration, and has opted to wear an event monitor. This and follow up in 6-8 weeks has been arranged.   Of note, Echocardiogram this admission demonstrated LVEF 55-60% along with "Small, highly mobile finding in right atrium seen well only in subcostal view (clip 79) may represent venous bubble vs excessive mobility of normal structure  such as Eustatian valve or Chiari network."   Discussed personally with Dr. Margaretann Loveless.  Very difficult to visualize due to study, and unlikely to have contributed to stroke. Unclear significance.   Shirley Friar, PA-C 05/26/2019 11:28 AM  I have seen, examined the patient, and reviewed the above assessment and plan.  Changes to above are made where necessary.  On exam, RRR.  The patient declines ILR at this time.  We will therefore place 30 day monitor and have him follow-up in the office.  Co Sign: Thompson Grayer, MD 05/26/2019 8:03 PM

## 2019-05-26 NOTE — ED Notes (Signed)
Tele   Breakfast ordered  

## 2019-05-26 NOTE — Discharge Summary (Addendum)
Physician Discharge Summary  Chad Serrano [id number] DOB: November 24, 1945 DOA: 05/25/2019  PCP: Susy Frizzle, MD  Admit date: 05/25/2019 Discharge date: 05/26/2019  Time spent: 45 minutes  Recommendations for Outpatient Follow-up:  1. Follow up with neurology in 4-6 weeks. Follow HgA1c.  2. Follow up with cardiology as directed after event monitoring complete. Consider OP TEE 3. Take medications as prescribed.  4. OP OT  Discharge Diagnoses:  Principal Problem:   Cryptogenic stroke ([place]) Active Problems:   Retinal artery occlusion   Loss of vision   Hypertension   Gout   Discharge Condition: stable  Diet recommendation: heart healthy  Filed Weights   05/25/19 1047  Weight: 99.8 kg    History of present illness:  Chad Serrano is a very pleasant 74 y.o. male,  w gout, hypogonadism, h/o melanoma, hypertension, hyperlipidemia, presented 05/25/19 with right eye vision loss for the previous 3-4 days. Pt was seen at [facility] with concern of stroke. No other stroke symptoms. No diplopia, no difficulty swallowing or speaking. No numbness or weakness of extremities.   Hospital Course:    Cryptogenic stroke. Loss of vision of right eye. MRI with No acute intracranial abnormality. Generalized atrophy without lobar predilection. CTA head and neck with No intracranial arterial occlusion or high-grade stenosis. Mild bilateral carotid bifurcation atherosclerosis and aortic atherosclerosis. HgA1c pending. Evaluated by OT and OP OT recommended. Evaluated by PT and no follow up therapies warranted.  Evaluated by neuro who recommend loop recorder which patient refused. He agreed to 30 day event monitor and cards will arrange. Neuro recommends asa + plavix for 3 weeks then just plavix. Follow up in 4-6 weeks. Continue statin.  Hypertension fair control.  Cont Losartan 100mg  po qday  Gout Cont Allopurinol 200mg  po qday  Procedures: Echo No definite intracardiac  source of emboli. Image quality suboptimal for the detection of small valvular lesions. Small, highly mobile finding in right atrium seen well only in subcostal view (clip 79) may represent venous bubble vs excessive mobility  of normal structure such as Eustatian valve or Chiari network. Cannot exclude other pathologic lesions based on these images. Consider TEE for further assessment if clinically indicated. No definite evidence of PFO. Left ventricular ejection fraction, by visual estimation, is 55 to 60%. The left ventricle has normal function. There is no left ventricular hypertrophy. Left ventricular diastolic parameters are consistent with Grade I diastolic dysfunction (impaired relaxation). Global right ventricle has normal systolic function.The right ventricular size is normal. No increase in right ventricular wall thickness.  Consultations:  [facility]ethi neurology  Barrington Ellison PA electrophysiology  Discharge Exam: Vitals:   05/26/19 0510 05/26/19 0928  BP: 140/69 (!) 154/88  Pulse: 70 76  Resp: 16 15  Temp: 98.3 F (36.8 C)   SpO2: 99% 99%    General: sitting on side of bed eating breakfast denies pain/discomfot. Reports remains unable to see right eye Cardiovascular: rrr no mgr no le edema Respiratory: normal effort BS clear bilaterally no wheeze  Discharge Instructions    Allergies as of 05/26/2019   No Known Allergies     Medication List    TAKE these medications   allopurinol 100 MG tablet Commonly known as: ZYLOPRIM TAKE 2 TABLETS BY MOUTH EVERY DAY   aspirin 81 MG tablet Take 1 tablet (81 mg total) by mouth daily for 21 days.   atorvastatin 40 MG tablet Commonly known as: LIPITOR Take 1 tablet (40 mg total) by mouth daily at 6 PM.  azelastine 0.1 % nasal spray Commonly known as: ASTELIN SPRAY 2 SPRAYS INTO EACH NOSTRIL TWICE A DAY AS DIRECTED What changed: See the new instructions.   benzonatate 200 MG capsule Commonly known as: TESSALON Take 1 capsule  (200 mg total) by mouth 3 (three) times daily as needed for cough.   Centrum Silver Adult 50+ Tabs Take 1 tablet by mouth daily.   clopidogrel 75 MG tablet Commonly known as: Plavix Take 1 tablet (75 mg total) by mouth daily.   Fish Oil 1000 MG Caps Take 1,000 mg by mouth daily.   L-Arginine 500 MG Tabs Take 500 mg by mouth daily.   losartan 100 MG tablet Commonly known as: COZAAR TAKE 1 TABLET BY MOUTH EVERY DAY   Magnesium 500 MG Caps Take 500 mg by mouth daily.   traMADol 50 MG tablet Commonly known as: ULTRAM Take 50 mg by mouth every 12 (twelve) hours as needed for moderate pain.   vitamin B-12 1000 MCG tablet Commonly known as: CYANOCOBALAMIN Take 1,000 mcg by mouth daily.   Vitamin D3 50 MCG (2000 UT) Tabs Take 2,000 Units by mouth daily.   Zinc 50 MG Tabs Take 50 mg by mouth daily.      No Known Allergies Follow-up Information    Shirley Friar, PA-C Follow up on 07/21/2019.   Specialty: Physician Assistant Why: at 1030 for post hospital follow up; discuss monitor results/loop Contact information: [address] [place] [address] [phone]            The results of significant diagnostics from this hospitalization (including imaging, microbiology, ancillary and laboratory) are listed below for reference.    Significant Diagnostic Studies: CT Angio Head W or Wo Contrast  Result Date: 05/25/2019 CLINICAL DATA:  Right eye vision loss EXAM: CT ANGIOGRAPHY HEAD AND NECK TECHNIQUE: Multidetector CT imaging of the head and neck was performed using the standard protocol during bolus administration of intravenous contrast. Multiplanar CT image reconstructions and MIPs were obtained to evaluate the vascular anatomy. Carotid stenosis measurements (when applicable) are obtained utilizing NASCET criteria, using the distal internal carotid diameter as the denominator. CONTRAST:  136mL OMNIPAQUE IOHEXOL 350 MG/ML SOLN COMPARISON:  Brain MRI  same day FINDINGS: CT HEAD FINDINGS Brain: There is no mass, hemorrhage or extra-axial collection. There is generalized atrophy without lobar predilection. There is no acute or chronic infarction. There is hypoattenuation of the periventricular white matter, most commonly indicating chronic ischemic microangiopathy. Skull: The visualized skull base, calvarium and extracranial soft tissues are normal. Sinuses/Orbits: No fluid levels or advanced mucosal thickening of the visualized paranasal sinuses. No mastoid or middle ear effusion. The orbits are normal. CTA NECK FINDINGS SKELETON: There is no bony spinal canal stenosis. No lytic or blastic lesion. OTHER NECK: Normal pharynx, larynx and major salivary glands. No cervical lymphadenopathy. Unremarkable thyroid gland. UPPER CHEST: No pneumothorax or pleural effusion. No nodules or masses. AORTIC ARCH: There is mild calcific atherosclerosis of the aortic arch. There is no aneurysm, dissection or hemodynamically significant stenosis of the visualized portion of the aorta. Conventional 3 vessel aortic branching pattern. The visualized proximal subclavian arteries are widely patent. RIGHT CAROTID SYSTEM: No dissection, occlusion or aneurysm. Mild atherosclerotic calcification at the carotid bifurcation without hemodynamically significant stenosis. LEFT CAROTID SYSTEM: No dissection, occlusion or aneurysm. Mild atherosclerotic calcification at the carotid bifurcation without hemodynamically significant stenosis. VERTEBRAL ARTERIES: Left dominant configuration. Both origins are clearly patent. There is no dissection, occlusion or flow-limiting stenosis to  the skull base (V1-V3 segments). CTA HEAD FINDINGS POSTERIOR CIRCULATION: --Vertebral arteries: Normal V4 segments. --Posterior inferior cerebellar arteries (PICA): Patent origins from the vertebral arteries. --Anterior inferior cerebellar arteries (AICA): Patent origins from the basilar artery. --Basilar artery: Normal.  --Superior cerebellar arteries: Normal. --Posterior cerebral arteries: Normal. Both are predominantly supplied by the posterior communicating arteries (p-comm). ANTERIOR CIRCULATION: --Intracranial internal carotid arteries: Normal. --Anterior cerebral arteries (ACA): Normal. Both A1 segments are present. Patent anterior communicating artery (a-comm). --Middle cerebral arteries (MCA): Normal. VENOUS SINUSES: As permitted by contrast timing, patent. ANATOMIC VARIANTS: None Review of the MIP images confirms the above findings. IMPRESSION: 1. No intracranial arterial occlusion or high-grade stenosis. 2. Mild bilateral carotid bifurcation atherosclerosis and aortic atherosclerosis (ICD10-I70.0). Electronically Signed   By: Ulyses Jarred M.D.   On: 05/25/2019 22:51   DG Chest 2 View  Result Date: 05/25/2019 CLINICAL DATA:  Retinal artery occlusion EXAM: CHEST - 2 VIEW COMPARISON:  09/15/2018 FINDINGS: The heart size and mediastinal contours are within normal limits. Both lungs are hyperinflated. The visualized skeletal structures are unremarkable. IMPRESSION: COPD without acute abnormality. Electronically Signed   By: Inez Catalina M.D.   On: 05/25/2019 23:07   CT Angio Neck W and/or Wo Contrast  Result Date: 05/25/2019 CLINICAL DATA:  Right eye vision loss EXAM: CT ANGIOGRAPHY HEAD AND NECK TECHNIQUE: Multidetector CT imaging of the head and neck was performed using the standard protocol during bolus administration of intravenous contrast. Multiplanar CT image reconstructions and MIPs were obtained to evaluate the vascular anatomy. Carotid stenosis measurements (when applicable) are obtained utilizing NASCET criteria, using the distal internal carotid diameter as the denominator. CONTRAST:  116mL OMNIPAQUE IOHEXOL 350 MG/ML SOLN COMPARISON:  Brain MRI same day FINDINGS: CT HEAD FINDINGS Brain: There is no mass, hemorrhage or extra-axial collection. There is generalized atrophy without lobar predilection. There is  no acute or chronic infarction. There is hypoattenuation of the periventricular white matter, most commonly indicating chronic ischemic microangiopathy. Skull: The visualized skull base, calvarium and extracranial soft tissues are normal. Sinuses/Orbits: No fluid levels or advanced mucosal thickening of the visualized paranasal sinuses. No mastoid or middle ear effusion. The orbits are normal. CTA NECK FINDINGS SKELETON: There is no bony spinal canal stenosis. No lytic or blastic lesion. OTHER NECK: Normal pharynx, larynx and major salivary glands. No cervical lymphadenopathy. Unremarkable thyroid gland. UPPER CHEST: No pneumothorax or pleural effusion. No nodules or masses. AORTIC ARCH: There is mild calcific atherosclerosis of the aortic arch. There is no aneurysm, dissection or hemodynamically significant stenosis of the visualized portion of the aorta. Conventional 3 vessel aortic branching pattern. The visualized proximal subclavian arteries are widely patent. RIGHT CAROTID SYSTEM: No dissection, occlusion or aneurysm. Mild atherosclerotic calcification at the carotid bifurcation without hemodynamically significant stenosis. LEFT CAROTID SYSTEM: No dissection, occlusion or aneurysm. Mild atherosclerotic calcification at the carotid bifurcation without hemodynamically significant stenosis. VERTEBRAL ARTERIES: Left dominant configuration. Both origins are clearly patent. There is no dissection, occlusion or flow-limiting stenosis to the skull base (V1-V3 segments). CTA HEAD FINDINGS POSTERIOR CIRCULATION: --Vertebral arteries: Normal V4 segments. --Posterior inferior cerebellar arteries (PICA): Patent origins from the vertebral arteries. --Anterior inferior cerebellar arteries (AICA): Patent origins from the basilar artery. --Basilar artery: Normal. --Superior cerebellar arteries: Normal. --Posterior cerebral arteries: Normal. Both are predominantly supplied by the posterior communicating arteries (p-comm).  ANTERIOR CIRCULATION: --Intracranial internal carotid arteries: Normal. --Anterior cerebral arteries (ACA): Normal. Both A1 segments are present. Patent anterior communicating artery (a-comm). --Middle cerebral arteries (MCA): Normal. VENOUS  SINUSES: As permitted by contrast timing, patent. ANATOMIC VARIANTS: None Review of the MIP images confirms the above findings. IMPRESSION: 1. No intracranial arterial occlusion or high-grade stenosis. 2. Mild bilateral carotid bifurcation atherosclerosis and aortic atherosclerosis (ICD10-I70.0). Electronically Signed   By: Ulyses Jarred M.D.   On: 05/25/2019 22:51   MR BRAIN WO CONTRAST  Result Date: 05/25/2019 CLINICAL DATA:  Vision loss. Suspected right central retinal artery occlusion. Right eye vision loss for 3-4 days. EXAM: MRI HEAD WITHOUT CONTRAST TECHNIQUE: Multiplanar, multiecho pulse sequences of the brain and surrounding structures were obtained without intravenous contrast. COMPARISON:  None. FINDINGS: BRAIN: There is no acute infarct, acute hemorrhage or extra-axial collection. Mild white matter hyperintensity, most commonly due to chronic ischemic microangiopathy, though not unexpected for age. There is generalized atrophy without lobar predilection. The midline structures are normal. VASCULAR: The major intracranial arterial and venous sinus flow voids are normal. Susceptibility-sensitive sequences show no chronic microhemorrhage or superficial siderosis. SKULL AND UPPER CERVICAL SPINE: Calvarial bone marrow signal is normal. There is no skull base mass. The visualized upper cervical spine and soft tissues are normal. SINUSES/ORBITS: There are no fluid levels or advanced mucosal thickening. The mastoid air cells and middle ear cavities are free of fluid. The orbits are normal. IMPRESSION: 1. No acute intracranial abnormality. 2. Generalized atrophy without lobar predilection. Electronically Signed   By: Ulyses Jarred M.D.   On: 05/25/2019 22:04    ECHOCARDIOGRAM COMPLETE  Result Date: 05/26/2019   ECHOCARDIOGRAM REPORT   Patient Name:   Chad Serrano Date of Exam: 05/26/2019 Medical Rec #:  [id number]       Height:       71.0 in Accession #:    [id number]      Weight:       220.0 lb Date of Birth:  1945-06-07       BSA:          2.20 m Patient Age:    70 years        BP:           154/88 mmHg Patient Gender: M               HR:           76 bpm. Exam Location:  Inpatient Procedure: 2D Echo, Cardiac Doppler and Color Doppler Indications:    Stroke  History:        Patient has no prior history of Echocardiogram examinations.                 Risk Factors:Hypertension and Dyslipidemia.  Sonographer:    Roseanna Rainbow RDCS Referring Phys: [id number]  Sonographer Comments: Technically difficult study due to poor echo windows and suboptimal parasternal window. Study is Stat for loop recorder or discharge per Dr. Leonie Man. IMPRESSIONS  1. No definite intracardiac source of emboli. Image quality suboptimal for the detection of small valvular lesions. Small, highly mobile finding in right atrium seen well only in subcostal view (clip 79) may represent venous bubble vs excessive mobility  of normal structure such as Eustatian valve or Chiari network. Cannot exclude other pathologic lesions based on these images. Consider TEE for further assessment if clinically indicated. No definite evidence of PFO.  2. Left ventricular ejection fraction, by visual estimation, is 55 to 60%. The left ventricle has normal function. There is no left ventricular hypertrophy.  3. Left ventricular diastolic parameters are consistent with Grade I diastolic dysfunction (impaired relaxation).  4. Global right  ventricle has normal systolic function.The right ventricular size is normal. No increase in right ventricular wall thickness.  5. Left atrial size was normal.  6. Right atrial size was normal.  7. Mild mitral annular calcification.  8. The mitral valve is abnormal. Trivial mitral valve  regurgitation. No evidence of mitral stenosis.  9. The tricuspid valve is normal in structure. 10. The aortic valve was not well visualized. Aortic valve regurgitation is not visualized. No evidence of aortic valve sclerosis or stenosis. 11. The pulmonic valve was normal in structure. Pulmonic valve regurgitation is trivial. 12. The inferior vena cava is normal in size with greater than 50% respiratory variability, suggesting right atrial pressure of 3 mmHg. 13. TR signal is inadequate for assessing pulmonary artery systolic pressure. FINDINGS  Left Ventricle: Left ventricular ejection fraction, by visual estimation, is 55 to 60%. The left ventricle has normal function. The left ventricle is not well visualized. There is no left ventricular hypertrophy. Left ventricular diastolic parameters are consistent with Grade I diastolic dysfunction (impaired relaxation). Normal left atrial pressure. Right Ventricle: The right ventricular size is normal. No increase in right ventricular wall thickness. Global RV systolic function is has normal systolic function. Left Atrium: Left atrial size was normal in size. Right Atrium: Right atrial size was normal in size Pericardium: There is no evidence of pericardial effusion. Mitral Valve: The mitral valve is abnormal. Mild mitral annular calcification. Trivial mitral valve regurgitation. No evidence of mitral valve stenosis by observation. Tricuspid Valve: The tricuspid valve is normal in structure. Tricuspid valve regurgitation is trivial. Aortic Valve: The aortic valve was not well visualized. Aortic valve regurgitation is not visualized. The aortic valve is structurally normal, with no evidence of sclerosis or stenosis. Pulmonic Valve: The pulmonic valve was normal in structure. Pulmonic valve regurgitation is trivial. Pulmonic regurgitation is trivial. Aorta: The aortic root, ascending aorta and aortic arch are all structurally normal, with no evidence of dilitation or  obstruction. Venous: The inferior vena cava is normal in size with greater than 50% respiratory variability, suggesting right atrial pressure of 3 mmHg. IAS/Shunts: No atrial level shunt detected by color flow Doppler. There is no evidence of a patent foramen ovale. No ventricular septal defect is seen or detected. There is no evidence of an atrial septal defect.  LEFT VENTRICLE PLAX 2D LVIDd:         3.40 cm       Diastology LVIDs:         2.30 cm       LV e' lateral:   8.92 cm/s LV PW:         1.70 cm       LV E/e' lateral: 6.6 LV IVS:        1.40 cm       LV e' medial:    8.49 cm/s LVOT diam:     2.00 cm       LV E/e' medial:  6.9 LV SV:         29 ml LV SV Index:   12.96 LVOT Area:     3.14 cm  LV Volumes (MOD) LV area d, A2C:    27.40 cm LV area d, A4C:    29.40 cm LV area s, A2C:    15.00 cm LV area s, A4C:    17.90 cm LV major d, A2C:   8.04 cm LV major d, A4C:   8.40 cm LV major s, A2C:   6.66 cm LV major s,  A4C:   7.30 cm LV vol d, MOD A2C: 76.2 ml LV vol d, MOD A4C: 84.3 ml LV vol s, MOD A2C: 29.2 ml LV vol s, MOD A4C: 36.9 ml LV SV MOD A2C:     47.0 ml LV SV MOD A4C:     84.3 ml LV SV MOD BP:      47.7 ml RIGHT VENTRICLE             IVC RV S prime:     13.10 cm/s  IVC diam: 1.90 cm TAPSE (M-mode): 2.4 cm LEFT ATRIUM           Index       RIGHT ATRIUM           Index LA diam:      3.50 cm 1.59 cm/m  RA Area:     13.30 cm LA Vol (A2C): 22.5 ml 10.25 ml/m RA Volume:   29.50 ml  13.44 ml/m LA Vol (A4C): 31.2 ml 14.21 ml/m  AORTIC VALVE LVOT Vmax:   88.80 cm/s LVOT Vmean:  66.100 cm/s LVOT VTI:    0.203 m  AORTA Ao Root diam: 3.60 cm Ao Asc diam:  3.10 cm MITRAL VALVE MV Area (PHT): 3.08 cm             SHUNTS MV PHT:        71.34 msec           Systemic VTI:  0.20 m MV Decel Time: 246 msec             Systemic Diam: 2.00 cm MV E velocity: 58.70 cm/s 103 cm/s MV A velocity: 69.40 cm/s 70.3 cm/s MV E/A ratio:  0.85       1.5  Cherlynn Kaiser MD Electronically signed by Cherlynn Kaiser MD Signature  Date/Time: 05/26/2019/1:29:21 PM    Final     Microbiology: Recent Results (from the past 240 hour(s))  SARS CORONAVIRUS 2 (TAT 6-24 HRS) Nasopharyngeal Nasopharyngeal Swab     Status: None   Collection Time: 05/26/19 12:04 AM   Specimen: Nasopharyngeal Swab  Result Value Ref Range Status   SARS Coronavirus 2 NEGATIVE NEGATIVE Final    Comment: (NOTE) SARS-CoV-2 target nucleic acids are NOT DETECTED. The SARS-CoV-2 RNA is generally detectable in upper and lower respiratory specimens during the acute phase of infection. Negative results do not preclude SARS-CoV-2 infection, do not rule out co-infections with other pathogens, and should not be used as the sole basis for treatment or other patient management decisions. Negative results must be combined with clinical observations, patient history, and epidemiological information. The expected result is Negative. Fact Sheet for Patients: SugarRoll.be Fact Sheet for Healthcare Providers: https://www.woods-mathews.com/ This test is not yet approved or cleared by the [place] FDA and  has been authorized for detection and/or diagnosis of SARS-CoV-2 by FDA under an Emergency Use Authorization (EUA). This EUA will remain  in effect (meaning this test can be used) for the duration of the COVID-19 declaration under Section 56 4(b)(1) of the Act, 21 U.S.C. section 360bbb-3(b)(1), unless the authorization is terminated or revoked sooner. Performed at [facility], [place] [address] [place], [place] [address]      Labs: Basic Metabolic Panel: Recent Labs  Lab 05/25/19 1049 05/26/19 0625  NA 139 137  K 4.2 4.2  CL 104 104  CO2 26 22  GLUCOSE 98 92  BUN 18 20  CREATININE 1.26* 1.10  CALCIUM 9.2 8.6*   Liver Function Tests:  Recent Labs  Lab 05/25/19 1049 05/26/19 0625  AST 26 21  ALT 31 25  ALKPHOS 66 62  BILITOT 1.3* 1.2  PROT 6.7 5.9*  ALBUMIN 3.7 3.3*   No results for  input(s): LIPASE, AMYLASE in the last 168 hours. No results for input(s): AMMONIA in the last 168 hours. CBC: Recent Labs  Lab 05/25/19 1049  WBC 10.7*  NEUTROABS 5.6  HGB 15.8  HCT 47.2  MCV 98.3  PLT 205   Cardiac Enzymes: No results for input(s): CKTOTAL, CKMB, CKMBINDEX, TROPONINI in the last 168 hours. BNP: BNP (last 3 results) Recent Labs    09/15/18 1013  BNP 10    ProBNP (last 3 results) No results for input(s): PROBNP in the last 8760 hours.  CBG: No results for input(s): GLUCAP in the last 168 hours.     SignedRadene Gunning NP  [facility] 05/26/2019, 1:45 PM

## 2019-05-26 NOTE — ED Notes (Signed)
Esignature not available. Pt received d/c instructions and was informed about prescriptions. Pt understands all instructions. Pt's wife will pick him up from the ED.

## 2019-05-26 NOTE — Progress Notes (Signed)
STROKE TEAM PROGRESS NOTE   INTERVAL HISTORY I personally reviewed history of presenting illness, electronic medical records and imaging films in PACS.  He presented with sudden onset of painless near complete vision loss in the right eye on New Year's Eve.  This has persisted.  He can see only in a small crescent in the superior temporal quadrant in the right eye.  He has been evaluated by his ophthalmologist and diagnosed with central retinal artery occlusion.  MRI scan of the brain has been performed and shows no acute infarct.  CT angiogram shows only mild atheromatous changes without significant narrowing of extracranial intracranial carotids.  2D echo is pending.  Vitals:   05/25/19 1621 05/25/19 2314 05/26/19 0510 05/26/19 0928  BP: (!) 154/84 (!) 150/88 140/69 (!) 154/88  Pulse: 72 70 70 76  Resp: 18 18 16 15   Temp:   98.3 F (36.8 C)   TempSrc:   Oral   SpO2: 100% 97% 99% 99%  Weight:      Height:        CBC:  Recent Labs  Lab 05/25/19 1049  WBC 10.7*  NEUTROABS 5.6  HGB 15.8  HCT 47.2  MCV 98.3  PLT 99991111    Basic Metabolic Panel:  Recent Labs  Lab 05/25/19 1049 05/26/19 0625  NA 139 137  K 4.2 4.2  CL 104 104  CO2 26 22  GLUCOSE 98 92  BUN 18 20  CREATININE 1.26* 1.10  CALCIUM 9.2 8.6*   Lipid Panel:     Component Value Date/Time   CHOL 193 05/26/2019 0624   TRIG 89 05/26/2019 0624   HDL 40 (L) 05/26/2019 0624   CHOLHDL 4.8 05/26/2019 0624   VLDL 18 05/26/2019 0624   LDLCALC 135 (H) 05/26/2019 0624   LDLCALC 125 (H) 07/23/2017 0803   HgbA1c:  Lab Results  Component Value Date   HGBA1C 5.8 (H) 05/05/2015   Urine Drug Screen: No results found for: LABOPIA, COCAINSCRNUR, LABBENZ, AMPHETMU, THCU, LABBARB  Alcohol Level No results found for: ETH  IMAGING past 48 hours CT Angio Head W or Wo Contrast  Result Date: 05/25/2019 CLINICAL DATA:  Right eye vision loss EXAM: CT ANGIOGRAPHY HEAD AND NECK TECHNIQUE: Multidetector CT imaging of the head and  neck was performed using the standard protocol during bolus administration of intravenous contrast. Multiplanar CT image reconstructions and MIPs were obtained to evaluate the vascular anatomy. Carotid stenosis measurements (when applicable) are obtained utilizing NASCET criteria, using the distal internal carotid diameter as the denominator. CONTRAST:  110mL OMNIPAQUE IOHEXOL 350 MG/ML SOLN COMPARISON:  Brain MRI same day FINDINGS: CT HEAD FINDINGS Brain: There is no mass, hemorrhage or extra-axial collection. There is generalized atrophy without lobar predilection. There is no acute or chronic infarction. There is hypoattenuation of the periventricular white matter, most commonly indicating chronic ischemic microangiopathy. Skull: The visualized skull base, calvarium and extracranial soft tissues are normal. Sinuses/Orbits: No fluid levels or advanced mucosal thickening of the visualized paranasal sinuses. No mastoid or middle ear effusion. The orbits are normal. CTA NECK FINDINGS SKELETON: There is no bony spinal canal stenosis. No lytic or blastic lesion. OTHER NECK: Normal pharynx, larynx and major salivary glands. No cervical lymphadenopathy. Unremarkable thyroid gland. UPPER CHEST: No pneumothorax or pleural effusion. No nodules or masses. AORTIC ARCH: There is mild calcific atherosclerosis of the aortic arch. There is no aneurysm, dissection or hemodynamically significant stenosis of the visualized portion of the aorta. Conventional 3 vessel aortic branching pattern. The  visualized proximal subclavian arteries are widely patent. RIGHT CAROTID SYSTEM: No dissection, occlusion or aneurysm. Mild atherosclerotic calcification at the carotid bifurcation without hemodynamically significant stenosis. LEFT CAROTID SYSTEM: No dissection, occlusion or aneurysm. Mild atherosclerotic calcification at the carotid bifurcation without hemodynamically significant stenosis. VERTEBRAL ARTERIES: Left dominant configuration.  Both origins are clearly patent. There is no dissection, occlusion or flow-limiting stenosis to the skull base (V1-V3 segments). CTA HEAD FINDINGS POSTERIOR CIRCULATION: --Vertebral arteries: Normal V4 segments. --Posterior inferior cerebellar arteries (PICA): Patent origins from the vertebral arteries. --Anterior inferior cerebellar arteries (AICA): Patent origins from the basilar artery. --Basilar artery: Normal. --Superior cerebellar arteries: Normal. --Posterior cerebral arteries: Normal. Both are predominantly supplied by the posterior communicating arteries (p-comm). ANTERIOR CIRCULATION: --Intracranial internal carotid arteries: Normal. --Anterior cerebral arteries (ACA): Normal. Both A1 segments are present. Patent anterior communicating artery (a-comm). --Middle cerebral arteries (MCA): Normal. VENOUS SINUSES: As permitted by contrast timing, patent. ANATOMIC VARIANTS: None Review of the MIP images confirms the above findings. IMPRESSION: 1. No intracranial arterial occlusion or high-grade stenosis. 2. Mild bilateral carotid bifurcation atherosclerosis and aortic atherosclerosis (ICD10-I70.0). Electronically Signed   By: Ulyses Jarred M.D.   On: 05/25/2019 22:51   DG Chest 2 View  Result Date: 05/25/2019 CLINICAL DATA:  Retinal artery occlusion EXAM: CHEST - 2 VIEW COMPARISON:  09/15/2018 FINDINGS: The heart size and mediastinal contours are within normal limits. Both lungs are hyperinflated. The visualized skeletal structures are unremarkable. IMPRESSION: COPD without acute abnormality. Electronically Signed   By: Inez Catalina M.D.   On: 05/25/2019 23:07   CT Angio Neck W and/or Wo Contrast  Result Date: 05/25/2019 CLINICAL DATA:  Right eye vision loss EXAM: CT ANGIOGRAPHY HEAD AND NECK TECHNIQUE: Multidetector CT imaging of the head and neck was performed using the standard protocol during bolus administration of intravenous contrast. Multiplanar CT image reconstructions and MIPs were obtained to  evaluate the vascular anatomy. Carotid stenosis measurements (when applicable) are obtained utilizing NASCET criteria, using the distal internal carotid diameter as the denominator. CONTRAST:  137mL OMNIPAQUE IOHEXOL 350 MG/ML SOLN COMPARISON:  Brain MRI same day FINDINGS: CT HEAD FINDINGS Brain: There is no mass, hemorrhage or extra-axial collection. There is generalized atrophy without lobar predilection. There is no acute or chronic infarction. There is hypoattenuation of the periventricular white matter, most commonly indicating chronic ischemic microangiopathy. Skull: The visualized skull base, calvarium and extracranial soft tissues are normal. Sinuses/Orbits: No fluid levels or advanced mucosal thickening of the visualized paranasal sinuses. No mastoid or middle ear effusion. The orbits are normal. CTA NECK FINDINGS SKELETON: There is no bony spinal canal stenosis. No lytic or blastic lesion. OTHER NECK: Normal pharynx, larynx and major salivary glands. No cervical lymphadenopathy. Unremarkable thyroid gland. UPPER CHEST: No pneumothorax or pleural effusion. No nodules or masses. AORTIC ARCH: There is mild calcific atherosclerosis of the aortic arch. There is no aneurysm, dissection or hemodynamically significant stenosis of the visualized portion of the aorta. Conventional 3 vessel aortic branching pattern. The visualized proximal subclavian arteries are widely patent. RIGHT CAROTID SYSTEM: No dissection, occlusion or aneurysm. Mild atherosclerotic calcification at the carotid bifurcation without hemodynamically significant stenosis. LEFT CAROTID SYSTEM: No dissection, occlusion or aneurysm. Mild atherosclerotic calcification at the carotid bifurcation without hemodynamically significant stenosis. VERTEBRAL ARTERIES: Left dominant configuration. Both origins are clearly patent. There is no dissection, occlusion or flow-limiting stenosis to the skull base (V1-V3 segments). CTA HEAD FINDINGS POSTERIOR  CIRCULATION: --Vertebral arteries: Normal V4 segments. --Posterior inferior cerebellar arteries (PICA): Patent origins  from the vertebral arteries. --Anterior inferior cerebellar arteries (AICA): Patent origins from the basilar artery. --Basilar artery: Normal. --Superior cerebellar arteries: Normal. --Posterior cerebral arteries: Normal. Both are predominantly supplied by the posterior communicating arteries (p-comm). ANTERIOR CIRCULATION: --Intracranial internal carotid arteries: Normal. --Anterior cerebral arteries (ACA): Normal. Both A1 segments are present. Patent anterior communicating artery (a-comm). --Middle cerebral arteries (MCA): Normal. VENOUS SINUSES: As permitted by contrast timing, patent. ANATOMIC VARIANTS: None Review of the MIP images confirms the above findings. IMPRESSION: 1. No intracranial arterial occlusion or high-grade stenosis. 2. Mild bilateral carotid bifurcation atherosclerosis and aortic atherosclerosis (ICD10-I70.0). Electronically Signed   By: Ulyses Jarred M.D.   On: 05/25/2019 22:51   MR BRAIN WO CONTRAST  Result Date: 05/25/2019 CLINICAL DATA:  Vision loss. Suspected right central retinal artery occlusion. Right eye vision loss for 3-4 days. EXAM: MRI HEAD WITHOUT CONTRAST TECHNIQUE: Multiplanar, multiecho pulse sequences of the brain and surrounding structures were obtained without intravenous contrast. COMPARISON:  None. FINDINGS: BRAIN: There is no acute infarct, acute hemorrhage or extra-axial collection. Mild white matter hyperintensity, most commonly due to chronic ischemic microangiopathy, though not unexpected for age. There is generalized atrophy without lobar predilection. The midline structures are normal. VASCULAR: The major intracranial arterial and venous sinus flow voids are normal. Susceptibility-sensitive sequences show no chronic microhemorrhage or superficial siderosis. SKULL AND UPPER CERVICAL SPINE: Calvarial bone marrow signal is normal. There is no skull  base mass. The visualized upper cervical spine and soft tissues are normal. SINUSES/ORBITS: There are no fluid levels or advanced mucosal thickening. The mastoid air cells and middle ear cavities are free of fluid. The orbits are normal. IMPRESSION: 1. No acute intracranial abnormality. 2. Generalized atrophy without lobar predilection. Electronically Signed   By: Ulyses Jarred M.D.   On: 05/25/2019 22:04   ECHOCARDIOGRAM COMPLETE  Result Date: 05/26/2019   ECHOCARDIOGRAM REPORT   Patient Name:   JAMIE BALLIETT Date of Exam: 05/26/2019 Medical Rec #:  [id number]       Height:       71.0 in Accession #:    [id number]      Weight:       220.0 lb Date of Birth:  07/07/1945       BSA:          2.20 m Patient Age:    35 years        BP:           154/88 mmHg Patient Gender: M               HR:           76 bpm. Exam Location:  Inpatient Procedure: 2D Echo, Cardiac Doppler and Color Doppler Indications:    Stroke  History:        Patient has no prior history of Echocardiogram examinations.                 Risk Factors:Hypertension and Dyslipidemia.  Sonographer:    Roseanna Rainbow RDCS Referring Phys: [id number]  Sonographer Comments: Technically difficult study due to poor echo windows and suboptimal parasternal window. Study is Stat for loop recorder or discharge per Dr. Leonie Man. IMPRESSIONS  1. No definite intracardiac source of emboli. Image quality suboptimal for the detection of small valvular lesions. Small, highly mobile finding in right atrium seen well only in subcostal view (clip 79) may represent venous bubble vs excessive mobility  of normal structure such as Eustatian valve or Chiari network. Cannot exclude  other pathologic lesions based on these images. Consider TEE for further assessment if clinically indicated. No definite evidence of PFO.  2. Left ventricular ejection fraction, by visual estimation, is 55 to 60%. The left ventricle has normal function. There is no left ventricular hypertrophy.  3. Left  ventricular diastolic parameters are consistent with Grade I diastolic dysfunction (impaired relaxation).  4. Global right ventricle has normal systolic function.The right ventricular size is normal. No increase in right ventricular wall thickness.  5. Left atrial size was normal.  6. Right atrial size was normal.  7. Mild mitral annular calcification.  8. The mitral valve is abnormal. Trivial mitral valve regurgitation. No evidence of mitral stenosis.  9. The tricuspid valve is normal in structure. 10. The aortic valve was not well visualized. Aortic valve regurgitation is not visualized. No evidence of aortic valve sclerosis or stenosis. 11. The pulmonic valve was normal in structure. Pulmonic valve regurgitation is trivial. 12. The inferior vena cava is normal in size with greater than 50% respiratory variability, suggesting right atrial pressure of 3 mmHg. 13. TR signal is inadequate for assessing pulmonary artery systolic pressure. FINDINGS  Left Ventricle: Left ventricular ejection fraction, by visual estimation, is 55 to 60%. The left ventricle has normal function. The left ventricle is not well visualized. There is no left ventricular hypertrophy. Left ventricular diastolic parameters are consistent with Grade I diastolic dysfunction (impaired relaxation). Normal left atrial pressure. Right Ventricle: The right ventricular size is normal. No increase in right ventricular wall thickness. Global RV systolic function is has normal systolic function. Left Atrium: Left atrial size was normal in size. Right Atrium: Right atrial size was normal in size Pericardium: There is no evidence of pericardial effusion. Mitral Valve: The mitral valve is abnormal. Mild mitral annular calcification. Trivial mitral valve regurgitation. No evidence of mitral valve stenosis by observation. Tricuspid Valve: The tricuspid valve is normal in structure. Tricuspid valve regurgitation is trivial. Aortic Valve: The aortic valve was not  well visualized. Aortic valve regurgitation is not visualized. The aortic valve is structurally normal, with no evidence of sclerosis or stenosis. Pulmonic Valve: The pulmonic valve was normal in structure. Pulmonic valve regurgitation is trivial. Pulmonic regurgitation is trivial. Aorta: The aortic root, ascending aorta and aortic arch are all structurally normal, with no evidence of dilitation or obstruction. Venous: The inferior vena cava is normal in size with greater than 50% respiratory variability, suggesting right atrial pressure of 3 mmHg. IAS/Shunts: No atrial level shunt detected by color flow Doppler. There is no evidence of a patent foramen ovale. No ventricular septal defect is seen or detected. There is no evidence of an atrial septal defect.  LEFT VENTRICLE PLAX 2D LVIDd:         3.40 cm       Diastology LVIDs:         2.30 cm       LV e' lateral:   8.92 cm/s LV PW:         1.70 cm       LV E/e' lateral: 6.6 LV IVS:        1.40 cm       LV e' medial:    8.49 cm/s LVOT diam:     2.00 cm       LV E/e' medial:  6.9 LV SV:         29 ml LV SV Index:   12.96 LVOT Area:     3.14 cm  LV Volumes (  MOD) LV area d, A2C:    27.40 cm LV area d, A4C:    29.40 cm LV area s, A2C:    15.00 cm LV area s, A4C:    17.90 cm LV major d, A2C:   8.04 cm LV major d, A4C:   8.40 cm LV major s, A2C:   6.66 cm LV major s, A4C:   7.30 cm LV vol d, MOD A2C: 76.2 ml LV vol d, MOD A4C: 84.3 ml LV vol s, MOD A2C: 29.2 ml LV vol s, MOD A4C: 36.9 ml LV SV MOD A2C:     47.0 ml LV SV MOD A4C:     84.3 ml LV SV MOD BP:      47.7 ml RIGHT VENTRICLE             IVC RV S prime:     13.10 cm/s  IVC diam: 1.90 cm TAPSE (M-mode): 2.4 cm LEFT ATRIUM           Index       RIGHT ATRIUM           Index LA diam:      3.50 cm 1.59 cm/m  RA Area:     13.30 cm LA Vol (A2C): 22.5 ml 10.25 ml/m RA Volume:   29.50 ml  13.44 ml/m LA Vol (A4C): 31.2 ml 14.21 ml/m  AORTIC VALVE LVOT Vmax:   88.80 cm/s LVOT Vmean:  66.100 cm/s LVOT VTI:    0.203 m   AORTA Ao Root diam: 3.60 cm Ao Asc diam:  3.10 cm MITRAL VALVE MV Area (PHT): 3.08 cm             SHUNTS MV PHT:        71.34 msec           Systemic VTI:  0.20 m MV Decel Time: 246 msec             Systemic Diam: 2.00 cm MV E velocity: 58.70 cm/s 103 cm/s MV A velocity: 69.40 cm/s 70.3 cm/s MV E/A ratio:  0.85       1.5  Cherlynn Kaiser MD Electronically signed by Cherlynn Kaiser MD Signature Date/Time: 05/26/2019/1:29:21 PM    Final     PHYSICAL EXAM Pleasant mildly obese elderly Caucasian male not in distress. . Afebrile. Head is nontraumatic. Neck is supple without bruit.    Cardiac exam no murmur or gallop. Lungs are clear to auscultation. Distal pulses are well felt. Neurological Exam ;  Awake  Alert oriented x 3. Normal speech and language.eye movements full without nystagmus.fundi were not visualized. Vision acuity and fields appear normal in left eye but in the right eye vision acuity is significantly diminished and patient can see only in a small crescentic portion in the superior and lateral aspect of the right eye.  Left pupil is reactive right is sluggishly reactive.Marland Kitchen Hearing is normal. Palatal movements are normal. Face symmetric. Tongue midline. Normal strength, tone, reflexes and coordination. Normal sensation. Gait deferred.  ASSESSMENT/PLAN [name] is a 74 y.o. male with history of HTN, HLD, Melanoma, CKD, gout presenting with sudden loss of vision in his R eye.   R CRAO  MRI  No acute abnormality. Generalized atrophy.   CTA head & neck mild atherosclerosis ICA. Aortic atherosclerosis.   2D Echo no definite SOE, possible small highly mobile finding in RA only in subcostal veiw ? Bubble vs excessive mobility of normal structure  EP consulted for OP event monitoring  to look for AF as source of stroke, if negative, consider ILR placement. Pt refused ILR in hospital.   LDL 135  HgbA1c pending   Lovenox 40 mg sq daily for VTE prophylaxis  aspirin 81 mg daily prior  to admission, now on aspirin 325 mg daily. Continue aspirin 81 and Plavix 75 mg daily at d/c for 3 weeks followed by Plavix 75 mg alone  Therapy recommendations:  OP OT  Disposition:  Return home  Hypertension  Stable . BP goal normotensive  Hyperlipidemia  Home meds:  Fish oil  Now on lipitor 40  LDL 135, goal < 70  Continue statin at discharge  Other Stroke Risk Factors  Advanced age  Obesity, Body mass index is 30.68 kg/m., recommend weight loss, diet and exercise as appropriate   Other Active Problems  Gout  Hospital day # 0  I have personally obtained history,examined this patient, reviewed notes, independently viewed imaging studies, participated in medical decision making and plan of care.ROS completed by me personally and pertinent positives fully documented  I have made any additions or clarifications directly to the above note.  He presented with painless near complete vision loss in the right eye secondary to central retinal artery occlusion he does have atherosclerotic risk factor factors.  I recommend further evaluation by checking echocardiogram and prolonged cardiac monitoring for paroxysmal A. fib.  Patient was given option to consider loop recorder placement versus 30-day external heart monitor.  Recommend aspirin and Plavix for 3 weeks followed by Plavix alone.  Follow-up as an outpatient in the stroke clinic in 6 weeks.  Greater than 50% time during this 35-minute visit was spent on counseling and coordination of care and discussion with Dr. Eliseo Squires and electrophysiology team and answering questions. Antony Contras, MD Medical Director [place] Pager: [phone] 05/26/2019 4:42 PM   To contact Stroke Continuity provider, please refer to [email or web address] After hours, contact General Neurology

## 2019-05-26 NOTE — Evaluation (Signed)
Physical Therapy Evaluation - One time eval  Patient Details Name: Chad Serrano MRN: [id number] DOB: 03/31/1946 Today's Date: 05/26/2019   History of Present Illness  Pt is a 74 y/o male with PMH of gout, hypogonadism, melanoma, HTN, presenting with R eye vision loss for the past 3-4 days. Was seen at [facility] with dx of R retinal artery occlusion, referred him to [facility] for further work-up for embolic stroke.  MRI negative.   Clinical Impression   Pt presents with [facility] strength, mobility, and balance upon PT evaluation. Pt ambulated hallway distance and accepted gait challenges without difficulty. Pt's main limitation at this time is decreased vision of R eye, which pt describes as lacking interior 45* of R visual field. Pt educated on visual scanning to R to avoid obstacles in home/community and to decrease risk of falls, pt also encouraged to hold on driving until vision improves and/or pt goes to OP OT to address compensatory techniques given vision loss.  No acute or post-acute PT needs at this time, PT to sign off and pt to d/c home today in the care of his wife.     Follow Up Recommendations No PT follow up;Supervision for mobility/OOB    Equipment Recommendations  None recommended by PT    Recommendations for Other Services       Precautions / Restrictions Precautions Precautions: None Restrictions Weight Bearing Restrictions: No      Mobility  Bed Mobility Overal bed mobility: Independent                Transfers Overall transfer level: Independent                  Ambulation/Gait Ambulation/Gait assistance: Modified independent (Device/Increase time) Gait Distance (Feet): 130 Feet Assistive device: None Gait Pattern/deviations: Step-through pattern;WFL(Within Functional Limits) Gait velocity: WFL   General Gait Details: No physical assist required for steadying, guiding pt. Mod I for increased time for visual scanning to R, PT  with verbal cuing and prompting for R scanning of environment given R vision loss.  Stairs Stairs: (No stairs closeby, pt able to march in place without support with no unsteadiness or LOB noted)          Wheelchair Mobility    Modified Rankin (Stroke Patients Only)       Balance Overall balance assessment: Independent                           High level balance activites: Direction changes;Head turns High Level Balance Comments: Increased time to turn head to R and visualize signs, but WFL balance during turns, head turns, fast/slow ambulation             Pertinent Vitals/Pain Pain Assessment: No/denies pain    Home Living Family/patient expects to be discharged to:: Private residence Living Arrangements: Spouse/significant other Available Help at Discharge: Family;Available 24 hours/day Type of Home: House Home Access: Stairs to enter   [employer] of Steps: a couple Home Layout: Two level;Able to live on main level with bedroom/bathroom Home Equipment: None      Prior Function Level of Independence: Independent         Comments: driving, reports like doing everything for self     Hand Dominance   Dominant Hand: Right    Extremity/Trunk Assessment   Upper Extremity Assessment Upper Extremity Assessment: Defer to OT evaluation    Lower Extremity Assessment Lower Extremity Assessment: Overall [facility]  for tasks assessed    Cervical / Trunk Assessment Cervical / Trunk Assessment: Normal  Communication   Communication: No difficulties  Cognition Arousal/Alertness: Awake/alert Behavior During Therapy: WFL for tasks assessed/performed Overall Cognitive Status: Within Functional Limits for tasks assessed                                        General Comments General comments (skin integrity, edema, etc.): PT/OT saw pt back to back, both educated pt on s/s of CVA, visual scanning to R given vision loss, and  avoiding driving until OP OT for compensatory techniques or until cleared to drive    Exercises     Assessment/Plan    PT Assessment Patent does not need any further PT services  PT Problem List Decreased mobility;Decreased safety awareness       PT Treatment Interventions (n/a)    PT Goals (Current goals can be found in the Care Plan section)  Acute Rehab PT Goals Patient Stated Goal: to get home  PT Goal Formulation: With patient Time For Goal Achievement: 05/26/19 Potential to Achieve Goals: Good    Frequency (n/a)   Barriers to discharge        Co-evaluation               AM-PAC PT "6 Clicks" Mobility  Outcome Measure Help needed turning from your back to your side while in a flat bed without using bedrails?: None Help needed moving from lying on your back to sitting on the side of a flat bed without using bedrails?: None Help needed moving to and from a bed to a chair (including a wheelchair)?: None Help needed standing up from a chair using your arms (e.g., wheelchair or bedside chair)?: None Help needed to walk in hospital room?: None Help needed climbing 3-5 steps with a railing? : None 6 Click Score: 24    End of Session Equipment Utilized During Treatment: Gait belt Activity Tolerance: Patient tolerated treatment well Patient left: in bed Nurse Communication: Other (comment)(unable to locate RN post-session; secure chat to PA regarding pt mobility and post-acute needs for PT/OT) PT Visit Diagnosis: Other symptoms and signs involving the nervous system (R29.898)    Time: [id number] PT Time Calculation (min) (ACUTE ONLY): 9 min   Charges:   PT Evaluation $PT Eval Low Complexity: 1 Low         Joane Postel E, PT Acute Rehabilitation Services Pager [phone]  Office [phone]   Lanice Folden D Elonda Husky 05/26/2019, 2:31 PM

## 2019-05-27 ENCOUNTER — Telehealth: Payer: Self-pay

## 2019-05-27 LAB — HEMOGLOBIN A1C
Hgb A1c MFr Bld: 5.6 % (ref 4.8–5.6)
Mean Plasma Glucose: 114 mg/dL

## 2019-05-27 NOTE — Telephone Encounter (Signed)
Pt called stating that she is still feeling a little under the weather and would like another round of antibiotics. Please advise.

## 2019-05-28 ENCOUNTER — Other Ambulatory Visit: Payer: Self-pay

## 2019-05-28 ENCOUNTER — Ambulatory Visit (INDEPENDENT_AMBULATORY_CARE_PROVIDER_SITE_OTHER): Payer: Medicare Other | Admitting: Family Medicine

## 2019-05-28 ENCOUNTER — Telehealth: Payer: Self-pay | Admitting: Radiology

## 2019-05-28 ENCOUNTER — Encounter: Payer: Self-pay | Admitting: Family Medicine

## 2019-05-28 VITALS — BP 136/80 | HR 80 | Temp 96.8°F | Resp 16 | Ht 70.5 in | Wt 218.0 lb

## 2019-05-28 DIAGNOSIS — H547 Unspecified visual loss: Secondary | ICD-10-CM

## 2019-05-28 DIAGNOSIS — I10 Essential (primary) hypertension: Secondary | ICD-10-CM | POA: Diagnosis not present

## 2019-05-28 DIAGNOSIS — I639 Cerebral infarction, unspecified: Secondary | ICD-10-CM

## 2019-05-28 DIAGNOSIS — E7800 Pure hypercholesterolemia, unspecified: Secondary | ICD-10-CM | POA: Diagnosis not present

## 2019-05-28 NOTE — Telephone Encounter (Signed)
Will discuss at ov today.  In future please get specific symptoms as it helps me determine what is going on.  Also please let patients know if I am out of the office and will not get the message until the following day as it helps avoid upset patients.

## 2019-05-28 NOTE — Telephone Encounter (Signed)
Enrolled patient for a 30 day [employer] Event monitor to be mailed to patients home. Brief instructions were gone over with the patient and he knows to expect the monitor to arrive in 5-7 days.

## 2019-05-29 ENCOUNTER — Encounter: Payer: Self-pay | Admitting: Family Medicine

## 2019-05-29 DIAGNOSIS — I639 Cerebral infarction, unspecified: Secondary | ICD-10-CM | POA: Insufficient documentation

## 2019-05-29 NOTE — Progress Notes (Signed)
Subjective:    Patient ID: Chad Serrano, male    DOB: 05/13/1946, 74 y.o.   MRN: [id number]  HPI  Patient was recently admitted to the hospital with retinal artery occlusion/cryptogenic stroke.  I have copied relevant portions of the discharge summary below for my reference.  He was admitted January 4 through January 5:  History of present illness:  [name] a very pleasant73 y.o.male,w gout, hypogonadism, h/o melanoma, hypertension, hyperlipidemia, presented 05/25/19 with right eye vision loss for the previous 3-4 days. Pt was seen at [facility] with concern of stroke. No other stroke symptoms. No diplopia, no difficulty swallowing or speaking. No numbness or weakness of extremities.   Hospital Course:    Cryptogenic stroke. Loss of vision of right eye. MRI with No acute intracranial abnormality. Generalized atrophy without lobar predilection. CTA head and neck with No intracranial arterial occlusion or high-grade stenosis. Mild bilateral carotid bifurcation atherosclerosis and aortic atherosclerosis. HgA1c pending. Evaluated by OT and OP OT recommended. Evaluated by PT and no follow up therapies warranted.  Evaluated by neuro who recommend loop recorder which patient refused. He agreed to 30 day event monitor and cards will arrange. Neuro recommends asa + plavix for 3 weeks then just plavix. Follow up in 4-6 weeks. Continue statin.  Hypertension fair control.  Cont Losartan 100mg  po qday  Gout Cont Allopurinol 200mg  po qday  Procedures: Echo No definite intracardiac source of emboli. Image quality suboptimal for the detection of small valvular lesions. Small, highly mobile finding in right atrium seen well only in subcostal view (clip 79) may represent venous bubble vs excessive mobility of normal structure such as Eustatian valve or Chiari network. Cannot exclude other pathologic lesions based on these images. Consider TEE for further assessment  if clinically indicated. No definite evidence of PFO. Left ventricular ejection fraction, by visual estimation, is 55 to 60%. The left ventricle has normal function. There is no left ventricular hypertrophy. Left ventricular diastolic parameters are consistent with Grade I diastolic dysfunction (impaired relaxation). Global right ventricle has normal systolic function.The right ventricular size is normal. No increase in right ventricular wall thickness.  Consultations:  Chad Serrano neurology  Chad Ellison PA electrophysiology  Since discharge from the hospital, the patient is doing well.  He continues to experience vision loss in his right eye.  This has not improved.  He denies any bleeding or bruising since the addition of Plavix.  He was already on aspirin prior to the stroke.  He is now on dual antiplatelet therapy as recommended by neurology for 3 weeks and then will transition to Plavix alone.  Admission labs were significant for an LDL cholesterol greater than 130.  MRI of the brain showed no specific injury.  CT angiogram of the neck and head revealed mild plaque at the carotid artery bifurcation bilaterally but no significant stenosis.  Question as to whether this is embolic stroke due to undiagnosed atrial fibrillation or ischemic stroke due to plaque.  Patient has follow-up appointment with cardiology to have a 1 month event monitor placed.  This is still pending.  He denies any palpitations or tachycardia.  He denies any shortness of breath or chest pain.  He is tolerating the statins well.  He is somewhat anxious.  In the last year I have seen the patient for pneumonia that was difficult to resolve.  He was then diagnosed with melanoma which was treated at [place].  Now he has developed a retinal artery occlusion.  However he  has done well excepting all this and seems to be moving on from all of these medical incidents.  He denies any depression today.  Spent more than 30 minutes today with the  patient reviewing his history and explaining the test and their implications.  Past Medical History:  Diagnosis Date  . Anxiety   . Chronic kidney disease   . Gout   . Hyperlipidemia   . Hypertension   . Hypogonadism male   . Hypogonadism male   . Melanoma ([place])    at T4 surgically excised at [facility] (2020) free margins and sentinel node biopsy negative   Past Surgical History:  Procedure Laterality Date  . APPENDECTOMY    . COLONOSCOPY    . ROTATOR CUFF REPAIR Right    Current Outpatient Medications on File Prior to Visit  Medication Sig Dispense Refill  . allopurinol (ZYLOPRIM) 100 MG tablet TAKE 2 TABLETS BY MOUTH EVERY DAY 180 tablet 3  . aspirin 81 MG tablet Take 1 tablet (81 mg total) by mouth daily for 21 days. 21 tablet 0  . atorvastatin (LIPITOR) 40 MG tablet Take 1 tablet (40 mg total) by mouth daily at 6 PM. 30 tablet 1  . azelastine (ASTELIN) 0.1 % nasal spray SPRAY 2 SPRAYS INTO EACH NOSTRIL TWICE A DAY AS DIRECTED (Patient taking differently: Place 2 sprays into both nostrils 2 (two) times daily as needed for allergies. ) 30 mL 11  . benzonatate (TESSALON) 200 MG capsule Take 1 capsule (200 mg total) by mouth 3 (three) times daily as needed for cough. 30 capsule 1  . Cholecalciferol (VITAMIN D3) 2000 UNITS TABS Take 2,000 Units by mouth daily.     . clopidogrel (PLAVIX) 75 MG tablet Take 1 tablet (75 mg total) by mouth daily. 30 tablet 11  . L-Arginine 500 MG TABS Take 500 mg by mouth daily.     Marland Kitchen losartan (COZAAR) 100 MG tablet TAKE 1 TABLET BY MOUTH EVERY DAY 90 tablet 2  . Magnesium 500 MG CAPS Take 500 mg by mouth daily.     . Multiple Vitamins-Minerals (CENTRUM SILVER ADULT 50+) TABS Take 1 tablet by mouth daily.     . Omega-3 Fatty Acids (FISH OIL) 1000 MG CAPS Take 1,000 mg by mouth daily.     . traMADol (ULTRAM) 50 MG tablet Take 50 mg by mouth every 12 (twelve) hours as needed for moderate pain.    . vitamin B-12 (CYANOCOBALAMIN) 1000 MCG tablet Take 1,000 mcg by  mouth daily.     . Zinc 50 MG TABS Take 50 mg by mouth daily.     . betamethasone dipropionate 0.05 % cream APPLY TO AFFECTED AREA TWICE A DAY     No current facility-administered medications on file prior to visit.   No Known Allergies Social History   Socioeconomic History  . Marital status: Married    Spouse name: Not on file  . Number of children: Not on file  . Years of education: Not on file  . Highest education level: Not on file  Occupational History  . Not on file  Tobacco Use  . Smoking status: Never Smoker  . Smokeless tobacco: Never Used  Substance and Sexual Activity  . Alcohol use: No  . Drug use: No  . Sexual activity: Not on file    Comment: married to [place].  Retired.  Weightlifter  Other Topics Concern  . Not on file  Social History Narrative  . Not on file   Social  Determinants of Health   Financial Resource Strain:   . Difficulty of Paying Living Expenses: Not on file  Food Insecurity:   . Worried About Charity fundraiser in the Last Year: Not on file  . Ran Out of Food in the Last Year: Not on file  Transportation Needs:   . Lack of Transportation (Medical): Not on file  . Lack of Transportation (Non-Medical): Not on file  Physical Activity:   . Days of Exercise per Week: Not on file  . Minutes of Exercise per Session: Not on file  Stress:   . Feeling of Stress : Not on file  Social Connections:   . Frequency of Communication with Friends and Family: Not on file  . Frequency of Social Gatherings with Friends and Family: Not on file  . Attends Religious Services: Not on file  . Active Member of Clubs or Organizations: Not on file  . Attends Archivist Meetings: Not on file  . Marital Status: Not on file  Intimate Partner Violence:   . Fear of Current or Ex-Partner: Not on file  . Emotionally Abused: Not on file  . Physically Abused: Not on file  . Sexually Abused: Not on file      Review of Systems  All other systems  reviewed and are negative.      Objective:   Physical Exam Vitals reviewed.  Constitutional:      General: He is not in acute distress.    Appearance: Normal appearance. He is not ill-appearing, toxic-appearing or diaphoretic.  HENT:     Head: Normocephalic and atraumatic.  Cardiovascular:     Rate and Rhythm: Normal rate and regular rhythm.     Heart sounds: No murmur. No friction rub. No gallop.   Pulmonary:     Effort: Pulmonary effort is normal. No respiratory distress.     Breath sounds: Normal breath sounds. No stridor. No wheezing, rhonchi or rales.  Chest:     Chest wall: No tenderness.  Musculoskeletal:     Right lower leg: No edema.     Left lower leg: No edema.  Neurological:     Mental Status: He is alert.       Assessment & Plan:  Cryptogenic stroke ([place])  Loss of vision  Essential hypertension  Pure hypercholesterolemia  Emphasized the importance of having the event monitor to rule out paroxysmal atrial fibrillation as a cause of embolic stroke.  I explained to the patient that if this was determined to be the cause, we would switch the patient from dual antiplatelet therapy to Eliquis.  However at the present time we will work under the assumption that his stroke was due to to embolic phenomenon from plaque although his CT angiogram of the head and neck revealed no significant plaque.  Therefore I recommended that he continue a statin and that we try to drive his LDL cholesterol below 70 if possible.  I would recheck fasting lab work in 3 to 6 months on the statin.  Patient can resume exercising after he has the event monitor placed.  He denies any depression today.  Reviewed all the lab work from the hospital in detail with the patient along with a CT angiogram of his head, neck, echocardiogram and lab work.  Patient was given the opportunity to ask any questions and they were answered to the best of my ability.  Follow-up again in 6 months or as needed

## 2019-06-02 DIAGNOSIS — H43822 Vitreomacular adhesion, left eye: Secondary | ICD-10-CM | POA: Diagnosis not present

## 2019-06-02 DIAGNOSIS — H3581 Retinal edema: Secondary | ICD-10-CM | POA: Diagnosis not present

## 2019-06-02 DIAGNOSIS — H35033 Hypertensive retinopathy, bilateral: Secondary | ICD-10-CM | POA: Diagnosis not present

## 2019-06-02 DIAGNOSIS — H3411 Central retinal artery occlusion, right eye: Secondary | ICD-10-CM | POA: Diagnosis not present

## 2019-06-05 ENCOUNTER — Ambulatory Visit (INDEPENDENT_AMBULATORY_CARE_PROVIDER_SITE_OTHER): Payer: Medicare Other

## 2019-06-05 DIAGNOSIS — H3411 Central retinal artery occlusion, right eye: Secondary | ICD-10-CM | POA: Diagnosis not present

## 2019-06-05 DIAGNOSIS — I639 Cerebral infarction, unspecified: Secondary | ICD-10-CM

## 2019-06-05 DIAGNOSIS — I4891 Unspecified atrial fibrillation: Secondary | ICD-10-CM | POA: Diagnosis not present

## 2019-06-07 ENCOUNTER — Other Ambulatory Visit: Payer: Self-pay | Admitting: Family Medicine

## 2019-06-10 DIAGNOSIS — C4359 Malignant melanoma of other part of trunk: Secondary | ICD-10-CM | POA: Diagnosis not present

## 2019-06-11 DIAGNOSIS — H3401 Transient retinal artery occlusion, right eye: Secondary | ICD-10-CM | POA: Diagnosis not present

## 2019-06-11 DIAGNOSIS — H3561 Retinal hemorrhage, right eye: Secondary | ICD-10-CM | POA: Diagnosis not present

## 2019-06-26 ENCOUNTER — Other Ambulatory Visit: Payer: Self-pay | Admitting: Family Medicine

## 2019-06-30 DIAGNOSIS — H43822 Vitreomacular adhesion, left eye: Secondary | ICD-10-CM | POA: Diagnosis not present

## 2019-06-30 DIAGNOSIS — H35033 Hypertensive retinopathy, bilateral: Secondary | ICD-10-CM | POA: Diagnosis not present

## 2019-06-30 DIAGNOSIS — H3411 Central retinal artery occlusion, right eye: Secondary | ICD-10-CM | POA: Diagnosis not present

## 2019-06-30 DIAGNOSIS — H3581 Retinal edema: Secondary | ICD-10-CM | POA: Diagnosis not present

## 2019-07-07 ENCOUNTER — Other Ambulatory Visit: Payer: Self-pay | Admitting: Student

## 2019-07-07 DIAGNOSIS — I4891 Unspecified atrial fibrillation: Secondary | ICD-10-CM

## 2019-07-07 DIAGNOSIS — I639 Cerebral infarction, unspecified: Secondary | ICD-10-CM

## 2019-07-07 DIAGNOSIS — H3411 Central retinal artery occlusion, right eye: Secondary | ICD-10-CM

## 2019-07-17 ENCOUNTER — Ambulatory Visit: Payer: Medicare Other | Attending: Internal Medicine

## 2019-07-17 DIAGNOSIS — Z23 Encounter for immunization: Secondary | ICD-10-CM

## 2019-07-17 NOTE — Progress Notes (Signed)
   Covid-19 Vaccination Clinic  Name:  Chad Serrano    MRN: [id number] DOB: 01-22-1946  07/17/2019  [name] was observed post Covid-19 immunization for 15 minutes without incidence. He was provided with Vaccine Information Sheet and instruction to access the V-Safe system.   [name] was instructed to call 911 with any severe reactions post vaccine: Marland Kitchen Difficulty breathing  . Swelling of your face and throat  . A fast heartbeat  . A bad rash all over your body  . Dizziness and weakness    Immunizations Administered    Name Date Dose VIS Date Route   Pfizer COVID-19 Vaccine 07/17/2019 11:10 AM 0.3 mL 05/01/2019 Intramuscular   Manufacturer: [place]   Lot: [id number]   [place]: [id number]

## 2019-07-20 ENCOUNTER — Ambulatory Visit: Payer: Medicare Other

## 2019-07-21 ENCOUNTER — Other Ambulatory Visit: Payer: Self-pay

## 2019-07-21 ENCOUNTER — Ambulatory Visit (INDEPENDENT_AMBULATORY_CARE_PROVIDER_SITE_OTHER): Payer: Medicare Other | Admitting: Student

## 2019-07-21 ENCOUNTER — Encounter: Payer: Self-pay | Admitting: Student

## 2019-07-21 VITALS — BP 138/84 | HR 97 | Ht 71.0 in | Wt 221.6 lb

## 2019-07-21 DIAGNOSIS — I639 Cerebral infarction, unspecified: Secondary | ICD-10-CM | POA: Diagnosis not present

## 2019-07-21 DIAGNOSIS — H3411 Central retinal artery occlusion, right eye: Secondary | ICD-10-CM | POA: Diagnosis not present

## 2019-07-21 NOTE — Patient Instructions (Addendum)
Medication Instructions:  none *If you need a refill on your cardiac medications before your next appointment, please call your pharmacy*   Lab Work: none If you have labs (blood work) drawn today and your tests are completely normal, you will receive your results only by: Marland Kitchen MyChart Message (if you have MyChart) OR . A paper copy in the mail If you have any lab test that is abnormal or we need to change your treatment, we will call you to review the results.   Testing/Procedures: none   Follow-Up: At [facility], you and your health needs are our priority.  As part of our continuing mission to provide you with exceptional heart care, we have created designated Provider Care Teams.  These Care Teams include your primary Cardiologist (physician) and Advanced Practice Providers (APPs -  Physician Assistants and Nurse Practitioners) who all work together to provide you with the care you need, when you need it.  Your next appointment:   As needed with Dr Rayann Heman

## 2019-07-21 NOTE — Progress Notes (Signed)
PCP:  Susy Frizzle, MD Primary Cardiologist: No primary care provider on file. Electrophysiologist: Dr. Cornelius Moras Chad Serrano is a 74 y.o. male with past medical history of cryptogenic stroke/R central retinal artery occlusion who presents today for routine electrophysiology follow-up s/p event monitor. They are seen for Dr. Rayann Heman.   The patient was admitted on 05/25/2019 with abrupt R sided vision loss. He sae an ophthalmologist who saw "blocked arteries" and sent him to ED.  Imaging demonstrated no acute intracranial abnormalitiy.  They have undergone workup for stroke including echocardiogram and CTA Head and Neck which showed mild bilateral carotid bifurcation atherosclerosis and aortic atherosclerosis.  The patient has been monitored on telemetry which has demonstrated sinus rhythm with no arrhythmias.  Inpatient stroke work-up will not require a TEE per Neurology.   Since discharge he has been doing well.  The preliminary results of his event monitor are negative, with no AF noted between  06/05/2019 - 07/04/2019. He continues to have central R sided vision loss. His ophthalmologist estimates that he may get up to "13%" of that vision back. Otherwise, he is doing well. Reports easy bleeding on ASA + Plavix (with shaving etc).     Past Medical History:  Diagnosis Date  . Anxiety   . Chronic kidney disease   . Gout   . Hyperlipidemia   . Hypertension   . Hypogonadism male   . Hypogonadism male   . Melanoma ([place])    at T4 surgically excised at [facility] (2020) free margins and sentinel node biopsy negative  . Melanoma ([place])   . Stroke [facility])    cryptogenic, right retinal artery occlusion   Past Surgical History:  Procedure Laterality Date  . APPENDECTOMY    . COLONOSCOPY    . ROTATOR CUFF REPAIR Right     Current Outpatient Medications  Medication Sig Dispense Refill  . allopurinol (ZYLOPRIM) 100 MG tablet TAKE 2 TABLETS BY MOUTH EVERY DAY 180 tablet 3  . atorvastatin  (LIPITOR) 40 MG tablet TAKE 1 TABLET (40 MG TOTAL) BY MOUTH DAILY AT 6 PM. 90 tablet 2  . betamethasone dipropionate 0.05 % cream APPLY TO AFFECTED AREA TWICE A DAY    . brimonidine (ALPHAGAN) 0.15 % ophthalmic solution Place 1 drop into the right eye 2 (two) times daily.    . Cholecalciferol (VITAMIN D3) 2000 UNITS TABS Take 2,000 Units by mouth daily.     . clopidogrel (PLAVIX) 75 MG tablet Take 1 tablet (75 mg total) by mouth daily. 30 tablet 11  . L-Arginine 500 MG TABS Take 500 mg by mouth daily.     Marland Kitchen losartan (COZAAR) 100 MG tablet TAKE 1 TABLET BY MOUTH EVERY DAY 90 tablet 2  . Magnesium 500 MG CAPS Take 500 mg by mouth daily.     . Multiple Vitamins-Minerals (CENTRUM SILVER ADULT 50+) TABS Take 1 tablet by mouth daily.     . Omega-3 Fatty Acids (FISH OIL) 1000 MG CAPS Take 1,000 mg by mouth daily.     . traMADol (ULTRAM) 50 MG tablet Take 50 mg by mouth every 12 (twelve) hours as needed for moderate pain.    . vitamin B-12 (CYANOCOBALAMIN) 1000 MCG tablet Take 1,000 mcg by mouth daily.     . Zinc 50 MG TABS Take 50 mg by mouth daily.      No current facility-administered medications for this visit.    No Known Allergies  Social History   Socioeconomic History  . Marital  status: Married    Spouse name: Not on file  . Number of children: Not on file  . Years of education: Not on file  . Highest education level: Not on file  Occupational History  . Not on file  Tobacco Use  . Smoking status: Never Smoker  . Smokeless tobacco: Never Used  Substance and Sexual Activity  . Alcohol use: No  . Drug use: No  . Sexual activity: Not on file    Comment: married to [place].  Retired.  Weightlifter  Other Topics Concern  . Not on file  Social History Narrative  . Not on file   Social Determinants of Health   Financial Resource Strain:   . Difficulty of Paying Living Expenses: Not on file  Food Insecurity:   . Worried About Charity fundraiser in the Last Year: Not on file  .  Ran Out of Food in the Last Year: Not on file  Transportation Needs:   . Lack of Transportation (Medical): Not on file  . Lack of Transportation (Non-Medical): Not on file  Physical Activity:   . Days of Exercise per Week: Not on file  . Minutes of Exercise per Session: Not on file  Stress:   . Feeling of Stress : Not on file  Social Connections:   . Frequency of Communication with Friends and Family: Not on file  . Frequency of Social Gatherings with Friends and Family: Not on file  . Attends Religious Services: Not on file  . Active Member of Clubs or Organizations: Not on file  . Attends Archivist Meetings: Not on file  . Marital Status: Not on file  Intimate Partner Violence:   . Fear of Current or Ex-Partner: Not on file  . Emotionally Abused: Not on file  . Physically Abused: Not on file  . Sexually Abused: Not on file     Review of Systems: General: No chills, fever, night sweats or weight changes  Cardiovascular:  No chest pain, dyspnea on exertion, edema, orthopnea, palpitations, paroxysmal nocturnal dyspnea Dermatological: No rash, lesions or masses Respiratory: No cough, dyspnea Urologic: No hematuria, dysuria Abdominal: No nausea, vomiting, diarrhea, bright red blood per rectum, melena, or hematemesis Neurologic: No visual changes, weakness, changes in mental status All other systems reviewed and are otherwise negative except as noted above.  Physical Exam: Vitals:   07/21/19 1025  BP: 138/84  Pulse: 97  SpO2: 95%  Weight: 221 lb 9.6 oz (100.5 kg)  Height: 5\' 11"  (1.803 m)    GEN- The patient is well appearing, alert and oriented x 3 today.   HEENT: normocephalic, atraumatic; sclera clear, conjunctiva pink; hearing intact; oropharynx clear; neck supple, no JVP Lymph- no cervical lymphadenopathy Lungs- Clear to ausculation bilaterally, normal work of breathing.  No wheezes, rales, rhonchi Heart- Regular rate and rhythm, no murmurs, rubs or  gallops, PMI not laterally displaced GI- soft, non-tender, non-distended, bowel sounds present, no hepatosplenomegaly Extremities- no clubbing, cyanosis, or edema; DP/PT/radial pulses 2+ bilaterally MS- no significant deformity or atrophy Skin- warm and dry, no rash or lesion Psych- euthymic mood, full affect Neuro- strength and sensation are intact  EKG is not ordered.   Assessment and Plan:  1. Cryptogenic stroke/ R Central retinal artery occlusion Doing well overall Event monitor results pending. Preliminary reading: shows monitoring period of 06/05/2019 - 07/04/2019. Baseline sample showed Sinus Tachycardia w/MF PVCs (3 in 1 min) with a heart rate of 106.8 bpm. There were 0 critical, 0 serious,  and 1 stable events that occurred.  No indication for Craig at this time.  He is not interested in pursuing a loop recorder, but will call us back at any time if he changes his mind.    Shirley Friar, PA-C  07/21/19 10:36 AM

## 2019-07-31 DIAGNOSIS — H35033 Hypertensive retinopathy, bilateral: Secondary | ICD-10-CM | POA: Diagnosis not present

## 2019-07-31 DIAGNOSIS — H43823 Vitreomacular adhesion, bilateral: Secondary | ICD-10-CM | POA: Diagnosis not present

## 2019-07-31 DIAGNOSIS — H3411 Central retinal artery occlusion, right eye: Secondary | ICD-10-CM | POA: Diagnosis not present

## 2019-07-31 DIAGNOSIS — H3581 Retinal edema: Secondary | ICD-10-CM | POA: Diagnosis not present

## 2019-08-11 ENCOUNTER — Ambulatory Visit: Payer: Medicare Other | Attending: Internal Medicine

## 2019-08-11 DIAGNOSIS — Z23 Encounter for immunization: Secondary | ICD-10-CM

## 2019-08-11 NOTE — Progress Notes (Signed)
   Covid-19 Vaccination Clinic  Name:  Chad Serrano    MRN: [id number] DOB: January 27, 1946  08/11/2019  Chad Serrano was observed post Covid-19 immunization for 15 minutes without incident. He was provided with Vaccine Information Sheet and instruction to access the V-Safe system.   Chad Serrano was instructed to call 911 with any severe reactions post vaccine: Marland Kitchen Difficulty breathing  . Swelling of face and throat  . A fast heartbeat  . A bad rash all over body  . Dizziness and weakness   Immunizations Administered    Name Date Dose VIS Date Route   Pfizer COVID-19 Vaccine 08/11/2019  1:34 PM 0.3 mL 05/01/2019 Intramuscular   Manufacturer: [place]   Lot: [id number]   Sellers: [id number]

## 2019-08-19 ENCOUNTER — Telehealth: Payer: Self-pay

## 2019-08-19 ENCOUNTER — Telehealth: Payer: Self-pay | Admitting: Student

## 2019-08-19 NOTE — Telephone Encounter (Signed)
-----   Message from Shirley Friar, PA-C sent at 08/19/2019  9:57 AM EDT ----- Please let him know his event monitor was negative for A fib, and we would still recommend having a loop recorder implanted if he changes his mind.

## 2019-08-19 NOTE — Telephone Encounter (Signed)
lpmtcb 3/31

## 2019-08-19 NOTE — Telephone Encounter (Signed)
The patient has been notified of the monitor result and verbalized understanding.  All questions (if any) were answered. Frederik Schmidt, RN 08/19/2019 12:10 PM

## 2019-08-19 NOTE — Telephone Encounter (Signed)
error 

## 2019-08-19 NOTE — Telephone Encounter (Signed)
Follow Up  Patient was returning phone call  Please call back

## 2019-08-24 ENCOUNTER — Encounter: Payer: Self-pay | Admitting: Neurology

## 2019-08-24 ENCOUNTER — Inpatient Hospital Stay: Payer: Medicare Other | Admitting: Neurology

## 2019-08-24 ENCOUNTER — Ambulatory Visit (INDEPENDENT_AMBULATORY_CARE_PROVIDER_SITE_OTHER): Payer: Medicare Other | Admitting: Neurology

## 2019-08-24 ENCOUNTER — Other Ambulatory Visit: Payer: Self-pay

## 2019-08-24 VITALS — BP 149/97 | HR 83 | Temp 97.8°F | Ht 71.0 in | Wt 224.0 lb

## 2019-08-24 DIAGNOSIS — H3411 Central retinal artery occlusion, right eye: Secondary | ICD-10-CM | POA: Diagnosis not present

## 2019-08-24 DIAGNOSIS — I639 Cerebral infarction, unspecified: Secondary | ICD-10-CM | POA: Diagnosis not present

## 2019-08-24 DIAGNOSIS — H5440 Blindness, one eye, unspecified eye: Secondary | ICD-10-CM

## 2019-08-24 NOTE — Patient Instructions (Signed)
I had a long d/w patient about his recent right eye painless vision loss from central retinal artery occlusion, risk for recurrent stroke/TIAs, personally independently reviewed imaging studies and stroke evaluation results and answered questions.Continue Plavix 75 mg daily for secondary stroke prevention and maintain strict control of hypertension with blood pressure goal below 130/90, diabetes with hemoglobin A1c goal below 6.5% and lipids with LDL cholesterol goal below 70 mg/dL. I also advised the patient to eat a healthy diet with plenty of whole grains, cereals, fruits and vegetables, exercise regularly and maintain ideal body weight Followup in the future with my nurse practitioner Janett Billow in 6 months or call earlier if necessary.  Stroke Prevention Some medical conditions and behaviors are associated with a higher chance of having a stroke. You can help prevent a stroke by making nutrition, lifestyle, and other changes, including managing any medical conditions you may have. What nutrition changes can be made?   Eat healthy foods. You can do this by: ? Choosing foods high in fiber, such as fresh fruits and vegetables and whole grains. ? Eating at least 5 or more servings of fruits and vegetables a day. Try to fill half of your plate at each meal with fruits and vegetables. ? Choosing lean protein foods, such as lean cuts of meat, poultry without skin, fish, tofu, beans, and nuts. ? Eating low-fat dairy products. ? Avoiding foods that are high in salt (sodium). This can help lower blood pressure. ? Avoiding foods that have saturated fat, trans fat, and cholesterol. This can help prevent high cholesterol. ? Avoiding processed and premade foods.  Follow your health care provider's specific guidelines for losing weight, controlling high blood pressure (hypertension), lowering high cholesterol, and managing diabetes. These may include: ? Reducing your daily calorie intake. ? Limiting your daily  sodium intake to 1,500 milligrams (mg). ? Using only healthy fats for cooking, such as olive oil, canola oil, or sunflower oil. ? Counting your daily carbohydrate intake. What lifestyle changes can be made?  Maintain a healthy weight. Talk to your health care provider about your ideal weight.  Get at least 30 minutes of moderate physical activity at least 5 days a week. Moderate activity includes brisk walking, biking, and swimming.  Do not use any products that contain nicotine or tobacco, such as cigarettes and e-cigarettes. If you need help quitting, ask your health care provider. It may also be helpful to avoid exposure to secondhand smoke.  Limit alcohol intake to no more than 1 drink a day for nonpregnant women and 2 drinks a day for men. One drink equals 12 oz of beer, 5 oz of wine, or 1 oz of hard liquor.  Stop any illegal drug use.  Avoid taking birth control pills. Talk to your health care provider about the risks of taking birth control pills if: ? You are over 41 years old. ? You smoke. ? You get migraines. ? You have ever had a blood clot. What other changes can be made?  Manage your cholesterol levels. ? Eating a healthy diet is important for preventing high cholesterol. If cholesterol cannot be managed through diet alone, you may also need to take medicines. ? Take any prescribed medicines to control your cholesterol as told by your health care provider.  Manage your diabetes. ? Eating a healthy diet and exercising regularly are important parts of managing your blood sugar. If your blood sugar cannot be managed through diet and exercise, you may need to take medicines. ? Take  any prescribed medicines to control your diabetes as told by your health care provider.  Control your hypertension. ? To reduce your risk of stroke, try to keep your blood pressure below 130/80. ? Eating a healthy diet and exercising regularly are an important part of controlling your blood  pressure. If your blood pressure cannot be managed through diet and exercise, you may need to take medicines. ? Take any prescribed medicines to control hypertension as told by your health care provider. ? Ask your health care provider if you should monitor your blood pressure at home. ? Have your blood pressure checked every year, even if your blood pressure is normal. Blood pressure increases with age and some medical conditions.  Get evaluated for sleep disorders (sleep apnea). Talk to your health care provider about getting a sleep evaluation if you snore a lot or have excessive sleepiness.  Take over-the-counter and prescription medicines only as told by your health care provider. Aspirin or blood thinners (antiplatelets or anticoagulants) may be recommended to reduce your risk of forming blood clots that can lead to stroke.  Make sure that any other medical conditions you have, such as atrial fibrillation or atherosclerosis, are managed. What are the warning signs of a stroke? The warning signs of a stroke can be easily remembered as BEFAST.  B is for balance. Signs include: ? Dizziness. ? Loss of balance or coordination. ? Sudden trouble walking.  E is for eyes. Signs include: ? A sudden change in vision. ? Trouble seeing.  F is for face. Signs include: ? Sudden weakness or numbness of the face. ? The face or eyelid drooping to one side.  A is for arms. Signs include: ? Sudden weakness or numbness of the arm, usually on one side of the body.  S is for speech. Signs include: ? Trouble speaking (aphasia). ? Trouble understanding.  T is for time. ? These symptoms may represent a serious problem that is an emergency. Do not wait to see if the symptoms will go away. Get medical help right away. Call your local emergency services (911 in the [place] Do not drive yourself to the hospital.  Other signs of stroke may include: ? A sudden, severe headache with no known  cause. ? Nausea or vomiting. ? Seizure. Where to find more information For more information, visit:  American Stroke Association: www.strokeassociation.org  National Stroke Association: www.stroke.org Summary  You can prevent a stroke by eating healthy, exercising, not smoking, limiting alcohol intake, and managing any medical conditions you may have.  Do not use any products that contain nicotine or tobacco, such as cigarettes and e-cigarettes. If you need help quitting, ask your health care provider. It may also be helpful to avoid exposure to secondhand smoke.  Remember BEFAST for warning signs of stroke. Get help right away if you or a loved one has any of these signs. This information is not intended to replace advice given to you by your health care provider. Make sure you discuss any questions you have with your health care provider. Document Revised: 04/19/2017 Document Reviewed: 06/12/2016 Elsevier Patient Education  2020 [employer].

## 2019-08-24 NOTE — Progress Notes (Signed)
[facility] [address] [place]. [place] [address] [phone]       OFFICE FOLLOW-UP NOTE  [name] Date of Birth:  [birth year] Medical Record Number:  [id number]   HPI: [name] is a 74 year old pleasant Caucasian male seen today for initial office follow-up visit following hospital consultation for vision loss in January 2021.  History is obtained from the patient, review of electronic medical records and I personally reviewed imaging films in PACS.  Patient developed sudden onset of right eye vision loss on New Year's Eve last year.  He was seen by ophthalmologist who noticed blocked arteries and referred him to [facility] for outpatient work-up for embolic stroke.  His vision did not improve except he had a little bit of light perception in the right eye temporal field only.  He denied any eye pain injury.  He denied any accompanying stroke symptoms in the form of slurred speech, dizziness, gait ataxia extremity weakness or numbness.  He had no prior history of strokes or TIAs.  He denies any prior history of temporal headaches, scalp tenderness or jaw claudication.  His work-up in the hospital showed ESR to be normal at 2 mm, C-reactive protein was nondetectable.  CT scan of the brain was unremarkable and CT angiogram showed mild carotid bifurcation atherosclerosis without large vessel stenosis or occlusion.  MRI scan showed no acute infarct only changes of age-appropriate small vessel disease.  Transthoracic echo showed normal ejection fraction and no cardiac source of embolism.  There was a small mobile echodensity in the right atrium which was unclear significance.  LDL cholesterol was elevated 135 mg percent and hemoglobin A1c 5.6.  Patient had no history of atrial fibrillation and cardiac monitoring in the hospital was unremarkable.  Loop recorder was suggested but the patient refused.  He subsequently has had 30-day external heart monitor done on  07/07/2019 which showed no evidence of cardiac arrhythmias.  He is still not decided whether he wants to undergo loop recorder.  He is right eye vision loss appears to still be nearly complete with only mild light perception.  He was seen by eye doctor recently who noticed no significant improvement.  Patient has been able to drive but has to be careful when he turns.  He has had no accidents.  He has no complaints today.  He did tolerate aspirin Plavix well and currently stop aspirin and is on Plavix alone which is tolerating well without bruising or bleeding.  Is tolerating Lipitor well without muscle aches and pains.  He has an upcoming appointment with his primary care physician to check his follow-up lipid profile.  He has started eating healthy and he does exercise regularly and he knows he needs to lose some weight.  ROS:   14 system review of systems is positive for vision loss in the right eye only and all other systems negative PMH:  Past Medical History:  Diagnosis Date  . Anxiety   . Chronic kidney disease   . Gout   . Hyperlipidemia   . Hypertension   . Hypogonadism male   . Hypogonadism male   . Melanoma ([place])    at T4 surgically excised at [facility] (2020) free margins and sentinel node biopsy negative  . Melanoma ([place])   . Stroke [facility])    cryptogenic, right retinal artery occlusion    Social History:  Social History   Socioeconomic History  . Marital status: Married    Spouse name: Not on  file  . Number of children: Not on file  . Years of education: Not on file  . Highest education level: Not on file  Occupational History  . Not on file  Tobacco Use  . Smoking status: Never Smoker  . Smokeless tobacco: Never Used  Substance and Sexual Activity  . Alcohol use: No  . Drug use: No  . Sexual activity: Not on file    Comment: married to [place].  Retired.  Weightlifter  Other Topics Concern  . Not on file  Social History Narrative  . Not on file   Social Determinants  of Health   Financial Resource Strain:   . Difficulty of Paying Living Expenses:   Food Insecurity:   . Worried About Charity fundraiser in the Last Year:   . Arboriculturist in the Last Year:   Transportation Needs:   . Film/video editor (Medical):   Marland Kitchen Lack of Transportation (Non-Medical):   Physical Activity:   . Days of Exercise per Week:   . Minutes of Exercise per Session:   Stress:   . Feeling of Stress :   Social Connections:   . Frequency of Communication with Friends and Family:   . Frequency of Social Gatherings with Friends and Family:   . Attends Religious Services:   . Active Member of Clubs or Organizations:   . Attends Archivist Meetings:   Marland Kitchen Marital Status:   Intimate Partner Violence:   . Fear of Current or Ex-Partner:   . Emotionally Abused:   Marland Kitchen Physically Abused:   . Sexually Abused:     Medications:   Current Outpatient Medications on File Prior to Visit  Medication Sig Dispense Refill  . atorvastatin (LIPITOR) 40 MG tablet TAKE 1 TABLET (40 MG TOTAL) BY MOUTH DAILY AT 6 PM. 90 tablet 2  . betamethasone dipropionate 0.05 % cream APPLY TO AFFECTED AREA TWICE A DAY    . brimonidine (ALPHAGAN) 0.15 % ophthalmic solution Place 1 drop into the right eye 2 (two) times daily.    . Cholecalciferol (VITAMIN D3) 2000 UNITS TABS Take 2,000 Units by mouth daily.     . clopidogrel (PLAVIX) 75 MG tablet Take 1 tablet (75 mg total) by mouth daily. 30 tablet 11  . L-Arginine 500 MG TABS Take 500 mg by mouth daily.     Marland Kitchen losartan (COZAAR) 100 MG tablet TAKE 1 TABLET BY MOUTH EVERY DAY 90 tablet 2  . Magnesium 500 MG CAPS Take 500 mg by mouth daily.     . Multiple Vitamins-Minerals (CENTRUM SILVER ADULT 50+) TABS Take 1 tablet by mouth daily.     . Omega-3 Fatty Acids (FISH OIL) 1000 MG CAPS Take 1,000 mg by mouth daily.     . traMADol (ULTRAM) 50 MG tablet Take 50 mg by mouth every 12 (twelve) hours as needed for moderate pain.    . vitamin B-12  (CYANOCOBALAMIN) 1000 MCG tablet Take 1,000 mcg by mouth daily.     . Zinc 50 MG TABS Take 50 mg by mouth daily.     Marland Kitchen allopurinol (ZYLOPRIM) 100 MG tablet TAKE 2 TABLETS BY MOUTH EVERY DAY 180 tablet 3   No current facility-administered medications on file prior to visit.    Allergies:  No Known Allergies  Physical Exam General: Mildly obese elderly Caucasian male seated, in no evident distress Head: head normocephalic and atraumatic.  Neck: supple with no carotid or supraclavicular bruits Cardiovascular: regular rate and rhythm,  no murmurs Musculoskeletal: no deformity Skin:  no rash/petichiae Vascular:  Normal pulses all extremities Vitals:   08/24/19 1136  BP: (!) 149/97  Pulse: 83  Temp: 97.8 F (36.6 C)   Neurologic Exam Mental Status: Awake and fully alert. Oriented to place and time. Recent and remote memory intact. Attention span, concentration and fund of knowledge appropriate. Mood and affect appropriate.  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  Right pupil is 6 mm in unreactive.  Vision acuity significantly impaired in the right eye with only light perception in crescentic portion of the temporal field in the right eye.  Left pupil and eye are normal. . Extraocular movements full without nystagmus. Visual fields full to confrontation. Hearing intact. Facial sensation intact. Face, tongue, palate moves normally and symmetrically.  Motor: Normal bulk and tone. Normal strength in all tested extremity muscles. Sensory.: intact to touch ,pinprick .position and vibratory sensation.  Coordination: Rapid alternating movements normal in all extremities. Finger-to-nose and heel-to-shin performed accurately bilaterally. Gait and Station: Arises from chair without difficulty. Stance is normal. Gait demonstrates normal stride length and balance . Able to heel, toe and tandem walk without difficulty.  Reflexes: 1+ and symmetric. Toes downgoing.   NIHSS   0 Modified Rankin  2   ASSESSMENT: 74 year old Caucasian male with sudden onset of painless right eye vision loss in December 2020 secondary to central retinal artery occlusion.  Vascular risk factors of hypertension, hyperlipidemia, age, mild obesity and carotid atherosclerosis     PLAN: I had a long d/w patient about his recent right eye painless vision loss from central retinal artery occlusion, risk for recurrent stroke/TIAs, personally independently reviewed imaging studies and stroke evaluation results and answered questions.Continue Plavix 75 mg daily for secondary stroke prevention and maintain strict control of hypertension with blood pressure goal below 130/90, diabetes with hemoglobin A1c goal below 6.5% and lipids with LDL cholesterol goal below 70 mg/dL. I also advised the patient to eat a healthy diet with plenty of whole grains, cereals, fruits and vegetables, exercise regularly and maintain ideal body weight Followup in the future with my nurse practitioner Janett Billow in 6 months or call earlier if necessary. Greater than 50% of time during this 25 minute visit was spent on counseling,explanation of diagnosis of CRAO, planning of further management, discussion with patient and family and coordination of care Antony Contras, MD Note: This document was prepared with digital dictation and possible smart phrase technology. Any transcriptional errors that result from this process are unintentional

## 2019-09-02 DIAGNOSIS — L570 Actinic keratosis: Secondary | ICD-10-CM | POA: Diagnosis not present

## 2019-09-02 DIAGNOSIS — Z85820 Personal history of malignant melanoma of skin: Secondary | ICD-10-CM | POA: Diagnosis not present

## 2019-09-02 DIAGNOSIS — L812 Freckles: Secondary | ICD-10-CM | POA: Diagnosis not present

## 2019-09-02 DIAGNOSIS — L821 Other seborrheic keratosis: Secondary | ICD-10-CM | POA: Diagnosis not present

## 2019-09-02 DIAGNOSIS — L400 Psoriasis vulgaris: Secondary | ICD-10-CM | POA: Diagnosis not present

## 2019-09-04 ENCOUNTER — Telehealth: Payer: Self-pay | Admitting: *Deleted

## 2019-09-04 NOTE — Telephone Encounter (Signed)
   [facility] Pre-operative Risk Assessment    Request for surgical clearance:  1. What type of surgery is being performed? ESI LUMBAR   2. When is this surgery scheduled? TBD   3. What type of clearance is required (medical clearance vs. Pharmacy clearance to hold med vs. Both)? MEDICAL  4. Are there any medications that need to be held prior to surgery and how long? PLAVIX x 7 DAYS PRIOR TO INJECTION   5. Practice name and name of physician performing surgery? [place]; DR. PAUL HARKINS   6. What is your office phone number [phone]    7.   What is your office fax number [phone]  8.   Anesthesia type (None, local, MAC, general) ? LIDOCAINE, BUPIVACAINE, Loleta Chance 09/04/2019, 1:55 PM  _________________________________________________________________   (provider comments below)

## 2019-09-07 NOTE — Telephone Encounter (Signed)
   Primary Cardiologist: Dr. Rayann Heman  Chart reviewed as part of pre-operative protocol coverage. 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 Recommended Plavix clearance from PCP/Neurologist.   I will route this recommendation to the requesting party via Epic fax function and remove from pre-op pool.  Please call with questions.  [place], [place] 09/07/2019, 2:17 PM

## 2019-09-10 ENCOUNTER — Telehealth: Payer: Self-pay | Admitting: Neurology

## 2019-09-10 NOTE — Telephone Encounter (Signed)
[facility] called wanting to if the pt would be cleared so that he can stop taking his Plavix. Please advise. You can speak to any receptionist that picks up.

## 2019-09-10 NOTE — Telephone Encounter (Signed)
Clearance form fax to [facility] at [phone] three times and confirmed.

## 2019-09-10 NOTE — Telephone Encounter (Signed)
I receive call from [facility] assistance for Dr. Maryjean Ka that fax was sent three times to [phone] and confirmed. She stated no fax was receive. I advise her that I will fax again to same number and she verbalized understanding.

## 2019-09-10 NOTE — Telephone Encounter (Signed)
I tried to call Oley Balm back at [phone] and was told she was working at the [employer] office this week. I was told she is not working at the [place] office this week. I stated the clearance form was fax to office in [place]. The switch board stated the only way to speak to [place] was to leave her a message. I refax clearance form two more times to [phone] twice and receive confirmation again.

## 2019-09-14 ENCOUNTER — Encounter: Payer: Self-pay | Admitting: Family Medicine

## 2019-09-14 ENCOUNTER — Ambulatory Visit (INDEPENDENT_AMBULATORY_CARE_PROVIDER_SITE_OTHER): Payer: Medicare Other | Admitting: Family Medicine

## 2019-09-14 ENCOUNTER — Other Ambulatory Visit: Payer: Self-pay

## 2019-09-14 VITALS — BP 144/82 | HR 80 | Temp 97.8°F | Resp 14 | Ht 70.5 in | Wt 218.0 lb

## 2019-09-14 DIAGNOSIS — R7309 Other abnormal glucose: Secondary | ICD-10-CM | POA: Diagnosis not present

## 2019-09-14 DIAGNOSIS — I6930 Unspecified sequelae of cerebral infarction: Secondary | ICD-10-CM

## 2019-09-14 DIAGNOSIS — Z8673 Personal history of transient ischemic attack (TIA), and cerebral infarction without residual deficits: Secondary | ICD-10-CM | POA: Diagnosis not present

## 2019-09-14 DIAGNOSIS — Z136 Encounter for screening for cardiovascular disorders: Secondary | ICD-10-CM | POA: Diagnosis not present

## 2019-09-14 NOTE — Progress Notes (Signed)
Subjective:    Patient ID: Chad Chad Serrano, male    DOB: March 14, 1946, 74 y.o.   MRN: [id number]  HPI  05/2019 Patient was recently admitted to the hospital with retinal artery occlusion/cryptogenic stroke.  I have copied relevant portions of the discharge summary below for my reference.  He was admitted January 4 through January 5:  History of present illness:  [name] a very pleasant73 y.o.male,w gout, hypogonadism, h/o melanoma, hypertension, hyperlipidemia, presented 05/25/19 with right eye vision loss for the previous 3-4 days. Pt was seen at [facility] with concern of stroke. No other stroke symptoms. No diplopia, no difficulty swallowing or speaking. No numbness or weakness of extremities.   Hospital Course:    Cryptogenic stroke. Loss of vision of right eye. MRI with No acute intracranial abnormality. Generalized atrophy without lobar predilection. CTA head and neck with No intracranial arterial occlusion or high-grade stenosis. Mild bilateral carotid bifurcation atherosclerosis and aortic atherosclerosis. HgA1c pending. Evaluated by OT and OP OT recommended. Evaluated by PT and no follow up therapies warranted.  Evaluated by neuro who recommend loop recorder which patient refused. He agreed to 30 day event monitor and cards will arrange. Neuro recommends asa + plavix for 3 weeks then just plavix. Follow up in 4-6 weeks. Continue statin.  Hypertension fair control.  Cont Losartan 100mg  po qday  Gout Cont Allopurinol 200mg  po qday  Procedures: Echo No definite intracardiac source of emboli. Image quality suboptimal for the detection of small valvular lesions. Small, highly mobile finding in right atrium seen well only in subcostal view (clip 79) may represent venous bubble vs excessive mobility of normal structure such as Chad Chad Serrano valve or Chad Serrano network. Cannot exclude other pathologic lesions based on these images. Consider TEE for further  assessment if clinically indicated. No definite evidence of PFO. Left ventricular ejection fraction, by visual estimation, is 55 to 60%. The left ventricle has normal function. There is no left ventricular hypertrophy. Left ventricular diastolic parameters are consistent with Grade I diastolic dysfunction (impaired relaxation). Global right ventricle has normal systolic function.The right ventricular size is normal. No increase in right ventricular wall thickness.  Consultations:  Chad Serrano neurology  Chad Ellison PA electrophysiology  Since discharge from the hospital, the patient is doing well.  He continues to experience vision loss in his right eye.  This has not improved.  He denies any bleeding or bruising since the addition of Plavix.  He was already on aspirin prior to the stroke.  He is now on dual antiplatelet therapy as recommended by neurology for 3 weeks and then will transition to Plavix alone.  Admission labs were significant for an LDL cholesterol greater than 130.  MRI of the brain showed no specific injury.  CT angiogram of the neck and head revealed mild plaque at the carotid artery bifurcation bilaterally but no significant stenosis.  Question as to whether this is embolic stroke due to undiagnosed atrial fibrillation or ischemic stroke due to plaque.  Patient has follow-up appointment with cardiology to have a 1 month event monitor placed.  This is still pending.  He denies any palpitations or tachycardia.  He denies any shortness of breath or chest pain.  He is tolerating the statins well.  He is somewhat anxious.  In the last year I have seen the patient for pneumonia that was difficult to resolve.  He was then diagnosed with melanoma which was treated at [place].  Now he has developed a retinal artery occlusion.  However  he has done well excepting all this and seems to be moving on from all of these medical incidents.  He denies any depression today.  Spent more than 30 minutes today  with the patient reviewing his history and explaining the test and their implications.  At that time, my plan was: Emphasized the importance of having the event monitor to rule out paroxysmal atrial fibrillation as a cause of embolic stroke.  I explained to the patient that if this was determined to be the cause, we would switch the patient from dual antiplatelet therapy to Eliquis.  However at the present time we will work under the assumption that his stroke was due to to embolic phenomenon from plaque although his CT angiogram of the head and neck revealed no significant plaque.  Therefore I recommended that he continue a statin and that we try to drive his LDL cholesterol below 70 if possible.  I would recheck fasting lab work in 3 to 6 months on the statin.  Patient can resume exercising after he has the event monitor placed.  He denies any depression today.  Reviewed all the lab work from the hospital in detail with the patient along with a CT angiogram of his head, neck, echocardiogram and lab work.  Patient was given the opportunity to ask any questions and they were answered to the best of my ability.  Follow-up again in 6 months or as needed   09/14/19 Patient is stopping his Plavix for an epidural steroid injection.  This is scheduled for Thursday he requires this due to constant low back pain secondary to spinal stenosis.  He is taking tramadol at night.  Without the injection, he has 6-7 out of 10 pain on a daily basis.  His neurologist agreed to allow him to stop the Plavix for 1 week prior to the injection.  He is here today to repeat his cholesterol and monitor his hemoglobin A1c.  Past Medical History:  Diagnosis Date   Anxiety    Chronic kidney disease    Gout    Hyperlipidemia    Hypertension    Hypogonadism male    Hypogonadism male    Melanoma ([place])    at T4 surgically excised at [facility] (2020) free margins and sentinel node biopsy negative   Melanoma ([place])    Stroke  ([facility])    cryptogenic, right retinal artery occlusion   Past Surgical History:  Procedure Laterality Date   APPENDECTOMY     COLONOSCOPY     ROTATOR CUFF REPAIR Right    Current Outpatient Medications on File Prior to Visit  Medication Sig Dispense Refill   allopurinol (ZYLOPRIM) 100 MG tablet TAKE 2 TABLETS BY MOUTH EVERY DAY 180 tablet 3   atorvastatin (LIPITOR) 40 MG tablet TAKE 1 TABLET (40 MG TOTAL) BY MOUTH DAILY AT 6 PM. 90 tablet 2   brimonidine (ALPHAGAN) 0.15 % ophthalmic solution Place 1 drop into the right eye 2 (two) times daily.     Cholecalciferol (VITAMIN D3) 2000 UNITS TABS Take 2,000 Units by mouth daily.      clopidogrel (PLAVIX) 75 MG tablet Take 1 tablet (75 mg total) by mouth daily. 30 tablet 11   L-Arginine 500 MG TABS Take 500 mg by mouth daily.      losartan (COZAAR) 100 MG tablet TAKE 1 TABLET BY MOUTH EVERY DAY 90 tablet 2   Magnesium 500 MG CAPS Take 500 mg by mouth daily.      Multiple Vitamins-Minerals (CENTRUM SILVER  ADULT 50+) TABS Take 1 tablet by mouth daily.      Omega-3 Fatty Acids (FISH OIL) 1000 MG CAPS Take 1,000 mg by mouth daily.      traMADol (ULTRAM) 50 MG tablet Take 50 mg by mouth every 12 (twelve) hours as needed for moderate pain.     vitamin B-12 (CYANOCOBALAMIN) 1000 MCG tablet Take 1,000 mcg by mouth daily.      Zinc 50 MG TABS Take 50 mg by mouth daily.      No current facility-administered medications on file prior to visit.   No Known Allergies Social History   Socioeconomic History   Marital status: Married    Spouse name: Not on file   Number of children: Not on file   Years of education: Not on file   Highest education level: Not on file  Occupational History   Not on file  Tobacco Use   Smoking status: Never Smoker   Smokeless tobacco: Never Used  Substance and Sexual Activity   Alcohol use: No   Drug use: No   Sexual activity: Not on file    Comment: married to [place].  Retired.   Weightlifter  Other Topics Concern   Not on file  Social History Narrative   Not on file   Social Determinants of Health   Financial Resource Strain:    Difficulty of Paying Living Expenses:   Food Insecurity:    Worried About Charity fundraiser in the Last Year:    Arboriculturist in the Last Year:   Transportation Needs:    Film/video editor (Medical):    Lack of Transportation (Non-Medical):   Physical Activity:    Days of Exercise per Week:    Minutes of Exercise per Session:   Stress:    Feeling of Stress :   Social Connections:    Frequency of Communication with Friends and Family:    Frequency of Social Gatherings with Friends and Family:    Attends Religious Services:    Active Member of Clubs or Organizations:    Attends Music therapist:    Marital Status:   Intimate Partner Violence:    Fear of Current or Ex-Partner:    Emotionally Abused:    Physically Abused:    Sexually Abused:       Review of Systems  All other systems reviewed and are negative.      Objective:   Physical Exam Vitals reviewed.  Constitutional:      General: He is not in acute distress.    Appearance: Normal appearance. He is not ill-appearing, toxic-appearing or diaphoretic.  HENT:     Head: Normocephalic and atraumatic.  Cardiovascular:     Rate and Rhythm: Normal rate and regular rhythm.     Heart sounds: No murmur. No friction rub. No gallop.   Pulmonary:     Effort: Pulmonary effort is normal. No respiratory distress.     Breath sounds: Normal breath sounds. No stridor. No wheezing, rhonchi or rales.  Chest:     Chest wall: No tenderness.  Musculoskeletal:     Right lower leg: No edema.     Left lower leg: No edema.  Neurological:     Mental Status: He is alert.       Assessment & Plan:  History of CVA (cerebrovascular accident) - Plan: CBC with Differential/Platelet, COMPLETE METABOLIC PANEL WITH GFR, Lipid panel, Hemoglobin  A1c  Goal blood pressure is 130/80.  Patient states  that his blood pressure at home is typically in the 120s over 70-80.  Check hemoglobin A1c.  Goal hemoglobin A1c is less than 6.5.  Check fasting lipid panel.  Goal LDL cholesterol is less than 70.  Encouraged the patient to use Plavix as much as possible for the first year.  We discussed trying gabapentin to help manage the pain better in order to avoid having to discontinue Plavix for epidural steroid injections.  Patient will consider this.

## 2019-09-15 ENCOUNTER — Other Ambulatory Visit: Payer: Self-pay | Admitting: Family Medicine

## 2019-09-15 ENCOUNTER — Telehealth: Payer: Self-pay | Admitting: Family Medicine

## 2019-09-15 DIAGNOSIS — H547 Unspecified visual loss: Secondary | ICD-10-CM

## 2019-09-15 DIAGNOSIS — I639 Cerebral infarction, unspecified: Secondary | ICD-10-CM

## 2019-09-15 DIAGNOSIS — Z8673 Personal history of transient ischemic attack (TIA), and cerebral infarction without residual deficits: Secondary | ICD-10-CM

## 2019-09-15 LAB — CBC WITH DIFFERENTIAL/PLATELET
Absolute Monocytes: 588 cells/uL (ref 200–950)
Basophils Absolute: 39 cells/uL (ref 0–200)
Basophils Relative: 0.7 %
Eosinophils Absolute: 118 cells/uL (ref 15–500)
Eosinophils Relative: 2.1 %
HCT: 42.4 % (ref 38.5–50.0)
Hemoglobin: 14.4 g/dL (ref 13.2–17.1)
Lymphs Abs: 2626 cells/uL (ref 850–3900)
MCH: 33 pg (ref 27.0–33.0)
MCHC: 34 g/dL (ref 32.0–36.0)
MCV: 97.2 fL (ref 80.0–100.0)
MPV: 11.2 fL (ref 7.5–12.5)
Monocytes Relative: 10.5 %
Neutro Abs: 2229 cells/uL (ref 1500–7800)
Neutrophils Relative %: 39.8 %
Platelets: 153 10*3/uL (ref 140–400)
RBC: 4.36 10*6/uL (ref 4.20–5.80)
RDW: 12.4 % (ref 11.0–15.0)
Total Lymphocyte: 46.9 %
WBC: 5.6 10*3/uL (ref 3.8–10.8)

## 2019-09-15 LAB — COMPLETE METABOLIC PANEL WITH GFR
AG Ratio: 2 (calc) (ref 1.0–2.5)
ALT: 34 U/L (ref 9–46)
AST: 33 U/L (ref 10–35)
Albumin: 4.2 g/dL (ref 3.6–5.1)
Alkaline phosphatase (APISO): 98 U/L (ref 35–144)
BUN: 12 mg/dL (ref 7–25)
CO2: 27 mmol/L (ref 20–32)
Calcium: 9 mg/dL (ref 8.6–10.3)
Chloride: 104 mmol/L (ref 98–110)
Creat: 1.04 mg/dL (ref 0.70–1.18)
GFR, Est African American: 82 mL/min/{1.73_m2} (ref >=60)
GFR, Est Non African American: 71 mL/min/{1.73_m2} (ref >=60)
Globulin: 2.1 g/dL (calc) (ref 1.9–3.7)
Glucose, Bld: 98 mg/dL (ref 65–99)
Potassium: 4.6 mmol/L (ref 3.5–5.3)
Sodium: 139 mmol/L (ref 135–146)
Total Bilirubin: 1 mg/dL (ref 0.2–1.2)
Total Protein: 6.3 g/dL (ref 6.1–8.1)

## 2019-09-15 LAB — HEMOGLOBIN A1C
Hgb A1c MFr Bld: 5.3 % of total Hgb (ref <5.7)
Mean Plasma Glucose: 105 (calc)
eAG (mmol/L): 5.8 (calc)

## 2019-09-15 LAB — LIPID PANEL
Cholesterol: 111 mg/dL (ref <200)
HDL: 39 mg/dL — ABNORMAL LOW (ref >=40)
LDL Cholesterol (Calc): 54 mg/dL (calc)
Non-HDL Cholesterol (Calc): 72 mg/dL (calc) (ref <130)
Total CHOL/HDL Ratio: 2.8 (calc) (ref <5.0)
Triglycerides: 94 mg/dL (ref <150)

## 2019-09-15 NOTE — Progress Notes (Signed)
  Chronic Care Management   Note  09/15/2019 Name: Chad Serrano MRN: [id number] DOB: November 10, 1945  Chad Serrano is a 74 y.o. year old male who is a primary care patient of Pickard, Cammie Mcgee, MD. I reached out to Chad Serrano by phone today in response to a referral sent by Chad Serrano's PCP, Dennard Schaumann, Cammie Mcgee, MD.   Chad Serrano was given information about Chronic Care Management services today including:  1. CCM service includes personalized support from designated clinical staff supervised by his physician, including individualized plan of care and coordination with other care providers 2. 24/7 contact phone numbers for assistance for urgent and routine care needs. 3. Service will only be billed when office clinical staff spend 20 minutes or more in a month to coordinate care. 4. Only one practitioner may furnish and bill the service in a calendar month. 5. The patient may stop CCM services at any time (effective at the end of the month) by phone call to the office staff.   Patient agreed to services and verbal consent obtained.   Follow up plan:   [place]

## 2019-09-16 ENCOUNTER — Other Ambulatory Visit: Payer: Self-pay

## 2019-09-16 ENCOUNTER — Ambulatory Visit: Payer: Medicare Other | Admitting: Pharmacist

## 2019-09-16 DIAGNOSIS — E7800 Pure hypercholesterolemia, unspecified: Secondary | ICD-10-CM

## 2019-09-16 DIAGNOSIS — I639 Cerebral infarction, unspecified: Secondary | ICD-10-CM

## 2019-09-16 DIAGNOSIS — H349 Unspecified retinal vascular occlusion: Secondary | ICD-10-CM

## 2019-09-16 DIAGNOSIS — I10 Essential (primary) hypertension: Secondary | ICD-10-CM

## 2019-09-16 NOTE — Patient Instructions (Addendum)
Visit Information  Goals Addressed            This Visit's Progress   . High Blood Pressure - Goal < 130/80       CARE PLAN ENTRY   Current Barriers:  . Uncontrolled hypertension, complicated by hyperlipidemia, spinal stenosis. . Current antihypertensive regimen: losartan 100mg  daily. . Previous antihypertensives tried: none . Last practice recorded BP readings:  BP Readings from Last 3 Encounters:  09/14/19 (!) 144/82  08/24/19 (!) 149/97  07/21/19 138/84   . Current home BP readings: < 130s/80s  Pharmacist Clinical Goal(s):  Marland Kitchen Over the next 30 days, patient will work with PharmD and providers to optimize antihypertensive regimen.  Interventions: . Inter-disciplinary care team collaboration (see longitudinal plan of care) . Comprehensive medication review performed; medication list updated in the electronic medical record.  . Currently monitoring with wrist cuff, explained accuracy benefits of using arm cuff.  Patient Self Care Activities:  . Patient will continue to check BP once daily, document, and provide at future appointments . Patient will focus on medication adherence by using a pill box. . Over the next 30 days, patient will begin monitoring BP with arm cuff at least once daily and record in log.  Will report any abnormal readings > 130/80s to provider.  Initial goal documentation     . Hypercholesterolemia - Goal LDL < 70       CARE PLAN ENTRY (see longitudinal plan of care for additional care plan information)  Current Barriers:  . Uncontrolled hyperlipidemia, complicated by hypertension, spinal stenosis. . Current antihyperlipidemic regimen: atorvastatin 40mg  daily . Previous antihyperlipidemic medications tried none . Most recent lipid panel:     Component Value Date/Time   CHOL 111 09/14/2019 1440   TRIG 94 09/14/2019 1440   HDL 39 (L) 09/14/2019 1440   CHOLHDL 2.8 09/14/2019 1440   VLDL 18 05/26/2019 0624   LDLCALC 54 09/14/2019 1440     Pharmacist Clinical Goal(s):  Marland Kitchen Over the next 30 days, patient will work with PharmD and providers towards optimized antihyperlipidemic therapy  Interventions: . Comprehensive medication review performed; medication list updated in electronic medical record.  Chad Serrano care team collaboration (see longitudinal plan of care)  Patient Self Care Activities:  . Patient will focus on medication adherence by pill box. . Over the next 30 days patient will report any adverse medication effects to provider.  Initial goal documentation     . Retinal Artery Occlusion       CARE PLAN ENTRY  Current Barriers:  . 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  Pharmacist Clinical Goal(s):  Marland Kitchen Over the next 30 days the patient will work with PharmD and providers to optimize antiplatelet therapy.  Interventions: . Comprehensive medication review performed. . Discussed risks of interruptions in Plavix therapy due to steroid injection for back pain.   Patient Self Care Activities:  . Patient verbalizes understanding of plan to continue current therapy.   . Over the next 30 days patient will focus on adherence with pill box. . Over the next 30 days patient will monitor pain level and report any changes to providers in order to optimize pain regimen as it associates antiplatelet therapy.   Initial goal documentation        Chad Serrano was given information about Chronic Care Management services today including:  1. CCM service includes personalized support from designated clinical staff supervised by his physician, including individualized plan of  care and coordination with other care providers 2. 24/7 contact phone numbers for assistance for urgent and routine care needs. 3. Standard insurance, coinsurance, copays and deductibles apply for chronic care management only during months in which we provide at least 20  minutes of these services. Most insurances cover these services at 100%, however patients may be responsible for any copay, coinsurance and/or deductible if applicable. This service may help you avoid the need for more expensive face-to-face services. 4. Only one practitioner may furnish and bill the service in a calendar month. 5. The patient may stop CCM services at any time (effective at the end of the month) by phone call to the office staff.  Patient agreed to services and verbal consent obtained.   The patient verbalized understanding of instructions provided today and agreed to receive a mailed copy of patient instruction and/or educational materials. Telephone follow up appointment with pharmacy team member scheduled for: 10/14/19 at 12:30 pm  Beverly Milch, PharmD Clinical Pharmacist [facility] [phone]   Managing Your Hypertension Hypertension is commonly called high blood pressure. This is when the force of your blood pressing against the walls of your arteries is too strong. Arteries are blood vessels that carry blood from your heart throughout your body. Hypertension forces the heart to work harder to pump blood, and may cause the arteries to become narrow or stiff. Having untreated or uncontrolled hypertension can cause heart attack, stroke, kidney disease, and other problems. What are blood pressure readings? A blood pressure reading consists of a higher number over a lower number. Ideally, your blood pressure should be below 120/80. The first ("top") number is called the systolic pressure. It is a measure of the pressure in your arteries as your heart beats. The second ("bottom") number is called the diastolic pressure. It is a measure of the pressure in your arteries as the heart relaxes. What does my blood pressure reading mean? Blood pressure is classified into four stages. Based on your blood pressure reading, your health care provider may use the  following stages to determine what type of treatment you need, if any. Systolic pressure and diastolic pressure are measured in a unit called mm Hg. Normal  Systolic pressure: below 123456.  Diastolic pressure: below 80. Elevated  Systolic pressure: [id number].  Diastolic pressure: below 80. Hypertension stage 1  Systolic pressure: 0000000.  Diastolic pressure: [id number]. Hypertension stage 2  Systolic pressure: [id number] or above.  Diastolic pressure: 90 or above. What health risks are associated with hypertension? Managing your hypertension is an important responsibility. Uncontrolled hypertension can lead to:  A heart attack.  A stroke.  A weakened blood vessel (aneurysm).  Heart failure.  Kidney damage.  Eye damage.  Metabolic syndrome.  Memory and concentration problems. What changes can I make to manage my hypertension? Hypertension can be managed by making lifestyle changes and possibly by taking medicines. Your health care provider will help you make a plan to bring your blood pressure within a normal range. Eating and drinking   Eat a diet that is high in fiber and potassium, and low in salt (sodium), added sugar, and fat. An example eating plan is called the DASH (Dietary Approaches to Stop Hypertension) diet. To eat this way: ? Eat plenty of fresh fruits and vegetables. Try to fill half of your plate at each meal with fruits and vegetables. ? Eat whole grains, such as whole wheat pasta, brown rice, or whole grain bread. Fill about one quarter of  your plate with whole grains. ? Eat low-fat diary products. ? Avoid fatty cuts of meat, processed or cured meats, and poultry with skin. Fill about one quarter of your plate with lean proteins such as fish, chicken without skin, beans, eggs, and tofu. ? Avoid premade and processed foods. These tend to be higher in sodium, added sugar, and fat.  Reduce your daily sodium intake. Most people with hypertension should eat less than  1,500 mg of sodium a day.  Limit alcohol intake to no more than 1 drink a day for nonpregnant women and 2 drinks a day for men. One drink equals 12 oz of beer, 5 oz of wine, or 1 oz of hard liquor. Lifestyle  Work with your health care provider to maintain a healthy body weight, or to lose weight. Ask what an ideal weight is for you.  Get at least 30 minutes of exercise that causes your heart to beat faster (aerobic exercise) most days of the week. Activities may include walking, swimming, or biking.  Include exercise to strengthen your muscles (resistance exercise), such as weight lifting, as part of your weekly exercise routine. Try to do these types of exercises for 30 minutes at least 3 days a week.  Do not use any products that contain nicotine or tobacco, such as cigarettes and e-cigarettes. If you need help quitting, ask your health care provider.  Control any long-term (chronic) conditions you have, such as high cholesterol or diabetes. Monitoring  Monitor your blood pressure at home as told by your health care provider. Your personal target blood pressure may vary depending on your medical conditions, your age, and other factors.  Have your blood pressure checked regularly, as often as told by your health care provider. Working with your health care provider  Review all the medicines you take with your health care provider because there may be side effects or interactions.  Talk with your health care provider about your diet, exercise habits, and other lifestyle factors that may be contributing to hypertension.  Visit your health care provider regularly. Your health care provider can help you create and adjust your plan for managing hypertension. Will I need medicine to control my blood pressure? Your health care provider may prescribe medicine if lifestyle changes are not enough to get your blood pressure under control, and if:  Your systolic blood pressure is 130 or  higher.  Your diastolic blood pressure is 80 or higher. Take medicines only as told by your health care provider. Follow the directions carefully. Blood pressure medicines must be taken as prescribed. The medicine does not work as well when you skip doses. Skipping doses also puts you at risk for problems. Contact a health care provider if:  You think you are having a reaction to medicines you have taken.  You have repeated (recurrent) headaches.  You feel dizzy.  You have swelling in your ankles.  You have trouble with your vision. Get help right away if:  You develop a severe headache or confusion.  You have unusual weakness or numbness, or you feel faint.  You have severe pain in your chest or abdomen.  You vomit repeatedly.  You have trouble breathing. Summary  Hypertension is when the force of blood pumping through your arteries is too strong. If this condition is not controlled, it may put you at risk for serious complications.  Your personal target blood pressure may vary depending on your medical conditions, your age, and other factors. For most people,  a normal blood pressure is less than 120/80.  Hypertension is managed by lifestyle changes, medicines, or both. Lifestyle changes include weight loss, eating a healthy, low-sodium diet, exercising more, and limiting alcohol. This information is not intended to replace advice given to you by your health care provider. Make sure you discuss any questions you have with your health care provider. Document Revised: 08/29/2018 Document Reviewed: 04/04/2016 Elsevier Patient Education  [place].

## 2019-09-16 NOTE — Chronic Care Management (AMB) (Signed)
Chronic Care Management Pharmacy  Name: Chad Serrano  MRN: [id number] DOB: 1946-02-26   Chief Complaint/ HPI  Chad Serrano,  74 y.o. , male presents for their Initial CCM visit with the clinical pharmacist via telephone.  PCP : Chad Frizzle, MD  Their chronic conditions include: Hypertension, spinal stenosis, hyperlipidemia.  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  Patient was in hospital January 4 and 5.  Today pt stopped plavix for steroid injection d/t back pain. Neuro recommended to stop Plavix 1 week prior to injection.  Discussed gabapentin instead of injections for back pain so he can stay on the Plavix.  BP goal <130/80, A1c < 6.5, LDL <70.  05/28/19 (Chad Serrano) - Recommended LDL goal of <70 and to continue statin   Consult Visit:  08/24/19 Chad Serrano, Neuro) - Consultation after loss of vision Stopped ASA and continued plavix at this visit.  Wants tight control of BP goal <130/90.  07/21/19 ([place], Cardio) - no med changes  05/25/19 Chad Serrano, ED) - referred from ophthalmology, retinal artery occlusion -  started atorvastatin and asa + clopidogrel   Medications: Outpatient Encounter Medications as of 09/16/2019  Medication Sig  . allopurinol (ZYLOPRIM) 100 MG tablet TAKE 2 TABLETS BY MOUTH EVERY DAY  . atorvastatin (LIPITOR) 40 MG tablet TAKE 1 TABLET (40 MG TOTAL) BY MOUTH DAILY AT 6 PM.  . brimonidine (ALPHAGAN) 0.15 % ophthalmic solution Place 1 drop into the right eye 2 (two) times daily.  . Cholecalciferol (VITAMIN D3) 2000 UNITS TABS Take 2,000 Units by mouth daily.   . clopidogrel (PLAVIX) 75 MG tablet Take 1 tablet (75 mg total) by mouth daily.  Chad Serrano L-Arginine 500 MG TABS Take 500 mg by mouth daily.   Chad Serrano losartan (COZAAR) 100 MG tablet TAKE 1 TABLET BY MOUTH EVERY DAY  . Magnesium 500 MG CAPS Take 500 mg by mouth daily.   . Multiple Vitamins-Minerals (CENTRUM  SILVER ADULT 50+) TABS Take 1 tablet by mouth daily.   . Omega-3 Fatty Acids (FISH OIL) 1000 MG CAPS Take 1,000 mg by mouth daily.   . traMADol (ULTRAM) 50 MG tablet Take 50 mg by mouth every 12 (twelve) hours as needed for moderate pain.  . vitamin B-12 (CYANOCOBALAMIN) 1000 MCG tablet Take 1,000 mcg by mouth daily.   . Zinc 50 MG TABS Take 50 mg by mouth daily.    No facility-administered encounter medications on file as of 09/16/2019.    Goals Addressed            This Visit's Progress   . High Blood Pressure - Goal < 130/80       CARE PLAN ENTRY   Current Barriers:  . Uncontrolled hypertension, complicated by hyperlipidemia, spinal stenosis. . Current antihypertensive regimen: losartan 100mg  daily. . Previous antihypertensives tried: none . Last practice recorded BP readings:  BP Readings from Last 3 Encounters:  09/14/19 (!) 144/82  08/24/19 (!) 149/97  07/21/19 138/84   . Current home BP readings: < 130s/80s  Pharmacist Clinical Goal(s):  Chad Serrano Over the next 30 days, patient will work with PharmD and providers to optimize antihypertensive regimen.  Interventions: . Inter-disciplinary care team collaboration (see longitudinal plan of care) . Comprehensive medication review performed; medication list updated in the electronic medical record.  . Currently monitoring with wrist cuff, explained accuracy benefits of using arm cuff.  Patient Self Care Activities:  . Patient will  continue to check BP once daily, document, and provide at future appointments . Patient will focus on medication adherence by using a pill box. . Over the next 30 days, patient will begin monitoring BP with arm cuff at least once daily and record in log.  Will report any abnormal readings > 130/80s to provider.  Initial goal documentation     . Hypercholesterolemia - Goal LDL < 70       CARE PLAN ENTRY (see longitudinal plan of care for additional care plan information)  Current Barriers:   . Uncontrolled hyperlipidemia, complicated by hypertension, spinal stenosis. . Current antihyperlipidemic regimen: atorvastatin 40mg  daily . Previous antihyperlipidemic medications tried none . Most recent lipid panel:     Component Value Date/Time   CHOL 111 09/14/2019 1440   TRIG 94 09/14/2019 1440   HDL 39 (L) 09/14/2019 1440   CHOLHDL 2.8 09/14/2019 1440   VLDL 18 05/26/2019 0624   LDLCALC 54 09/14/2019 1440    Pharmacist Clinical Goal(s):  Chad Serrano Over the next 30 days, patient will work with PharmD and providers towards optimized antihyperlipidemic therapy  Interventions: . Comprehensive medication review performed; medication list updated in electronic medical record.  Chad Serrano care team collaboration (see longitudinal plan of care)  Patient Self Care Activities:  . Patient will focus on medication adherence by pill box. . Over the next 30 days patient will report any adverse medication effects to provider.  Initial goal documentation     . Retinal Artery Occlusion       CARE PLAN ENTRY  Current Barriers:  . 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  Pharmacist Clinical Goal(s):  Chad Serrano Over the next 30 days the patient will work with PharmD and providers to optimize antiplatelet therapy.  Interventions: . Comprehensive medication review performed. . Discussed risks of interruptions in Plavix therapy due to steroid injection for back pain.   Patient Self Care Activities:  . Patient verbalizes understanding of plan to continue current therapy.   . Over the next 30 days patient will focus on adherence with pill box. . Over the next 30 days patient will monitor pain level and report any changes to providers in order to optimize pain regimen as it associates antiplatelet therapy.   Initial goal documentation        Current Diagnosis/Assessment:    Financial Resource Strain: Low Risk   .  Difficulty of Paying Living Expenses: Not hard at all    Hypertension   Office blood pressures are  BP Readings from Last 3 Encounters:  09/14/19 (!) 144/82  08/24/19 (!) 149/97  07/21/19 138/84    Patient has failed these meds in the past: none noted  Patient checks BP at home at least once daily and records in log.  Patient home BP readings are ranging: <130/80 (wrist cuff)  Patient is currently controlled on the following medications: losartan 100mg  daily   We discussed having patient use arm cuff to check blood pressure as it is more accurate than a wrist cuff.  Plan  Continue current meds. Monitor BP using arm cuff at least once daily for a month and record in log.  Contact PCP or PharmD if consistent BP >130/80.  Will f/u with patient in 1 month about BP with new monitoring device.  Retinal Artery Occlusion/Cryptogenic stroke    Patient has failed these meds in past: none noted Patient is currently controlled on the following medications: Plavix 75mg  once daily, brimonidine  0.15% 1 drop in the right eye twice daily  We discussed:  The risks of coming off of Plavix for one week every 3 months to be able to get steroid injection.  Total of 4 weeks a year patient will not be on antiplatelet therapy.  Plan  Continue current medications.  Will discuss pain regimen with PCP for other alternatives.     Spinal stenosis    Patient is currently controlled on the following medications: tramadol 50mg  every 12 hours, steroid injection every 3 months  We discussed:  Current pain level is 7/10.  Patient states that injections seem to wear off after about 2 months.  Alternative treatment for back pain d/t patient having to pause Plavix every three months.  As per Dr. Samella Parr previous note discussed gabapentin as an option, patient willing to try it next time after injection begins to wear off.  He will contact Dr. Dennard Serrano when he needs a prescription.  Plan  Continue current  medications.  Will recommend gabapentin in a few months when patient feels his steroid injection is wearing off.  Will discuss at f/u call about blood pressure in 1 month.     Hypercholesterolemia   Lipid Panel     Component Value Date/Time   CHOL 111 09/14/2019 1440   TRIG 94 09/14/2019 1440   HDL 39 (L) 09/14/2019 1440   CHOLHDL 2.8 09/14/2019 1440   VLDL 18 05/26/2019 0624   LDLCALC 54 09/14/2019 1440    Patient has failed these meds in past: none noted Patient is currently controlled on the following medications: atorvastatin 40mg  nightly  We discussed:  Patient has altered diet to make better choices which has led to better lipid control.  Plan  Continue current medications, no adverse effects noted.    Vaccines   Reviewed and discussed patient's vaccination history.    Immunization History  Administered Date(s) Administered  . Fluad Quad(high Dose 65+) 02/17/2019  . Hepatitis A 11/29/2003, 08/08/2009  . Hepatitis B 08/08/2009, 09/13/2009  . IPV 05/29/1965  . Influenza Split 04/28/2012, 04/13/2013  . Influenza Whole 02/16/2011  . Influenza, High Dose Seasonal PF 02/25/2017, 03/20/2018  . Influenza,inj,Quad PF,6+ Mos 04/30/2014, 05/05/2015, 03/13/2016  . PFIZER SARS-COV-2 Vaccination 07/17/2019, 08/11/2019  . Plague 03/28/1966  . Pneumococcal Conjugate-13 05/11/2013  . Pneumococcal Polysaccharide-23 04/30/2014  . Td 04/28/2012  . Typhoid Parenteral 01/27/1965, 08/08/2009    Plan  Recommended patient receive shingles vaccine in pharmacy.    Medication Management   . OTC's: Vitamin D3 2000u daily, L-arginine 500mg  daily, magnesium 500mg  daily, Centrum silver daily, Vit b-12 1000 mcg daily, omega-3 1000mg  daily, zinc 50mg  daily . Patient currently uses [facility] .  Phone #  [phone] o No issues, patient happy with service and has his own methods for organizing meds. . Patient reports using pill box method to organize medications and  promote adherence. . Patient denies missed doses of medication.   Beverly Milch, PharmD Clinical Pharmacist [facility] [phone]

## 2019-09-17 DIAGNOSIS — M48061 Spinal stenosis, lumbar region without neurogenic claudication: Secondary | ICD-10-CM | POA: Diagnosis not present

## 2019-09-30 ENCOUNTER — Other Ambulatory Visit: Payer: Self-pay | Admitting: Family Medicine

## 2019-09-30 MED ORDER — ALLOPURINOL 100 MG PO TABS: 200.0000 mg | ORAL_TABLET | Freq: Every day | ORAL | 3 refills | Status: DC

## 2019-10-01 DIAGNOSIS — M48061 Spinal stenosis, lumbar region without neurogenic claudication: Secondary | ICD-10-CM | POA: Diagnosis not present

## 2019-10-01 DIAGNOSIS — M4316 Spondylolisthesis, lumbar region: Secondary | ICD-10-CM | POA: Diagnosis not present

## 2019-10-14 ENCOUNTER — Ambulatory Visit: Payer: Self-pay | Admitting: Pharmacist

## 2019-10-14 NOTE — Chronic Care Management (AMB) (Addendum)
Chronic Care Management Pharmacy  Name: Chad Serrano  MRN: [id number] DOB: 1946-04-27   Chief Complaint/ HPI Chad Serrano is a 74 y.o. year old male who is a primary care patient of Pickard, Cammie Mcgee, MD. The CCM team was consulted for assistance with chronic disease management and care coordination needs.    Review of patient status, including review of consultants reports, relevant laboratory and other test results, and collaboration with appropriate care team members and the patient's provider was performed as part of comprehensive patient evaluation and provision of chronic care management services.    Medications: Outpatient Encounter Medications as of 09/16/2019  Medication Sig   allopurinol (ZYLOPRIM) 100 MG tablet TAKE 2 TABLETS BY MOUTH EVERY DAY   atorvastatin (LIPITOR) 40 MG tablet TAKE 1 TABLET (40 MG TOTAL) BY MOUTH DAILY AT 6 PM.   brimonidine (ALPHAGAN) 0.15 % ophthalmic solution Place 1 drop into the right eye 2 (two) times daily.   Cholecalciferol (VITAMIN D3) 2000 UNITS TABS Take 2,000 Units by mouth daily.    clopidogrel (PLAVIX) 75 MG tablet Take 1 tablet (75 mg total) by mouth daily.   L-Arginine 500 MG TABS Take 500 mg by mouth daily.    losartan (COZAAR) 100 MG tablet TAKE 1 TABLET BY MOUTH EVERY DAY   Magnesium 500 MG CAPS Take 500 mg by mouth daily.    Multiple Vitamins-Minerals (CENTRUM SILVER ADULT 50+) TABS Take 1 tablet by mouth daily.    Omega-3 Fatty Acids (FISH OIL) 1000 MG CAPS Take 1,000 mg by mouth daily.    traMADol (ULTRAM) 50 MG tablet Take 50 mg by mouth every 12 (twelve) hours as needed for moderate pain.   vitamin B-12 (CYANOCOBALAMIN) 1000 MCG tablet Take 1,000 mcg by mouth daily.    Zinc 50 MG TABS Take 50 mg by mouth daily.    No facility-administered encounter medications on file as of 09/16/2019.   Follow up visit with CCM PharmD for blood pressure evaluation and follow up on antiplatelet therapy/pain.  Goals Addressed              This Visit's Progress    High Blood Pressure - Goal < 130/80       CARE PLAN ENTRY   Current Barriers:  Uncontrolled hypertension, complicated by hyperlipidemia, spinal stenosis. Current antihypertensive regimen: losartan 100mg  daily. Previous antihypertensives tried: none Last practice recorded BP readings:  BP Readings from Last 3 Encounters:  09/14/19 (!) 144/82  08/24/19 (!) 149/97  07/21/19 138/84   Current home BP readings: all WNL with new arm cuff.  [id number] - 130-68 reported range  Pharmacist Clinical Goal(s):  Over the next 180 days, patient will work with PharmD and providers to optimize antihypertensive regimen.  Interventions: Inter-disciplinary care team collaboration (see longitudinal plan of care) Comprehensive medication review performed; medication list updated in the electronic medical record.  Patient Self Care Activities:  Patient will continue to check BP once daily, document, and provide at future appointments Patient will focus on medication adherence by using a pill box. Over the next 180 days, patient will report any consistent reading > 130/80 to PharmD or PCP.  Please see past updates related to this goal by clicking on the "Past Updates" button in the selected goal       Retinal Artery Occlusion       CARE PLAN ENTRY  Current Barriers:  Chronic Disease Management support, education, and care coordination needs related to retinal artery occlusion.  Pharmacist Clinical Goal(s):  Over the next 180 days the patient will work with PharmD and providers to optimize antiplatelet therapy.  Interventions: Comprehensive medication review performed. Discussed signs/symptoms of abnormal bleeding/bruising.   Patient Self Care Activities:  Patient verbalizes understanding of plan to continue current therapy.   Over the next 180 days patient will focus on adherence with pill box. Over the next 180 days patient will monitor pain level and  report any changes to providers in order to optimize pain regimen as it associates antiplatelet therapy.   Please see past updates related to this goal by clicking on the "Past Updates" button in the selected goal          Current Diagnosis/Assessment:   Hypertension   Office blood pressures are  BP Readings from Last 3 Encounters:  09/14/19 (!) 144/82  08/24/19 (!) 149/97  07/21/19 138/84    Patient has failed these meds in the past: none noted  Patient checks BP at home at least once daily and records in log.  Patient home BP readings are ranging: 130/68, 123/74, 124/68, 124/69, 116/67, 116/63, 128/71, 119/56  Patient is currently controlled on the following medications: losartan 100mg  daily   Patient did purchase arm cuff as discussed at first visit.  Seems to be that his elevated blood pressures in office are outliers and is controlled at home when he checks.  Reports no dizziness, or chest pain.  Plan  Continue current meds. Contact PCP or PharmD if consistent BP >130/80.    Retinal Artery Occlusion/Cryptogenic stroke    Patient has failed these meds in past: none noted Patient is currently controlled on the following medications: Plavix 75mg  once daily, brimonidine 0.15% 1 drop in the right eye twice daily  Patient still taking Plavix and did report some bruising/bleeding.  Counseled on when to call PCP if bleeding does not stop.  Plan  Continue current medications.  Will consider gabapentin for pain regimen so patient does not have to keep stopping plavix when he hs the steroid injections.  Beverly Milch, PharmD Clinical Pharmacist [facility] [phone] I have collaborated with the care management provider regarding care management and care coordination activities outlined in this encounter and have reviewed this encounter including documentation in the note and care plan. I am certifying that I agree with the content of this note and  encounter as supervising physician.

## 2019-10-14 NOTE — Patient Instructions (Addendum)
Good to talk to you again!  Glad things are going well.  Don't hesitate to call me if anything comes up!  Visit Information  Goals Addressed            This Visit's Progress   . High Blood Pressure - Goal < 130/80       CARE PLAN ENTRY   Current Barriers:  . Uncontrolled hypertension, complicated by hyperlipidemia, spinal stenosis. . Current antihypertensive regimen: losartan 100mg  daily. . Previous antihypertensives tried: none . Last practice recorded BP readings:  BP Readings from Last 3 Encounters:  09/14/19 (!) 144/82  08/24/19 (!) 149/97  07/21/19 138/84   . Current home BP readings: all WNL with new arm cuff.  [id number] - 130-68 reported range  Pharmacist Clinical Goal(s):  Marland Kitchen Over the next 180 days, patient will work with PharmD and providers to optimize antihypertensive regimen.  Interventions: . Inter-disciplinary care team collaboration (see longitudinal plan of care) . Comprehensive medication review performed; medication list updated in the electronic medical record.  Patient Self Care Activities:  . Patient will continue to check BP once daily, document, and provide at future appointments . Patient will focus on medication adherence by using a pill box. . Over the next 180 days, patient will report any consistent reading > 130/80 to PharmD or PCP.  Please see past updates related to this goal by clicking on the "Past Updates" button in the selected goal      . Retinal Artery Occlusion       CARE PLAN ENTRY  Current Barriers:  . 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  Pharmacist Clinical Goal(s):  Marland Kitchen Over the next 180 days the patient will work with PharmD and providers to optimize antiplatelet therapy.  Interventions: . Comprehensive medication review performed. . Discussed signs/symptoms of abnormal bleeding/bruising.   Patient Self Care Activities:  . Patient verbalizes  understanding of plan to continue current therapy.   . Over the next 180 days patient will focus on adherence with pill box. . Over the next 180 days patient will monitor pain level and report any changes to providers in order to optimize pain regimen as it associates antiplatelet therapy.   Please see past updates related to this goal by clicking on the "Past Updates" button in the selected goal         The patient verbalized understanding of instructions provided today and agreed to receive a mailed copy of patient instruction and/or educational materials.  Telephone follow up appointment with pharmacy team member scheduled for: 04/13/2020 @ 4:30 PM  Beverly Milch, PharmD Clinical Pharmacist [facility] [phone]   Preventing Hypertension Hypertension, commonly called high blood pressure, is when the force of blood pumping through the arteries is too strong. Arteries are blood vessels that carry blood from the heart throughout the body. Over time, hypertension can damage the arteries and decrease blood flow to important parts of the body, including the brain, heart, and kidneys. Often, hypertension does not cause symptoms until blood pressure is very high. For this reason, it is important to have your blood pressure checked on a regular basis. Hypertension can often be prevented with diet and lifestyle changes. If you already have hypertension, you can control it with diet and lifestyle changes, as well as medicine. What nutrition changes can be made? Maintain a healthy diet. This includes:  Eating less salt (sodium). Ask your health care provider how much sodium is  safe for you to have. The general recommendation is to consume less than 1 tsp (2,300 mg) of sodium a day. ? Do not add salt to your food. ? Choose low-sodium options when grocery shopping and eating out.  Limiting fats in your diet. You can do this by eating low-fat or fat-free dairy products and  by eating less red meat.  Eating more fruits, vegetables, and whole grains. Make a goal to eat: ? 1-2 cups of fresh fruits and vegetables each day. ? 3-4 servings of whole grains each day.  Avoiding foods and beverages that have added sugars.  Eating fish that contain healthy fats (omega-3 fatty acids), such as mackerel or salmon. If you need help putting together a healthy eating plan, try the DASH diet. This diet is high in fruits, vegetables, and whole grains. It is low in sodium, red meat, and added sugars. DASH stands for Dietary Approaches to Stop Hypertension. What lifestyle changes can be made?   Lose weight if you are overweight. Losing just 3?5% of your body weight can help prevent or control hypertension. ? For example, if your present weight is 200 lb (91 kg), a loss of 3-5% of your weight means losing 6-10 lb (2.7-4.5 kg). ? Ask your health care provider to help you with a diet and exercise plan to safely lose weight.  Get enough exercise. Do at least 150 minutes of moderate-intensity exercise each week. ? You could do this in short exercise sessions several times a day, or you could do longer exercise sessions a few times a week. For example, you could take a brisk 10-minute walk or bike ride, 3 times a day, for 5 days a week.  Find ways to reduce stress, such as exercising, meditating, listening to music, or taking a yoga class. If you need help reducing stress, ask your health care provider.  Do not smoke. This includes e-cigarettes. Chemicals in tobacco and nicotine products raise your blood pressure each time you smoke. If you need help quitting, ask your health care provider.  Avoid alcohol. If you drink alcohol, limit alcohol intake to no more than 1 drink a day for nonpregnant women and 2 drinks a day for men. One drink equals 12 oz of beer, 5 oz of wine, or 1 oz of hard liquor. Why are these changes important? Diet and lifestyle changes can help you prevent  hypertension, and they may make you feel better overall and improve your quality of life. If you have hypertension, making these changes will help you control it and help prevent major complications, such as:  Hardening and narrowing of arteries that supply blood to: ? Your heart. This can cause a heart attack. ? Your brain. This can cause a stroke. ? Your kidneys. This can cause kidney failure.  Stress on your heart muscle, which can cause heart failure. What can I do to lower my risk?  Work with your health care provider to make a hypertension prevention plan that works for you. Follow your plan and keep all follow-up visits as told by your health care provider.  Learn how to check your blood pressure at home. Make sure that you know your personal target blood pressure, as told by your health care provider. How is this treated? In addition to diet and lifestyle changes, your health care provider may recommend medicines to help lower your blood pressure. You may need to try a few different medicines to find what works best for you. You also may  need to take more than one medicine. Take over-the-counter and prescription medicines only as told by your health care provider. Where to find support Your health care provider can help you prevent hypertension and help you keep your blood pressure at a healthy level. Your local hospital or your community may also provide support services and prevention programs. The American Heart Association offers an online support network at: CheapBootlegs.com.cy Where to find more information Learn more about hypertension from:  [place], [facility], and Blood Institute: ElectronicHangman.is  Centers for Disease Control and Prevention: https://ingram.com/  American Academy of Family Physicians:  http://familydoctor.org/familydoctor/en/diseases-conditions/high-blood-pressure.printerview.all.html Learn more about the DASH diet from:  [place], and [place]: https://www.reyes.com/ Contact a health care provider if:  You think you are having a reaction to medicines you have taken.  You have recurrent headaches or feel dizzy.  You have swelling in your ankles.  You have trouble with your vision. Summary  Hypertension often does not cause any symptoms until blood pressure is very high. It is important to get your blood pressure checked regularly.  Diet and lifestyle changes are the most important steps in preventing hypertension.  By keeping your blood pressure in a healthy range, you can prevent complications like heart attack, heart failure, stroke, and kidney failure.  Work with your health care provider to make a hypertension prevention plan that works for you. This information is not intended to replace advice given to you by your health care provider. Make sure you discuss any questions you have with your health care provider. Document Revised: 08/29/2018 Document Reviewed: 01/16/2016 Elsevier Patient Education  2020 [employer].

## 2019-11-02 DIAGNOSIS — H35033 Hypertensive retinopathy, bilateral: Secondary | ICD-10-CM | POA: Diagnosis not present

## 2019-11-02 DIAGNOSIS — H43823 Vitreomacular adhesion, bilateral: Secondary | ICD-10-CM | POA: Diagnosis not present

## 2019-11-02 DIAGNOSIS — H3581 Retinal edema: Secondary | ICD-10-CM | POA: Diagnosis not present

## 2019-11-02 DIAGNOSIS — H3411 Central retinal artery occlusion, right eye: Secondary | ICD-10-CM | POA: Diagnosis not present

## 2019-12-21 DIAGNOSIS — M48061 Spinal stenosis, lumbar region without neurogenic claudication: Secondary | ICD-10-CM | POA: Diagnosis not present

## 2020-01-04 DIAGNOSIS — C4359 Malignant melanoma of other part of trunk: Secondary | ICD-10-CM | POA: Diagnosis not present

## 2020-01-19 DIAGNOSIS — M4316 Spondylolisthesis, lumbar region: Secondary | ICD-10-CM | POA: Diagnosis not present

## 2020-01-19 DIAGNOSIS — M48061 Spinal stenosis, lumbar region without neurogenic claudication: Secondary | ICD-10-CM | POA: Diagnosis not present

## 2020-01-20 ENCOUNTER — Other Ambulatory Visit: Payer: Self-pay

## 2020-01-20 MED ORDER — LOSARTAN POTASSIUM 100 MG PO TABS: 100.0000 mg | ORAL_TABLET | Freq: Every day | ORAL | 2 refills | Status: DC

## 2020-02-15 DIAGNOSIS — L570 Actinic keratosis: Secondary | ICD-10-CM | POA: Diagnosis not present

## 2020-02-15 DIAGNOSIS — D485 Neoplasm of uncertain behavior of skin: Secondary | ICD-10-CM | POA: Diagnosis not present

## 2020-02-15 DIAGNOSIS — Z85820 Personal history of malignant melanoma of skin: Secondary | ICD-10-CM | POA: Diagnosis not present

## 2020-02-24 ENCOUNTER — Encounter: Payer: Self-pay | Admitting: Adult Health

## 2020-02-24 ENCOUNTER — Other Ambulatory Visit: Payer: Self-pay

## 2020-02-24 ENCOUNTER — Ambulatory Visit (INDEPENDENT_AMBULATORY_CARE_PROVIDER_SITE_OTHER): Payer: Medicare Other | Admitting: Adult Health

## 2020-02-24 VITALS — BP 142/84 | HR 74 | Ht 71.0 in | Wt 218.0 lb

## 2020-02-24 DIAGNOSIS — H3411 Central retinal artery occlusion, right eye: Secondary | ICD-10-CM

## 2020-02-24 DIAGNOSIS — H5440 Blindness, one eye, unspecified eye: Secondary | ICD-10-CM

## 2020-02-24 NOTE — Patient Instructions (Signed)
Continue clopidogrel 75 mg daily  and atorvastatin for secondary stroke prevention  Continue to follow up with PCP regarding cholesterol and blood pressure management  Maintain strict control of hypertension with blood pressure goal below 130/90 and cholesterol with LDL cholesterol (bad cholesterol) goal below 70 mg/dL.      Followup in the future with me in 6 months or call earlier if needed       Thank you for coming to see [place] at [facility]. I hope we have been able to provide you high quality care today.  You may receive a patient satisfaction survey over the next few weeks. We would appreciate your feedback and comments so that we may continue to improve ourselves and the health of our patients.

## 2020-02-24 NOTE — Progress Notes (Signed)
[facility] [address] [place]. [place] [address] [phone]       OFFICE FOLLOW-UP NOTE  [name] Date of Birth:  March 21, 1946 Medical Record Number:  [id number]    Chief Complaint  Patient presents with  . Follow-up    tx rm. Pt has no new sx.  . Eye Problem     HPI:   Today, 02/24/2020, Chad Serrano returns for follow-up regarding CRAO in 05/2019.  Continues to experience right eye visual loss which has been stable without worsening. Minimal vision at periphery but very limited.  Recent visit with ophthalmology with stable exam and plans on revisit in January.  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  Denies new stroke/TIA symptoms.  Remains on Plavix and atorvastatin 40 mg daily for secondary stroke prevention.  Does report bruising on Plavix but denies bleeding.  Blood pressure today 142/84. Monitors at home daily which has been stable - this AM 126/74. No further concerns at this time.      History provided for reference purposes only Initial visit 08/24/2019 Dr. Leonie Man: Chad Serrano is a 74 year old pleasant Caucasian male seen today for initial office follow-up visit following hospital consultation for vision loss in January 2021.  History is obtained from the patient, review of electronic medical records and I personally reviewed imaging films in PACS.  Patient developed sudden onset of right eye vision loss on New Year's Eve last year.  He was seen by ophthalmologist who noticed blocked arteries and referred him to [facility] for outpatient work-up for embolic stroke.  His vision did not improve except he had a little bit of light perception in the right eye temporal field only.  He denied any eye pain injury.  He denied any accompanying stroke symptoms in the form of slurred speech, dizziness, gait ataxia extremity weakness or numbness.  He had no prior history of strokes or TIAs.  He denies any prior history of  temporal headaches, scalp tenderness or jaw claudication.  His work-up in the hospital showed ESR to be normal at 2 mm, C-reactive protein was nondetectable.  CT scan of the brain was unremarkable and CT angiogram showed mild carotid bifurcation atherosclerosis without large vessel stenosis or occlusion.  MRI scan showed no acute infarct only changes of age-appropriate small vessel disease.  Transthoracic echo showed normal ejection fraction and no cardiac source of embolism.  There was a small mobile echodensity in the right atrium which was unclear significance.  LDL cholesterol was elevated 135 mg percent and hemoglobin A1c 5.6.  Patient had no history of atrial fibrillation and cardiac monitoring in the hospital was unremarkable.  Loop recorder was suggested but the patient refused.  He subsequently has had 30-day external heart monitor done on 07/07/2019 which showed no evidence of cardiac arrhythmias.  He is still not decided whether he wants to undergo loop recorder.  He is right eye vision loss appears to still be nearly complete with only mild light perception.  He was seen by eye doctor recently who noticed no significant improvement.  Patient has been able to drive but has to be careful when he turns.  He has had no accidents.  He has no complaints today.  He did tolerate aspirin Plavix well and currently stop aspirin and is on Plavix alone which is tolerating well without bruising or bleeding.  Is tolerating Lipitor well without muscle aches and pains.  He has an upcoming appointment with his primary care  physician to check his follow-up lipid profile.  He has started eating healthy and he does exercise regularly and he knows he needs to lose some weight.     ROS:   14 system review of systems is positive for vision loss in the right eye only and all other systems negative PMH:  Past Medical History:  Diagnosis Date  . Anxiety   . Chronic kidney disease   . Gout   . Hyperlipidemia   .  Hypertension   . Hypogonadism male   . Hypogonadism male   . Melanoma ([place])    at T4 surgically excised at [facility] (2020) free margins and sentinel node biopsy negative  . Melanoma ([place])   . Stroke [facility])    cryptogenic, right retinal artery occlusion    Social History:  Social History   Socioeconomic History  . Marital status: Married    Spouse name: Not on file  . Number of children: Not on file  . Years of education: Not on file  . Highest education level: Not on file  Occupational History  . Not on file  Tobacco Use  . Smoking status: Never Smoker  . Smokeless tobacco: Never Used  Substance and Sexual Activity  . Alcohol use: No  . Drug use: No  . Sexual activity: Not on file    Comment: married to Santo.  Retired.  Weightlifter  Other Topics Concern  . Not on file  Social History Narrative  . Not on file   Social Determinants of Health   Financial Resource Strain: Low Risk   . Difficulty of Paying Living Expenses: Not hard at all  Food Insecurity:   . Worried About Charity fundraiser in the Last Year: Not on file  . Ran Out of Food in the Last Year: Not on file  Transportation Needs:   . Lack of Transportation (Medical): Not on file  . Lack of Transportation (Non-Medical): Not on file  Physical Activity:   . Days of Exercise per Week: Not on file  . Minutes of Exercise per Session: Not on file  Stress:   . Feeling of Stress : Not on file  Social Connections:   . Frequency of Communication with Friends and Family: Not on file  . Frequency of Social Gatherings with Friends and Family: Not on file  . Attends Religious Services: Not on file  . Active Member of Clubs or Organizations: Not on file  . Attends Archivist Meetings: Not on file  . Marital Status: Not on file  Intimate Partner Violence:   . Fear of Current or Ex-Partner: Not on file  . Emotionally Abused: Not on file  . Physically Abused: Not on file  . Sexually Abused: Not on file     Medications:   Current Outpatient Medications on File Prior to Visit  Medication Sig Dispense Refill  . allopurinol (ZYLOPRIM) 100 MG tablet Take 2 tablets (200 mg total) by mouth daily. 180 tablet 3  . atorvastatin (LIPITOR) 40 MG tablet TAKE 1 TABLET (40 MG TOTAL) BY MOUTH DAILY AT 6 PM. 90 tablet 2  . brimonidine (ALPHAGAN) 0.15 % ophthalmic solution Place 1 drop into the right eye 2 (two) times daily.    . Cholecalciferol (VITAMIN D3) 2000 UNITS TABS Take 2,000 Units by mouth daily.     . clopidogrel (PLAVIX) 75 MG tablet Take 1 tablet (75 mg total) by mouth daily. 30 tablet 11  . L-Arginine 500 MG TABS Take 500 mg by  mouth daily.     Marland Kitchen losartan (COZAAR) 100 MG tablet Take 1 tablet (100 mg total) by mouth daily. 90 tablet 2  . Magnesium 500 MG CAPS Take 500 mg by mouth daily.     . Multiple Vitamins-Minerals (CENTRUM SILVER ADULT 50+) TABS Take 1 tablet by mouth daily.     . Omega-3 Fatty Acids (FISH OIL) 1000 MG CAPS Take 2,000 mg by mouth daily.     . traMADol (ULTRAM) 50 MG tablet Take 50 mg by mouth every 12 (twelve) hours as needed for moderate pain.    . vitamin B-12 (CYANOCOBALAMIN) 1000 MCG tablet Take 1,000 mcg by mouth daily.     . Zinc 50 MG TABS Take 50 mg by mouth daily.      No current facility-administered medications on file prior to visit.    Allergies:  No Known Allergies  Physical Exam Today's Vitals   02/24/20 1328  BP: (!) 142/84  Pulse: 74  Weight: 218 lb (98.9 kg)  Height: 5' 11" (1.803 m)   Body mass index is 30.4 kg/m.  General: Mildly obese pleasant elderly Caucasian male seated, in no evident distress Head: head normocephalic and atraumatic.  Neck: supple with no carotid or supraclavicular bruits Cardiovascular: regular rate and rhythm, no murmurs Musculoskeletal: no deformity Skin:  no rash/petichiae Vascular:  Normal pulses all extremities  Neurologic Exam Mental Status: Awake and fully alert.  Fluent speech and language.  Oriented to  place and time. Recent and remote memory intact. Attention span, concentration and fund of knowledge appropriate. Mood and affect appropriate.  Cranial Nerves: Vision acuity significantly impaired in the right eye with only light perception in crescentic portion of the temporal field in the right eye.  Left pupil and eye are normal. Extraocular movements full without nystagmus. Visual fields full to confrontation. Hearing intact. Facial sensation intact. Face, tongue, palate moves normally and symmetrically.  Motor: Normal bulk and tone. Normal strength in all tested extremity muscles. Sensory.: intact to touch ,pinprick .position and vibratory sensation.  Coordination: Rapid alternating movements normal in all extremities. Finger-to-nose and heel-to-shin performed accurately bilaterally. Gait and Station: Arises from chair without difficulty. Stance is normal. Gait demonstrates normal stride length and balance . Able to heel, toe and tandem walk without difficulty.  Reflexes: 1+ and symmetric. Toes downgoing.      ASSESSMENT/PLAN: 74 year old Caucasian male with sudden onset of painless right eye vision loss in December 2020 secondary to central retinal artery occlusion.  Vascular risk factors of hypertension, hyperlipidemia, age, mild obesity and carotid atherosclerosis   CRAO -Continue Plavix 75 mg daily and atorvastatin 40 mg daily for secondary stroke prevention -Discussed secondary stroke prevention measures and importance of close PCP follow-up to maintain strict control of hypertension with blood pressure goal below 130/90 and lipids with LDL cholesterol goal below 70 mg/dL.  -Continue to follow with ophthalmology for routine monitoring management   Follow-up in 6 months or call earlier if needed   I spent 25 minutes of face-to-face and non-face-to-face time with patient.  This included previsit chart review, lab review, study review, order entry, electronic health record  documentation, patient education and discussion regarding CRAO with residual visual loss, importance of managing stroke risk factors and answered all questions to patients satisfaction   Frann Rider, [facility]  [facility] [address] [place], [place] [address]  Phone [phone] Fax [phone] Note: This document was prepared with digital dictation and possible smart phrase technology. Any transcriptional errors that  result from this process are unintentional.

## 2020-02-24 NOTE — Progress Notes (Signed)
I agree with the above plan 

## 2020-03-08 DIAGNOSIS — L720 Epidermal cyst: Secondary | ICD-10-CM | POA: Diagnosis not present

## 2020-03-08 DIAGNOSIS — D225 Melanocytic nevi of trunk: Secondary | ICD-10-CM | POA: Diagnosis not present

## 2020-03-08 DIAGNOSIS — Z85820 Personal history of malignant melanoma of skin: Secondary | ICD-10-CM | POA: Diagnosis not present

## 2020-03-08 DIAGNOSIS — L821 Other seborrheic keratosis: Secondary | ICD-10-CM | POA: Diagnosis not present

## 2020-03-08 DIAGNOSIS — L812 Freckles: Secondary | ICD-10-CM | POA: Diagnosis not present

## 2020-03-24 DIAGNOSIS — M48061 Spinal stenosis, lumbar region without neurogenic claudication: Secondary | ICD-10-CM | POA: Diagnosis not present

## 2020-04-01 ENCOUNTER — Other Ambulatory Visit: Payer: Self-pay | Admitting: Family Medicine

## 2020-04-11 DIAGNOSIS — M48061 Spinal stenosis, lumbar region without neurogenic claudication: Secondary | ICD-10-CM | POA: Diagnosis not present

## 2020-04-11 DIAGNOSIS — M4316 Spondylolisthesis, lumbar region: Secondary | ICD-10-CM | POA: Diagnosis not present

## 2020-04-13 ENCOUNTER — Telehealth: Payer: Self-pay

## 2020-04-26 DIAGNOSIS — H3411 Central retinal artery occlusion, right eye: Secondary | ICD-10-CM | POA: Diagnosis not present

## 2020-04-26 DIAGNOSIS — H5203 Hypermetropia, bilateral: Secondary | ICD-10-CM | POA: Diagnosis not present

## 2020-04-26 DIAGNOSIS — H52222 Regular astigmatism, left eye: Secondary | ICD-10-CM | POA: Diagnosis not present

## 2020-04-26 DIAGNOSIS — H2513 Age-related nuclear cataract, bilateral: Secondary | ICD-10-CM | POA: Diagnosis not present

## 2020-04-26 DIAGNOSIS — H524 Presbyopia: Secondary | ICD-10-CM | POA: Diagnosis not present

## 2020-04-28 ENCOUNTER — Other Ambulatory Visit: Payer: Self-pay

## 2020-04-28 ENCOUNTER — Emergency Department (HOSPITAL_COMMUNITY): Payer: Medicare Other

## 2020-04-28 ENCOUNTER — Encounter (HOSPITAL_COMMUNITY): Payer: Self-pay

## 2020-04-28 ENCOUNTER — Emergency Department (HOSPITAL_COMMUNITY)
Admission: EM | Admit: 2020-04-28 | Discharge: 2020-04-28 | Disposition: A | Discharge status: Home / Self Care | Payer: Medicare Other | Attending: Emergency Medicine | Admitting: Emergency Medicine

## 2020-04-28 DIAGNOSIS — N189 Chronic kidney disease, unspecified: Secondary | ICD-10-CM | POA: Diagnosis not present

## 2020-04-28 DIAGNOSIS — Z79899 Other long term (current) drug therapy: Secondary | ICD-10-CM | POA: Diagnosis not present

## 2020-04-28 DIAGNOSIS — K8020 Calculus of gallbladder without cholecystitis without obstruction: Secondary | ICD-10-CM | POA: Diagnosis not present

## 2020-04-28 DIAGNOSIS — Z7901 Long term (current) use of anticoagulants: Secondary | ICD-10-CM | POA: Diagnosis not present

## 2020-04-28 DIAGNOSIS — I129 Hypertensive chronic kidney disease with stage 1 through stage 4 chronic kidney disease, or unspecified chronic kidney disease: Secondary | ICD-10-CM | POA: Diagnosis not present

## 2020-04-28 DIAGNOSIS — R140 Abdominal distension (gaseous): Secondary | ICD-10-CM | POA: Diagnosis not present

## 2020-04-28 DIAGNOSIS — R52 Pain, unspecified: Secondary | ICD-10-CM | POA: Diagnosis not present

## 2020-04-28 DIAGNOSIS — R109 Unspecified abdominal pain: Secondary | ICD-10-CM | POA: Diagnosis not present

## 2020-04-28 DIAGNOSIS — R1084 Generalized abdominal pain: Secondary | ICD-10-CM | POA: Diagnosis not present

## 2020-04-28 LAB — COMPREHENSIVE METABOLIC PANEL
ALT: 30 U/L (ref 0–44)
AST: 32 U/L (ref 15–41)
Albumin: 3.6 g/dL (ref 3.5–5.0)
Alkaline Phosphatase: 71 U/L (ref 38–126)
Anion gap: 10 (ref 5–15)
BUN: 12 mg/dL (ref 8–23)
CO2: 24 mmol/L (ref 22–32)
Calcium: 8.6 mg/dL — ABNORMAL LOW (ref 8.9–10.3)
Chloride: 104 mmol/L (ref 98–111)
Creatinine, Ser: 1.14 mg/dL (ref 0.61–1.24)
GFR, Estimated: >60 mL/min (ref >60)
Glucose, Bld: 149 mg/dL — ABNORMAL HIGH (ref 70–99)
Potassium: 4.2 mmol/L (ref 3.5–5.1)
Sodium: 138 mmol/L (ref 135–145)
Total Bilirubin: 1 mg/dL (ref 0.3–1.2)
Total Protein: 6.1 g/dL — ABNORMAL LOW (ref 6.5–8.1)

## 2020-04-28 LAB — URINALYSIS, ROUTINE W REFLEX MICROSCOPIC
Bilirubin Urine: NEGATIVE
Glucose, UA: NEGATIVE mg/dL
Hgb urine dipstick: NEGATIVE
Ketones, ur: 5 mg/dL — AB
Leukocytes,Ua: NEGATIVE
Nitrite: NEGATIVE
Protein, ur: NEGATIVE mg/dL
Specific Gravity, Urine: 1.02 (ref 1.005–1.030)
pH: 5 (ref 5.0–8.0)

## 2020-04-28 LAB — CBC
HCT: 39.9 % (ref 39.0–52.0)
Hemoglobin: 13.8 g/dL (ref 13.0–17.0)
MCH: 33.7 pg (ref 26.0–34.0)
MCHC: 34.6 g/dL (ref 30.0–36.0)
MCV: 97.6 fL (ref 80.0–100.0)
Platelets: 165 10*3/uL (ref 150–400)
RBC: 4.09 MIL/uL — ABNORMAL LOW (ref 4.22–5.81)
RDW: 12.4 % (ref 11.5–15.5)
WBC: 8.6 10*3/uL (ref 4.0–10.5)
nRBC: 0 % (ref 0.0–0.2)

## 2020-04-28 LAB — LIPASE, BLOOD: Lipase: 24 U/L (ref 11–51)

## 2020-04-28 MED ORDER — IOHEXOL 300 MG/ML  SOLN
100.0000 mL | Freq: Once | INTRAMUSCULAR | Status: AC | PRN
  Administered 2020-04-28: 100 mL via INTRAVENOUS

## 2020-04-28 MED ORDER — HYDROCODONE-ACETAMINOPHEN 5-325 MG PO TABS: 1.0000 | ORAL_TABLET | Freq: Four times a day (QID) | ORAL | 0 refills | Status: DC | PRN

## 2020-04-28 NOTE — Discharge Instructions (Signed)
Call [facility] surgery for appointment.  Symptoms seem to be related to gallstones.  Return for abdominal pain lasting for hours or longer.  Avoid fatty foods.

## 2020-04-28 NOTE — ED Triage Notes (Signed)
Patient arrived by [facility] complaining of generalized abdominal pain with radiation to bilateral flank. States last BM yesterday. No nausea, no vomiting. Patient complains of feeling bloated and used fleets with no relief.

## 2020-04-28 NOTE — ED Notes (Signed)
Patient verbalizes understanding of discharge instructions. Opportunity for questioning and answers were provided. Arm band removed by staff, patient discharged from ED. 

## 2020-04-28 NOTE — ED Provider Notes (Signed)
[facility] EMERGENCY DEPARTMENT Provider Note   CSN: [id number] Arrival date & time: 04/28/20  3810     History No chief complaint on file.   Chad Serrano is a 74 y.o. male.  Patient brought in by EMS complaining of generalized abdominal pain.  States that went to bed around 10 PM pain started around 11 PM.  It was, more so in the upper part of the abdomen is to go through to the back.  No nausea no vomiting.  It was very uncomfortable pain.  He is feeling as if he is bloated and is wondering if he has constipation.  No history of similar pain        Past Medical History:  Diagnosis Date  . Anxiety   . Chronic kidney disease   . Gout   . Hyperlipidemia   . Hypertension   . Hypogonadism male   . Hypogonadism male   . Melanoma ([place])    at T4 surgically excised at [facility] (2020) free margins and sentinel node biopsy negative  . Melanoma ([place])   . Stroke [facility])    cryptogenic, right retinal artery occlusion    Patient Active Problem List   Diagnosis Date Noted  . Stroke ([place])   . Loss of vision 05/26/2019  . Cryptogenic stroke ([place]) 05/26/2019  . 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 Melanoma ([place])   . Pulmonary infiltrates 10/02/2018  . Hypogonadism male 08/05/2012  . Other and unspecified hyperlipidemia 08/05/2012  . Hypertension   . Gout     Past Surgical History:  Procedure Laterality Date  . APPENDECTOMY    . COLONOSCOPY    . ROTATOR CUFF REPAIR Right        Family History  Problem Relation Age of Onset  . Colon polyps Father   . Colon cancer Neg Hx   . Esophageal cancer Neg Hx   . Rectal cancer Neg Hx   . Stomach cancer Neg Hx     Social History   Tobacco Use  . Smoking status: Never Smoker  . Smokeless tobacco: Never Used  Substance Use Topics  . Alcohol use: No  . Drug use: No    Home Medications Prior to Admission medications   Medication Sig Start Date End Date Taking? Authorizing Provider  allopurinol  (ZYLOPRIM) 100 MG tablet Take 2 tablets (200 mg total) by mouth daily. 09/30/19   Susy Frizzle, MD  atorvastatin (LIPITOR) 40 MG tablet TAKE 1 TABLET BY MOUTH DAILY AT 6 PM. 04/01/20   Susy Frizzle, MD  brimonidine (ALPHAGAN) 0.15 % ophthalmic solution Place 1 drop into the right eye 2 (two) times daily. 06/24/19   [provider]  Cholecalciferol (VITAMIN D3) 2000 UNITS TABS Take 2,000 Units by mouth daily.     [provider]  clopidogrel (PLAVIX) 75 MG tablet Take 1 tablet (75 mg total) by mouth daily. 05/26/19 05/25/20  Radene Gunning, NP  L-Arginine 500 MG TABS Take 500 mg by mouth daily.     [provider]  losartan (COZAAR) 100 MG tablet Take 1 tablet (100 mg total) by mouth daily. 01/20/20   Susy Frizzle, MD  Magnesium 500 MG CAPS Take 500 mg by mouth daily.     [provider]  Multiple Vitamins-Minerals (CENTRUM SILVER ADULT 50+) TABS Take 1 tablet by mouth daily.     [provider]  Omega-3 Fatty Acids (FISH OIL) 1000 MG CAPS Take 2,000 mg by mouth daily.  [provider]  traMADol (ULTRAM) 50 MG tablet Take 50 mg by mouth every 12 (twelve) hours as needed for moderate pain.    [provider]  vitamin B-12 (CYANOCOBALAMIN) 1000 MCG tablet Take 1,000 mcg by mouth daily.     [provider]  Zinc 50 MG TABS Take 50 mg by mouth daily.     [provider]    Allergies    Patient has no known allergies.  Review of Systems   Review of Systems  Constitutional: Negative for chills and fever.  HENT: Negative for rhinorrhea and sore throat.   Eyes: Negative for visual disturbance.  Respiratory: Negative for cough and shortness of breath.   Cardiovascular: Negative for chest pain and leg swelling.  Gastrointestinal: Positive for abdominal pain. Negative for diarrhea, nausea and vomiting.  Genitourinary: Negative for dysuria.  Musculoskeletal: Positive for back pain. Negative for neck pain.  Skin:  Negative for rash.  Neurological: Negative for dizziness, light-headedness and headaches.  Hematological: Does not bruise/bleed easily.  Psychiatric/Behavioral: Negative for confusion.    Physical Exam Updated Vital Signs BP 128/79 (BP Location: Right Arm)   Pulse 86   Temp 98.6 F (37 C) (Oral)   Resp 16   SpO2 97%   Physical Exam Vitals and nursing note reviewed.  Constitutional:      Appearance: Normal appearance. He is well-developed and well-nourished.  HENT:     Head: Normocephalic and atraumatic.  Eyes:     Extraocular Movements: Extraocular movements intact.     Conjunctiva/sclera: Conjunctivae normal.     Pupils: Pupils are equal, round, and reactive to light.  Cardiovascular:     Rate and Rhythm: Normal rate and regular rhythm.     Heart sounds: No murmur heard.   Pulmonary:     Effort: Pulmonary effort is normal. No respiratory distress.     Breath sounds: Normal breath sounds.  Abdominal:     General: There is no distension.     Palpations: Abdomen is soft.     Tenderness: There is no abdominal tenderness. There is no guarding.  Musculoskeletal:        General: No edema. Normal range of motion.     Cervical back: Normal range of motion and neck supple.  Skin:    General: Skin is warm and dry.     Capillary Refill: Capillary refill takes less than 2 seconds.  Neurological:     General: No focal deficit present.     Mental Status: He is alert and oriented to person, place, and time.     Cranial Nerves: No cranial nerve deficit.     Sensory: No sensory deficit.     Motor: No weakness.  Psychiatric:        Mood and Affect: Mood and affect normal.     ED Results / Procedures / Treatments   Labs (all labs ordered are listed, but only abnormal results are displayed) Labs Reviewed  COMPREHENSIVE METABOLIC PANEL - Abnormal; Notable for the following components:      Result Value   Glucose, Bld 149 (*)    Calcium 8.6 (*)    Total Protein 6.1 (*)    All  other components within normal limits  CBC - Abnormal; Notable for the following components:   RBC 4.09 (*)    All other components within normal limits  URINALYSIS, ROUTINE W REFLEX MICROSCOPIC - Abnormal; Notable for the following components:   Ketones, ur 5 (*)    All  other components within normal limits  LIPASE, BLOOD    EKG None  Radiology No results found.  Procedures Procedures (including critical care time)  Medications Ordered in ED Medications - No data to display  ED Course  I have reviewed the triage vital signs and the nursing notes.  Pertinent labs & imaging results that were available during my care of the patient were reviewed by me and considered in my medical decision making (see chart for details).    MDM Rules/Calculators/A&P                          Work-up consistent with symptomatic cholelithiasis.  No complicating factors.  No evidence of biliary obstruction.  Patient pain had resolved by the time I saw him the pain had resolved.  And has not reoccurred.  Abdomen is soft and nontender.  T scan raise question of cholecystitis but patient's labs not really consistent with that so ultrasound abdomen was done shows gallstone.  Shows normal size common bile duct.  Negative Murphy sign.  Not consistent with acute cholecystitis.  But patient is showing signs of symptomatic cholelithiasis.  Will refer to general surgery.    Final Clinical Impression(s) / ED Diagnoses Final diagnoses:  None    Rx / DC Orders ED Discharge Orders    None       Fredia Sorrow, MD 04/28/20 1423

## 2020-04-28 NOTE — ED Notes (Signed)
Patient is gone to ct

## 2020-04-28 NOTE — ED Notes (Signed)
Hooked patient back up to the monitor patient is resting with call bell in reach 

## 2020-04-28 NOTE — ED Notes (Signed)
Pt taken to CT.

## 2020-04-28 NOTE — ED Notes (Signed)
Pt transported to US

## 2020-04-29 ENCOUNTER — Telehealth: Payer: Self-pay | Admitting: Family Medicine

## 2020-04-29 NOTE — Telephone Encounter (Signed)
Discharge from the hospital was told  to out reach with his primary doctor for a referral for his Gallstone  referral  to [facility] see Dr.Tusei concerning his gallstones

## 2020-05-03 ENCOUNTER — Other Ambulatory Visit: Payer: Self-pay | Admitting: Family Medicine

## 2020-05-03 ENCOUNTER — Telehealth: Payer: Self-pay | Admitting: Family Medicine

## 2020-05-03 DIAGNOSIS — K8020 Calculus of gallbladder without cholecystitis without obstruction: Secondary | ICD-10-CM

## 2020-05-03 NOTE — Telephone Encounter (Signed)
Patient calling requesting referral for his Gallstone  referral  to [facility] see Dr.Tusei  Pt callback [phone]

## 2020-05-03 NOTE — Telephone Encounter (Signed)
Patient aware records sent

## 2020-05-03 NOTE — Telephone Encounter (Signed)
Ok to send referral  

## 2020-05-03 NOTE — Telephone Encounter (Signed)
Please tell the patient I have ordered the referral.

## 2020-05-04 NOTE — Telephone Encounter (Signed)
[place] for referral to GI ?

## 2020-05-05 NOTE — Telephone Encounter (Signed)
Dr. Cato Mulligan office called him on 05-05-20 to make appointment

## 2020-05-05 NOTE — Telephone Encounter (Signed)
Chad Serrano has spoken with Dr. Gershon Crane office he has his appointment set.

## 2020-05-05 NOTE — Telephone Encounter (Signed)
Dr Gershon Crane is a Psychologist, sport and exercise.  I ordered the surgery consult on Tuesday.

## 2020-05-17 ENCOUNTER — Telehealth: Payer: Self-pay

## 2020-05-21 HISTORY — PX: GALLBLADDER SURGERY: SHX652

## 2020-06-03 ENCOUNTER — Ambulatory Visit: Payer: Self-pay | Admitting: Surgery

## 2020-06-03 DIAGNOSIS — K8010 Calculus of gallbladder with chronic cholecystitis without obstruction: Secondary | ICD-10-CM | POA: Diagnosis not present

## 2020-06-03 NOTE — H&P (Signed)
History of Present Illness Chad Serrano. Aasia Peavler MD; 06/03/2020 6:37 PM) The patient is a 75 year old male who presents for evaluation of gall stones. Referred by Dr. Jenna Luo for symptomatic gallstones  This is a 75 year old male on Plavix for TIA who presents after recent ED visit for acute upper abdominal pain. He had some "beans and franks" and shortly afterwards woke up with severe pain and distention. The pain lasted about 6 hours. He presented to the ED for evaluation. WBC and LFT's WNL. US showed a large gallstone with some borderline wall thickening. His symptoms have improved and he has had no further episodes. Normal BM.   His wife recently fell and has some orthopedic issues that require some extra assistance.  CLINICAL DATA: Acute abdominal pain.  EXAM: CT ABDOMEN AND PELVIS WITH CONTRAST  TECHNIQUE: Multidetector CT imaging of the abdomen and pelvis was performed using the standard protocol following bolus administration of intravenous contrast.  CONTRAST: 128mL OMNIPAQUE IOHEXOL 300 MG/ML SOLN  COMPARISON: None.  FINDINGS: Lower chest: The lung bases are clear of acute process. No pleural effusion or pulmonary lesions. The heart is normal in size. No pericardial effusion. The distal esophagus and aorta are unremarkable.  Hepatobiliary: No hepatic lesions or intrahepatic biliary dilatation. The gallbladder is mildly distended. It also demonstrates mild wall thickening and suspected mild inflammation with mucosal enhancement. I do not see any obvious gallstones but findings somewhat suspicious for acute cholecystitis. Recommend right upper quadrant ultrasound examination for further evaluation.  No common bile duct dilatation.  Pancreas: No mass, inflammation or ductal dilatation.  Spleen: Normal size. No focal lesions.  Adrenals/Urinary Tract: The adrenal glands and kidneys are unremarkable. Simple upper pole right renal cyst. No renal calculi or  hydronephrosis. The bladder is unremarkable.  Stomach/Bowel: The stomach, duodenum, small bowel and colon are grossly normal without oral contrast. No acute inflammatory process, mass lesion or obstructive findings. The terminal ileum is normal. The appendix is surgically absent.  Vascular/Lymphatic: Mild tortuosity and moderate atherosclerotic calcifications involving the abdominal aorta and iliac arteries. No aneurysm or dissection. The branch vessels are patent. The major venous structures are patent. No mesenteric or retroperitoneal mass or adenopathy.  Reproductive: Mild prostate gland enlargement. The seminal vesicles appear normal.  Other: No pelvic mass or adenopathy. No free pelvic fluid collections. No inguinal mass or adenopathy. No abdominal wall hernia or subcutaneous lesions.  Musculoskeletal: No acute bony findings. Moderate degenerative changes involving the spine.  IMPRESSION: 1. CT findings suspicious for acute cholecystitis. Recommend right upper quadrant ultrasound examination for further evaluation. 2. No other significant abdominal/pelvic findings, mass lesion or adenopathy. 3. Moderate atherosclerotic calcifications involving the abdominal aorta and iliac arteries.  Aortic Atherosclerosis (ICD10-I70.0).   Electronically Signed By: Marijo Sanes M.D. On: 04/28/2020 11:40  CLINICAL DATA: Abnormal CT.  EXAM: ULTRASOUND ABDOMEN LIMITED RIGHT UPPER QUADRANT  COMPARISON: CT 04/28/2020.  FINDINGS: Gallbladder:  Prominent 3.7 cm gallstone. Sludge is noted the gallbladder. Minimal prominence of the gallbladder wall at 2.6 mm. Negative Murphy sign.  Common bile duct:  Diameter: 3.5 mm  Liver:  No focal lesion identified. Within normal limits in parenchymal echogenicity. Portal vein is patent on color Doppler imaging with normal direction of blood flow towards the liver.  Other: None.  IMPRESSION: Prominent 3.7 cm gallstone. Sludge noted  in the gallbladder. Minimal prominence of the gallbladder wall at 2.6 mm. Negative Murphy sign. No biliary distention.   Electronically Signed By: Marcello Moores Register On: 04/28/2020 13:54  Problem List/Past Medical Rodman Key K. Quintarius Ferns, MD; 06/03/2020 6:37 PM) CHRONIC CHOLECYSTITIS WITH CALCULUS (K80.10)  Past Surgical History Malachi Bonds, CMA; 06/03/2020 9:46 AM) Appendectomy Colon Polyp Removal - Colonoscopy Shoulder Surgery Right.  Allergies Malachi Bonds, CMA; 06/03/2020 9:47 AM) No Known Drug Allergies [06/03/2020]:  Medication History Malachi Bonds, CMA; 06/03/2020 9:48 AM) HYDROcodone-Acetaminophen (5-325MG  Tablet, Oral) Active. Allopurinol (100MG  Tablet, Oral) Active. Atorvastatin Calcium (40MG  Tablet, Oral) Active. Brimonidine Tartrate (0.15% Solution, Ophthalmic) Active. Clopidogrel Bisulfate (75MG  Tablet, Oral) Active. Losartan Potassium (100MG  Tablet, Oral) Active. traMADol HCl (50MG  Tablet, Oral) Active. Centrum Silver Adult 50+ (Oral) Active. Fish Oil (1000MG  Capsule DR, Oral) Active. L-Arginine (500MG  Tablet, Oral) Active. Magnesium (500MG  Tablet, Oral) Active. Medications Reconciled  Social History Malachi Bonds, CMA; 06/03/2020 9:46 AM) Tobacco use Never smoker.  Other Problems Chad Serrano. Merland Holness, MD; 06/03/2020 6:37 PM) Back Pain High blood pressure Melanoma     Review of Systems (Chemira Jones CMA; 06/03/2020 9:46 AM) General Not Present- Appetite Loss, Chills, Fatigue, Fever, Night Sweats, Weight Gain and Weight Loss. Skin Present- Dryness. Not Present- Change in Wart/Mole, Hives, Jaundice, New Lesions, Non-Healing Wounds, Rash and Ulcer. HEENT Present- Wears glasses/contact lenses. Not Present- Earache, Hearing Loss, Hoarseness, Nose Bleed, Oral Ulcers, Ringing in the Ears, Seasonal Allergies, Sinus Pain, Sore Throat, Visual Disturbances and Yellow Eyes. Respiratory Present- Snoring. Not Present- Bloody sputum, Chronic Cough,  Difficulty Breathing and Wheezing. Breast Not Present- Breast Mass, Breast Pain, Nipple Discharge and Skin Changes. Cardiovascular Not Present- Chest Pain, Difficulty Breathing Lying Down, Leg Cramps, Palpitations, Rapid Heart Rate, Shortness of Breath and Swelling of Extremities. Gastrointestinal Present- Constipation. Not Present- Abdominal Pain, Bloating, Bloody Stool, Change in Bowel Habits, Chronic diarrhea, Difficulty Swallowing, Excessive gas, Gets full quickly at meals, Hemorrhoids, Indigestion, Nausea, Rectal Pain and Vomiting. Male Genitourinary Not Present- Blood in Urine, Change in Urinary Stream, Frequency, Impotence, Nocturia, Painful Urination, Urgency and Urine Leakage.  Vitals (Chemira Jones CMA; 06/03/2020 9:47 AM) 06/03/2020 9:46 AM Weight: 220.2 lb Height: 71in Body Surface Area: 2.2 m Body Mass Index: 30.71 kg/m  BP: 130/80(Sitting, Left Arm, Standard)        Physical Exam Rodman Key K. Denali Sharma MD; 06/03/2020 6:37 PM)  The physical exam findings are as follows: Note:Constitutional: WDWN in NAD, conversant, no obvious deformities; resting comfortably Eyes: Pupils equal, round; sclera anicteric; moist conjunctiva; no lid lag HENT: Oral mucosa moist; good dentition Neck: No masses palpated, trachea midline; no thyromegaly Lungs: CTA bilaterally; normal respiratory effort CV: Regular rate and rhythm; no murmurs; extremities well-perfused with no edema Abd: +bowel sounds, soft, non-tender, no palpable organomegaly; no palpable hernias Musc: Normal gait; no apparent clubbing or cyanosis in extremities Lymphatic: No palpable cervical or axillary lymphadenopathy Skin: Warm, dry; no sign of jaundice Psychiatric - alert and oriented x 4; calm mood and affect    Assessment & Plan Rodman Key K. Jaidin Richison MD; 06/03/2020 6:38 PM)  CHRONIC CHOLECYSTITIS WITH CALCULUS (K80.10)  Current Plans Schedule for Surgery - Laparoscopic cholecystectomy with intraoperative  cholangiogram. The surgical procedure has been discussed with the patient. Potential risks, benefits, alternative treatments, and expected outcomes have been explained. All of the patient's questions at this time have been answered. The likelihood of reaching the patient's treatment goal is good. The patient understand the proposed surgical procedure and wishes to proceed. Note:The patient will call us to schedule surgery depending on his wife's progress. He will maintain a lowfat diet.   Chad Serrano. Georgette Dover, MD, [facility] Surgery  General/ Trauma Surgery   06/03/2020  6:38 PM

## 2020-06-09 DIAGNOSIS — H3411 Central retinal artery occlusion, right eye: Secondary | ICD-10-CM | POA: Diagnosis not present

## 2020-06-09 DIAGNOSIS — H43822 Vitreomacular adhesion, left eye: Secondary | ICD-10-CM | POA: Diagnosis not present

## 2020-06-09 DIAGNOSIS — H35033 Hypertensive retinopathy, bilateral: Secondary | ICD-10-CM | POA: Diagnosis not present

## 2020-06-09 DIAGNOSIS — H3582 Retinal ischemia: Secondary | ICD-10-CM | POA: Diagnosis not present

## 2020-06-10 DIAGNOSIS — Z01818 Encounter for other preprocedural examination: Secondary | ICD-10-CM | POA: Diagnosis not present

## 2020-06-14 DIAGNOSIS — K811 Chronic cholecystitis: Secondary | ICD-10-CM | POA: Diagnosis not present

## 2020-06-14 DIAGNOSIS — K8012 Calculus of gallbladder with acute and chronic cholecystitis without obstruction: Secondary | ICD-10-CM | POA: Diagnosis not present

## 2020-06-14 DIAGNOSIS — K812 Acute cholecystitis with chronic cholecystitis: Secondary | ICD-10-CM | POA: Diagnosis not present

## 2020-06-21 ENCOUNTER — Telehealth: Payer: Self-pay | Admitting: Pharmacist

## 2020-06-21 NOTE — Progress Notes (Addendum)
Chronic Care Management Pharmacy Assistant   Name: Chad Serrano  MRN: [id number] DOB: 1945-09-09  Reason for Encounter: Disease State For HTN.  Patient Questions:  1.  Have you seen any other providers since your last visit? Yes.   2.  Any changes in your medicines or health? Yes.    PCP : Susy Frizzle, MD   Their chronic conditions include: Hypertension, spinal stenosis, hyperlipidemia.  Office Visits: None 10/14/19  Consults: 04/26/20 Optometry Sabra Heck, Edie No information given. 04/11/20 Neurosurgery Leonie Green. No information given. 03/24/20 Anesthesiology Reece Agar Injection given. No information given. 03/08/20 Dermatology Jarome Matin. No information given. 02/24/20 Neurology Frann Rider, NP. No medication changes.  02/15/20 Dermatology Jarome Matin shaving of epidermal or dermal lesions. No information given. 01/19/20 Neurosurgery Leonie Green. In information given. 01/04/20 Surgical Oncology F/U for Malignant Melanoma. Sch surgery on 07/04/20. No medication changes.  12/21/19 Anesthesiology Reece Agar For Spinal Stenosis , injection. No information given. 11/02/19 Ophthalmology Princess Bruins right eye procedure. No information given.  Hospital: 04/28/20 Fredia Sorrow, MD. For Abdominal pain/Gallbladder. STARTED Hydrocodone-Acetaminophen 5-325 mg 1 tablet every 6 hours, PRN.  Allergies:  No Known Allergies  Medications: Outpatient Encounter Medications as of 06/21/2020  Medication Sig   allopurinol (ZYLOPRIM) 100 MG tablet Take 2 tablets (200 mg total) by mouth daily.   atorvastatin (LIPITOR) 40 MG tablet TAKE 1 TABLET BY MOUTH DAILY AT 6 PM. (Patient taking differently: Take 40 mg by mouth every evening.)   brimonidine (ALPHAGAN) 0.15 % ophthalmic solution Place 1 drop into the right eye 2 (two) times daily.   Cholecalciferol (VITAMIN D3) 2000 UNITS TABS Take 2,000 Units by mouth daily.    HYDROcodone-acetaminophen  (NORCO/VICODIN) 5-325 MG tablet Take 1 tablet by mouth every 6 (six) hours as needed for moderate pain.   L-Arginine 500 MG TABS Take 500 mg by mouth daily.    losartan (COZAAR) 100 MG tablet Take 1 tablet (100 mg total) by mouth daily.   Magnesium 500 MG CAPS Take 500 mg by mouth daily.    Multiple Vitamins-Minerals (CENTRUM SILVER ADULT 50+) TABS Take 1 tablet by mouth daily.    Omega-3 Fatty Acids (FISH OIL) 1000 MG CAPS Take 2,000 mg by mouth daily.    traMADol (ULTRAM) 50 MG tablet Take 50 mg by mouth every 12 (twelve) hours as needed for moderate pain.   vitamin B-12 (CYANOCOBALAMIN) 1000 MCG tablet Take 1,000 mcg by mouth daily.    Zinc 50 MG TABS Take 50 mg by mouth daily.    No facility-administered encounter medications on file as of 06/21/2020.    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 Spoke with patient regarding BP  Recent Office Vitals: BP Readings from Last 3 Encounters:  04/28/20 131/80  02/24/20 (!) 142/84  09/14/19 (!) 144/82   Pulse Readings from Last 3 Encounters:  04/28/20 80  02/24/20 74  09/14/19 80    Wt Readings from Last 3 Encounters:  02/24/20 218 lb (98.9 kg)  09/14/19 218 lb (98.9 kg)  08/24/19  224 lb (101.6 kg)     Kidney Function Lab Results  Component Value Date/Time   CREATININE 1.14 04/28/2020 07:23 AM   CREATININE 1.04 09/14/2019 02:40 PM   CREATININE 1.10 05/26/2019 06:25 AM   CREATININE 1.43 (H) 09/15/2018 10:13 AM   GFRNONAA >60 04/28/2020 07:23 AM   GFRNONAA 71 09/14/2019 02:40 PM   GFRAA 82 09/14/2019 02:40 PM    BMP Latest Ref Rng & Units  04/28/2020 09/14/2019 05/26/2019  Glucose 70 - 99 mg/dL 149(H) 98 92  BUN 8 - 23 mg/dL 12 12 20   Creatinine 0.61 - 1.24 mg/dL 1.14 1.04 1.10  BUN/Creat Ratio 6 - 22 (calc) - NOT APPLICABLE -  Sodium 793 - 145 mmol/L 138 139 137  Potassium 3.5 - 5.1 mmol/L 4.2 4.6 4.2  Chloride 98 - 111 mmol/L 104 104 104  CO2 22 - 32 mmol/L 24 27 22   Calcium 8.9 - 10.3 mg/dL 8.6(L) 9.0 8.6(L)    Current antihypertensive regimen:  losartan 100 mg daily.  How often are you checking your Blood Pressure? Patients wife stated several times per month   Current home BP readings: Patients wife his blood pressure range is around 115/70 or 120/75.  What recent interventions/DTPs have been made by any provider to improve Blood Pressure control since last CPP Visit: None.  Any recent hospitalizations or ED visits since last visit with CPP? Yes, 04/28/20 for abdominal pain/gallstones.  What diet changes have been made to improve Blood Pressure Control?  Patients wife stated he eats oatmeal fruits, vegetables, and lots of salads. No fatty foods, or fried foods. His wife stated he eats baked meats and very rarely does he eat bread.  What exercise is being done to improve your Blood Pressure Control?  Patients wife stated he just had gallbladder surgery and cant do much lifting but he usually works out/exercise 4 times a week for about 1.5 hours.  Adherence Review: Is the patient currently on ACE/ARB medication? Yes, Losartan 100 mg.  Does the patient have >5 day gap between last estimated fill dates? No    Follow-Up:  Pharmacist Review   Charlann Lange, RMA Clinical Pharmacist Assistant [phone]  7 minutes spent in review, coordination, and documentation.  Reviewed by: Beverly Milch, PharmD Clinical Pharmacist [facility] [phone]

## 2020-06-27 DIAGNOSIS — M5416 Radiculopathy, lumbar region: Secondary | ICD-10-CM | POA: Diagnosis not present

## 2020-07-04 DIAGNOSIS — C4359 Malignant melanoma of other part of trunk: Secondary | ICD-10-CM | POA: Diagnosis not present

## 2020-07-05 ENCOUNTER — Telehealth: Payer: Self-pay

## 2020-07-05 ENCOUNTER — Other Ambulatory Visit: Payer: Self-pay | Admitting: Family Medicine

## 2020-07-05 MED ORDER — CLOPIDOGREL BISULFATE 75 MG PO TABS: 75.0000 mg | ORAL_TABLET | Freq: Every day | ORAL | 3 refills | Status: DC

## 2020-07-05 NOTE — Telephone Encounter (Signed)
[facility] sent fax that pt was requesting Rx for clopidogrel 75 mg 1 tablet daily. No active rx for this

## 2020-07-19 ENCOUNTER — Telehealth: Payer: Self-pay | Admitting: *Deleted

## 2020-07-19 DIAGNOSIS — M48061 Spinal stenosis, lumbar region without neurogenic claudication: Secondary | ICD-10-CM | POA: Diagnosis not present

## 2020-07-19 DIAGNOSIS — M5416 Radiculopathy, lumbar region: Secondary | ICD-10-CM | POA: Diagnosis not present

## 2020-07-19 NOTE — Telephone Encounter (Signed)
   [facility] Pre-operative Risk Assessment    HEARTCARE STAFF: - Please ensure there is not already an duplicate clearance open for this procedure. - Under Visit Info/Reason for Call, type in Other and utilize the format Clearance MM/DD/YY or Clearance TBD. Do not use dashes or single digits. - If request is for dental extraction, please clarify the # of teeth to be extracted.  Request for surgical clearance:  1. What type of surgery is being performed? LUMBAR SPINE ESI L4-L5   2. When is this surgery scheduled? TBD   3. What type of clearance is required (medical clearance vs. Pharmacy clearance to hold med vs. Both)? MEDICAL  4. Are there any medications that need to be held prior to surgery and how long? PLAVIX x 7 DAYS PRIOR   5. Practice name and name of physician performing surgery? [facility]; DR. Lenord Carbo   6. What is the office phone number? [phone]   7.   What is the office fax number? [phone]  8.   Anesthesia type (None, local, MAC, general) ? Judd Gaudier 07/19/2020, 1:09 PM  _________________________________________________________________   (provider comments below)

## 2020-07-20 NOTE — Telephone Encounter (Signed)
Message has been sent to [place], EP scheduler to set up pre op appt with Dr. Rayann Heman or EP APP.

## 2020-07-20 NOTE — Telephone Encounter (Signed)
   Primary Cardiologist:  Dr. Rayann Heman  Chart reviewed as part of pre-operative protocol coverage.   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 Recommended Plavix clearance from PCP/Neurologist. If surgical team requiring cardiovascular clearance, he will require and appointment however our team has never seen the patient outside of the hospital.   Pre-op covering staff: - Please ask the procedural team if they are wanting pharmacy or cardiac clearance for this patient as this will change which type of appointment he will require.  - Please contact requesting surgeon's office via preferred method (i.e, phone, fax) to inform them of need for appointment prior to surgery.   Kathyrn Drown, NP  07/20/2020, 8:03 AM

## 2020-07-21 NOTE — Telephone Encounter (Signed)
Pt was last seen in our office 07/2019 with Chad Serrano, [place]. Left message for surgery scheduler for Dr. Davy Pique to call our office to discuss procedure for the pt. I called to see if Dr. Davy Pique is only wanting the clearance for Plavix; if so will need to contact PCP; or if needing cardiac clearance as well, pt will then need appt.   See note from Kathyrn Drown, NP: Patient on Plavix forcryptogenic stroke/R central retinal artery occlusion1/2021. Recommended Plavix clearance from PCP/Neurologist.If surgical team requiring cardiovascular clearance, he will require and appointment however our team has never seen the patient outside of the hospital.

## 2020-07-27 NOTE — Telephone Encounter (Signed)
Pt have schedule appt with Tommye Standard 03/11

## 2020-07-27 NOTE — Telephone Encounter (Signed)
   Primary Cardiologist: Thompson Grayer, MD  Chart reviewed as part of pre-operative protocol coverage. Because of Chad Serrano's past medical history and time since last visit, he will require a follow-up visit in order to better assess preoperative cardiovascular risk.  Please reach out to requesting office to make sure they want medical clearance as we have only seen the patient for a loop recorder in the setting of stroke.   Pre-op covering staff: - Please schedule appointment and call patient to inform them. If patient already had an upcoming appointment within acceptable timeframe, please add "pre-op clearance" to the appointment notes so provider is aware. - Please contact requesting surgeon's office via preferred method (i.e, phone, fax) to inform them of need for appointment prior to surgery.  If applicable, this message will also be routed to pharmacy pool and/or primary cardiologist for input on holding anticoagulant/antiplatelet agent as requested below so that this information is available to the clearing provider at time of patient's appointment.   [place], PA  07/27/2020, 8:10 AM

## 2020-07-28 NOTE — Progress Notes (Signed)
Cardiology Office Note Date:  07/28/2020  Patient ID:  Chad Serrano, Chad Serrano May 01, 1946, MRN [id number] PCP:  Donita Brooks, MD  Electrophysiologist: Dr. Johney Frame    Chief Complaint: pre-op, back epidural steroid injection  History of Present Illness: Chad Serrano is a 75 y.o. male with history of HTN, HLD, cryptogenic stroke, RBBB  He was seen in consult with Dr. Johney Frame Jan 2021 via neurology for possible loop implant 2/2 cryptogenic stroke, at that time the patient did not want a loop implant, prefrred to wear an event monitor. He had follow up in the office with A. [place], [place] March 2021, event monitor did not find Afib, the patient did not want to pursue loop implant and planned follow PRN  TODAY He is doing really well. Exercises regularly, stationary bike/warm 4-78miles a day, does weights as well 4d/week, reports excellent exertional capacity No CP, palpitations or cardiac awareness. No dizzy spells, near syncope or syncope. No SOB, DOE  RCRI score is one,  0.9%   Past Medical History:  Diagnosis Date  . Anxiety   . Chronic kidney disease   . Gout   . Hyperlipidemia   . Hypertension   . Hypogonadism male   . Hypogonadism male   . Melanoma (HCC)    at T4 surgically excised at [facility] (2020) free margins and sentinel node biopsy negative  . Melanoma (HCC)   . Stroke [facility])    cryptogenic, right retinal artery occlusion    Past Surgical History:  Procedure Laterality Date  . APPENDECTOMY    . COLONOSCOPY    . ROTATOR CUFF REPAIR Right     Current Outpatient Medications  Medication Sig Dispense Refill  . allopurinol (ZYLOPRIM) 100 MG tablet Take 2 tablets (200 mg total) by mouth daily. 180 tablet 3  . atorvastatin (LIPITOR) 40 MG tablet TAKE 1 TABLET BY MOUTH DAILY AT 6 PM. (Patient taking differently: Take 40 mg by mouth every evening.) 90 tablet 2  . brimonidine (ALPHAGAN) 0.15 % ophthalmic solution Place 1 drop into the right eye 2 (two) times  daily.    . Cholecalciferol (VITAMIN D3) 2000 UNITS TABS Take 2,000 Units by mouth daily.     . clopidogrel (PLAVIX) 75 MG tablet Take 1 tablet (75 mg total) by mouth daily. 90 tablet 3  . HYDROcodone-acetaminophen (NORCO/VICODIN) 5-325 MG tablet Take 1 tablet by mouth every 6 (six) hours as needed for moderate pain. 10 tablet 0  . L-Arginine 500 MG TABS Take 500 mg by mouth daily.     Marland Kitchen losartan (COZAAR) 100 MG tablet Take 1 tablet (100 mg total) by mouth daily. 90 tablet 2  . Magnesium 500 MG CAPS Take 500 mg by mouth daily.     . Multiple Vitamins-Minerals (CENTRUM SILVER ADULT 50+) TABS Take 1 tablet by mouth daily.     . Omega-3 Fatty Acids (FISH OIL) 1000 MG CAPS Take 2,000 mg by mouth daily.     . traMADol (ULTRAM) 50 MG tablet Take 50 mg by mouth every 12 (twelve) hours as needed for moderate pain.    . vitamin B-12 (CYANOCOBALAMIN) 1000 MCG tablet Take 1,000 mcg by mouth daily.     . Zinc 50 MG TABS Take 50 mg by mouth daily.      No current facility-administered medications for this visit.    Allergies:   Patient has no known allergies.   Social History:  The patient  reports that he has never smoked. He has never used smokeless  tobacco. He reports that he does not drink alcohol and does not use drugs.   Family History:  The patient's family history includes Colon polyps in his father.  ROS:  Please see the history of present illness.    All other systems are reviewed and otherwise negative.   PHYSICAL EXAM:  VS:  There were no vitals taken for this visit. BMI: There is no height or weight on file to calculate BMI. Well nourished, well developed, in no acute distress HEENT: normocephalic, atraumatic Neck: no JVD, carotid bruits or masses Cardiac:  RRR; no significant murmurs, no rubs, or gallops Lungs:  CTA b/l, no wheezing, rhonchi or rales Abd: soft, nontender MS: no deformity or atrophy Ext: no edema Skin: warm and dry, no rash Neuro:  No gross deficits  appreciated Psych: euthymic mood, full affect     EKG:  Done today and reviewed by myself shows  SR 82bpm, RBBB (old)  Feb 2021: event monitor Sinus rhythm No atrial fibrillation No sustained arrhythmias   05/26/2019: TTE IMPRESSIONS  1. No definite intracardiac source of emboli. Image quality suboptimal  for the detection of small valvular lesions. Small, highly mobile finding  in right atrium seen well only in subcostal view (clip 79) may represent  venous bubble vs excessive mobility  of normal structure such as Eustatian valve or Chiari network. Cannot  exclude other pathologic lesions based on these images. Consider TEE for  further assessment if clinically indicated. No definite evidence of PFO.  2. Left ventricular ejection fraction, by visual estimation, is 55 to  60%. The left ventricle has normal function. There is no left ventricular  hypertrophy.  3. Left ventricular diastolic parameters are consistent with Grade I  diastolic dysfunction (impaired relaxation).  4. Global right ventricle has normal systolic function.The right  ventricular size is normal. No increase in right ventricular wall  thickness.  5. Left atrial size was normal.  6. Right atrial size was normal.  7. Mild mitral annular calcification.  8. The mitral valve is abnormal. Trivial mitral valve regurgitation. No  evidence of mitral stenosis.  9. The tricuspid valve is normal in structure.  10. The aortic valve was not well visualized. Aortic valve regurgitation  is not visualized. No evidence of aortic valve sclerosis or stenosis.  11. The pulmonic valve was normal in structure. Pulmonic valve  regurgitation is trivial.  12. The inferior vena cava is normal in size with greater than 50%  respiratory variability, suggesting right atrial pressure of 3 mmHg.  13. TR signal is inadequate for assessing pulmonary artery systolic  pressure.   EP consult note at the time of the echo  noted: Of note, Echocardiogram this admission demonstrated LVEF 55-60% along with "Small, highly mobile finding in right atrium seen well only in subcostal view (clip 79) may represent venous bubble vs excessive mobilityof normal structure such as Eustatian valve or Chiari network."   Discussed personally with Dr. Jacques Navy.  Very difficult to visualize due to study, and unlikely to have contributed to stroke. Unclear significance.    Recent Labs: 04/28/2020: ALT 30; BUN 12; Creatinine, Ser 1.14; Hemoglobin 13.8; Platelets 165; Potassium 4.2; Sodium 138  09/14/2019: Cholesterol 111; HDL 39; LDL Cholesterol (Calc) 54; Total CHOL/HDL Ratio 2.8; Triglycerides 94   CrCl cannot be calculated (Patient's most recent lab result is older than the maximum 21 days allowed.).   Wt Readings from Last 3 Encounters:  02/24/20 218 lb (98.9 kg)  09/14/19 218 lb (98.9 kg)  08/24/19 224 lb (101.6 kg)     Other studies reviewed: Additional studies/records reviewed today include: summarized above  ASSESSMENT AND PLAN:  1. Cryptogenic stroke     Neg event monitor for AF     has declined loop  2. HTN     Reports home BP checks daily 120'-130's/70's  3. Pre-procedure     Epidural      RCRI score is 0.9%m low risk     DUKE score is 9.89METS     No cardiac contraindication for epidural     No need for any pre-procedural cardiac testing  Defer plavix recommendations to his PMD/neurologist, pain management MD given on plavix 2/2 stroke, not a cardiac indication   Disposition: F/u with [place] PRN  Current medicines are reviewed at length with the patient today.  The patient did not have any concerns regarding medicines.  Norma Fredrickson, PA-C 07/28/2020 1:27 PM     [facility] [address] [place] [address] [phone] (office)  [phone] (fax)

## 2020-07-29 ENCOUNTER — Encounter: Payer: Self-pay | Admitting: Physician Assistant

## 2020-07-29 ENCOUNTER — Other Ambulatory Visit: Payer: Self-pay

## 2020-07-29 ENCOUNTER — Ambulatory Visit (INDEPENDENT_AMBULATORY_CARE_PROVIDER_SITE_OTHER): Payer: Medicare Other | Admitting: Physician Assistant

## 2020-07-29 VITALS — BP 160/88 | HR 82 | Ht 71.0 in | Wt 214.4 lb

## 2020-07-29 DIAGNOSIS — I10 Essential (primary) hypertension: Secondary | ICD-10-CM

## 2020-07-29 DIAGNOSIS — I4510 Unspecified right bundle-branch block: Secondary | ICD-10-CM | POA: Diagnosis not present

## 2020-07-29 DIAGNOSIS — I639 Cerebral infarction, unspecified: Secondary | ICD-10-CM

## 2020-07-29 DIAGNOSIS — Z01818 Encounter for other preprocedural examination: Secondary | ICD-10-CM

## 2020-07-29 NOTE — Patient Instructions (Signed)
Medication Instructions:  ° ° °Your physician recommends that you continue on your current medications as directed. Please refer to the Current Medication list given to you today. ° ° °*If you need a refill on your cardiac medications before your next appointment, please call your pharmacy* ° ° °Lab Work: NONE ORDERED  TODAY ° ° °If you have labs (blood work) drawn today and your tests are completely normal, you will receive your results only by: °MyChart Message (if you have MyChart) OR °A paper copy in the mail °If you have any lab test that is abnormal or we need to change your treatment, we will call you to review the results. ° ° °Testing/Procedures: NONE ORDERED  TODAY ° ° °Follow-Up: °At [facility], you and your health needs are our priority.  As part of our continuing mission to provide you with exceptional heart care, we have created designated Provider Care Teams.  These Care Teams include your primary Cardiologist (physician) and Advanced Practice Providers (APPs -  Physician Assistants and Nurse Practitioners) who all work together to provide you with the care you need, when you need it. ° °We recommend signing up for the patient portal called "MyChart".  Sign up information is provided on this After Visit Summary.  MyChart is used to connect with patients for Virtual Visits (Telemedicine).  Patients are able to view lab/test results, encounter notes, upcoming appointments, etc.  Non-urgent messages can be sent to your provider as well.   °To learn more about what you can do with MyChart, go to [email or web address].   ° °Your next appointment:   ° °CONTACT [facility] [phone] AS NEEDED FOR  ANY CARDIAC RELATED SYMPTOMS °}  ° ° °Other Instructions ° °

## 2020-08-03 NOTE — Telephone Encounter (Signed)
I s/w Chad Serrano with Dr. Dionne Ano office who states she received some of the notes, but not all for the pre op clearance. I assured Orvil Feil that I will re-fax Tommye Standard, PAC ov note and to see notes in the Assessment & Plan section where [facility] has outline for clearance. I did advise they will have to reach out to PCP Dr. Jenna Luo in regards to clearance for Plavix and pt is on for stroke and not cardiac issue per PAC. I gave phone # [phone] for PCP to [facility].

## 2020-08-09 ENCOUNTER — Ambulatory Visit (INDEPENDENT_AMBULATORY_CARE_PROVIDER_SITE_OTHER): Payer: Medicare Other | Admitting: Nurse Practitioner

## 2020-08-09 ENCOUNTER — Encounter: Payer: Self-pay | Admitting: Nurse Practitioner

## 2020-08-09 ENCOUNTER — Other Ambulatory Visit: Payer: Self-pay

## 2020-08-09 VITALS — BP 134/82 | HR 87 | Temp 98.1°F | Ht 71.0 in | Wt 214.0 lb

## 2020-08-09 DIAGNOSIS — S01402A Unspecified open wound of left cheek and temporomandibular area, initial encounter: Secondary | ICD-10-CM | POA: Diagnosis not present

## 2020-08-09 NOTE — Progress Notes (Signed)
Subjective:    Patient ID: Chad Serrano, male    DOB: [birth year], 75 y.o.   MRN: [id number]  HPI: Chad Serrano is a 75 y.o. male presenting for bleeding.  Chief Complaint  Patient presents with  . cheek injury    Takes blood thinner, injured cheek with fingernail while working in the yard yesterday   Bleeding Location: left cheek Duration: 1 day Pain: not painful  Treatments tried: applying pressure, gauze with tight band aid Associated symptoms: No headaches, vision chanes, no lightheaded or dizziness   No Known Allergies  Outpatient Encounter Medications as of 08/09/2020  Medication Sig  . allopurinol (ZYLOPRIM) 100 MG tablet Take 2 tablets (200 mg total) by mouth daily.  . Ascorbic Acid (VITAMIN C) 1000 MG tablet Take 1,000 mg by mouth daily.  Marland Kitchen atorvastatin (LIPITOR) 40 MG tablet TAKE 1 TABLET BY MOUTH DAILY AT 6 PM.  . betamethasone dipropionate 0.05 % cream Apply topically 2 (two) times daily.  . brimonidine (ALPHAGAN) 0.15 % ophthalmic solution Place 1 drop into the right eye 2 (two) times daily.  . Cholecalciferol (VITAMIN D3) 2000 UNITS TABS Take 2,000 Units by mouth daily.   . clopidogrel (PLAVIX) 75 MG tablet Take 1 tablet (75 mg total) by mouth daily.  Marland Kitchen HYDROcodone-acetaminophen (NORCO/VICODIN) 5-325 MG tablet Take 1 tablet by mouth every 6 (six) hours as needed for moderate pain.  Marland Kitchen L-Arginine 500 MG TABS Take 500 mg by mouth daily.   Marland Kitchen losartan (COZAAR) 100 MG tablet Take 1 tablet (100 mg total) by mouth daily.  . Magnesium 500 MG CAPS Take 500 mg by mouth daily.   . Multiple Vitamins-Minerals (CENTRUM SILVER ADULT 50+) TABS Take 1 tablet by mouth daily.   . Omega-3 Fatty Acids (FISH OIL) 1000 MG CAPS Take 2,000 mg by mouth daily.   . traMADol (ULTRAM) 50 MG tablet Take 50 mg by mouth every 12 (twelve) hours as needed for moderate pain.  . vitamin B-12 (CYANOCOBALAMIN) 1000 MCG tablet Take 1,000 mcg by mouth daily.   . Zinc 50 MG TABS Take 50 mg by mouth  daily.    No facility-administered encounter medications on file as of 08/09/2020.    Patient Active Problem List   Diagnosis Date Noted  . Stroke ([place])   . Loss of vision 05/26/2019  . Cryptogenic stroke ([place]) 05/26/2019  . 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 Melanoma ([place])   . Pulmonary infiltrates 10/02/2018  . Hypogonadism male 08/05/2012  . Other and unspecified hyperlipidemia 08/05/2012  . Hypertension   . Gout     Past Medical History:  Diagnosis Date  . Anxiety   . Chronic kidney disease   . Gout   . Hyperlipidemia   . Hypertension   . Hypogonadism male   . Hypogonadism male   . Melanoma ([place])    at T4 surgically excised at [facility] (2020) free margins and sentinel node biopsy negative  . Melanoma ([place])   . Stroke [facility])    cryptogenic, right retinal artery occlusion    Relevant past medical, surgical, family and social history reviewed and updated as indicated. Interim medical history since our last visit reviewed.  Review of Systems Per HPI unless specifically indicated above     Objective:    BP 134/82   Pulse 87   Temp 98.1 F (36.7 C)   Ht 5\' 11"  (1.803 m)   Wt 214 lb (97.1 kg)   SpO2 98%   BMI 29.85 kg/m   Wt  Readings from Last 3 Encounters:  08/09/20 214 lb (97.1 kg)  07/29/20 214 lb 6.4 oz (97.3 kg)  02/24/20 218 lb (98.9 kg)    Physical Exam Vitals and nursing note reviewed.  Constitutional:      General: He is not in acute distress.    Appearance: Normal appearance. He is not toxic-appearing.  Skin:    General: Skin is warm and dry.     Capillary Refill: Capillary refill takes less than 2 seconds.     Coloration: Skin is not pale.          Comments: 0.5 cm x 0.5 cm wound oozing blood.  Ecchymosis noted lateral to wound.  Neurological:     Mental Status: He is alert and oriented to person, place, and time.  Psychiatric:        Mood and Affect: Mood normal.        Behavior: Behavior normal.        Thought Content: Thought content  normal.        Judgment: Judgment normal.        Assessment & Plan:  1. Wound of left cheek, initial encounter Acute.  Small area on left cheek is oozing small amount of blood, not concerned for acute blood loss or infection today.  Attempted to apply firm pressure in clinic for 2 minutes without successfully stopping the bleeding.  To stop the bleeding, wound was cleansed with normal saline.  Silver nitrate stick was touched to the bleeding area 3 times; hemostasis was achieved.  Triple antibiotic ointment was placed overtop of the wound with a small piece of gauze and tape. Patient was instructed on wound care - not to remove for 24 hours.  Discussed s/s infection and when to return to clinic.     Follow up plan: Return if symptoms worsen or fail to improve.

## 2020-08-24 ENCOUNTER — Encounter: Payer: Self-pay | Admitting: Adult Health

## 2020-08-24 ENCOUNTER — Ambulatory Visit (INDEPENDENT_AMBULATORY_CARE_PROVIDER_SITE_OTHER): Payer: Medicare Other | Admitting: Adult Health

## 2020-08-24 VITALS — BP 132/82 | HR 82 | Ht 71.0 in | Wt 216.0 lb

## 2020-08-24 DIAGNOSIS — H3411 Central retinal artery occlusion, right eye: Secondary | ICD-10-CM

## 2020-08-24 DIAGNOSIS — H5440 Blindness, one eye, unspecified eye: Secondary | ICD-10-CM

## 2020-08-24 NOTE — Progress Notes (Signed)
I agree with the above plan 

## 2020-08-24 NOTE — Patient Instructions (Addendum)
Continue clopidogrel 75 mg daily  and atorvastatin  for secondary stroke prevention  Continue to follow up with PCP regarding cholesterol and blood pressure management  Maintain strict control of hypertension with blood pressure goal below 130/90 and cholesterol with LDL cholesterol (bad cholesterol) goal below 70 mg/dL.       Thank you for coming to see [place] at [facility]. I hope we have been able to provide you high quality care today.  You may receive a patient satisfaction survey over the next few weeks. We would appreciate your feedback and comments so that we may continue to improve ourselves and the health of our patients.

## 2020-08-24 NOTE — Progress Notes (Signed)
[facility] [address] [place]. [place] [address] [phone]       OFFICE FOLLOW-UP NOTE  Chad Serrano Date of Birth:  11-Apr-1946 Medical Record Number:  [id number]    Chief Complaint  Patient presents with  . Follow-up    Rm 14 alone Pt is well and doing excellent, no complaints      HPI:   Today, 08/24/2020, Mr. Leather returns for 36-monthfollow-up unaccompanied   Stable without new stroke/TIA symptoms Residual OD visual loss which has been stable.  Previously followed by ophthalmology but he has since been released.  He continues to maintain ADLs and IADLs independently as well as driving and working without difficulty  Reports compliance on Plavix and atorvastatin -denies associated side effects Blood pressure today initially elevated but on recheck 132/82. Monitors at home and typically 120-130s/70s  S/p gallbladder removal 10/9381without complication  No new concerns at this time     History provided for reference purposes only Update 02/24/2020 JM: Mr. BSpicklerreturns for follow-up regarding CRAO in 05/2019.  Continues to experience right eye visual loss which has been stable without worsening. Minimal vision at periphery but very limited.  Recent visit with ophthalmology with stable exam and plans on revisit in January.  He has remained driving and working in [employer]without difficulty.  Denies new stroke/TIA symptoms.  Remains on Plavix and atorvastatin 40 mg daily for secondary stroke prevention.  Does report bruising on Plavix but denies bleeding.  Blood pressure today 142/84. Monitors at home daily which has been stable - this AM 126/74. No further concerns at this time.   Initial visit 08/24/2019 Dr. SLeonie Man Mr. BAshmeadis a 75year old pleasant Caucasian male seen today for initial office follow-up visit following hospital consultation for vision loss in January 2021.  History is obtained from the patient, review of electronic medical  records and I personally reviewed imaging films in PACS.  Patient developed sudden onset of right eye vision loss on New Year's Eve last year.  He was seen by ophthalmologist who noticed blocked arteries and [facility].  His vision did not improve except he had a little bit of light perception in the right eye temporal field only.  He denied any eye pain injury.  He denied any accompanying stroke symptoms in the form of slurred speech, dizziness, gait ataxia extremity weakness or numbness.  He had no prior history of strokes or TIAs.  He denies any prior history of temporal headaches, scalp tenderness or jaw claudication.  His work-up in the hospital showed ESR to be normal at 2 mm, C-reactive protein was nondetectable.  CT scan of the brain was unremarkable and CT angiogram showed mild carotid bifurcation atherosclerosis without large vessel stenosis or occlusion.  MRI scan showed no acute infarct only changes of age-appropriate small vessel disease.  Transthoracic echo showed normal ejection fraction and no cardiac source of embolism.  There was a small mobile echodensity in the right atrium which was unclear significance.  LDL cholesterol was elevated 135 mg percent and hemoglobin A1c 5.6.  Patient had no history of atrial fibrillation and cardiac monitoring in the hospital was unremarkable.  Loop recorder was suggested but the patient refused.  He subsequently has had 30-day external heart monitor done on 07/07/2019 which showed no evidence of cardiac arrhythmias.  He is still not decided whether he wants to undergo loop recorder.  He is right eye vision loss appears  to still be nearly complete with only mild light perception.  He was seen by eye doctor recently who noticed no significant improvement.  Patient has been able to drive but has to be careful when he turns.  He has had no accidents.  He has no complaints today.  He did tolerate aspirin Plavix  well and currently stop aspirin and is on Plavix alone which is tolerating well without bruising or bleeding.  Is tolerating Lipitor well without muscle aches and pains.  He has an upcoming appointment with his primary care physician to check his follow-up lipid profile.  He has started eating healthy and he does exercise regularly and he knows he needs to lose some weight.     ROS:   14 system review of systems is positive for vision loss in the right eye only and all other systems negative PMH:  Past Medical History:  Diagnosis Date  . Anxiety   . Chronic kidney disease   . Gout   . Hyperlipidemia   . Hypertension   . Hypogonadism male   . Hypogonadism male   . Melanoma ([place])    at T4 surgically excised at [facility] (2020) free margins and sentinel node biopsy negative  . Melanoma ([place])   . Stroke [facility])    cryptogenic, right retinal artery occlusion    Social History:  Social History   Socioeconomic History  . Marital status: Married    Spouse name: Not on file  . Number of children: Not on file  . Years of education: Not on file  . Highest education level: Not on file  Occupational History  . Not on file  Tobacco Use  . Smoking status: Never Smoker  . Smokeless tobacco: Never Used  Substance and Sexual Activity  . Alcohol use: No  . Drug use: No  . Sexual activity: Not on file    Comment: married to Blandon.  Retired.  Weightlifter  Other Topics Concern  . Not on file  Social History Narrative  . Not on file   Social Determinants of Health   Financial Resource Strain: Low Risk   . Difficulty of Paying Living Expenses: Not hard at all  Food Insecurity: Not on file  Transportation Needs: Not on file  Physical Activity: Not on file  Stress: Not on file  Social Connections: Not on file  Intimate Partner Violence: Not on file    Medications:   Current Outpatient Medications on File Prior to Visit  Medication Sig Dispense Refill  . allopurinol (ZYLOPRIM) 100 MG  tablet Take 2 tablets (200 mg total) by mouth daily. 180 tablet 3  . Ascorbic Acid (VITAMIN C) 1000 MG tablet Take 1,000 mg by mouth daily.    Marland Kitchen atorvastatin (LIPITOR) 40 MG tablet TAKE 1 TABLET BY MOUTH DAILY AT 6 PM. 90 tablet 2  . betamethasone dipropionate 0.05 % cream Apply topically 2 (two) times daily.    . brimonidine (ALPHAGAN) 0.15 % ophthalmic solution Place 1 drop into the right eye 2 (two) times daily.    . Cholecalciferol (VITAMIN D3) 2000 UNITS TABS Take 2,000 Units by mouth daily.     . clopidogrel (PLAVIX) 75 MG tablet Take 1 tablet (75 mg total) by mouth daily. 90 tablet 3  . HYDROcodone-acetaminophen (NORCO/VICODIN) 5-325 MG tablet Take 1 tablet by mouth every 6 (six) hours as needed for moderate pain. 10 tablet 0  . L-Arginine 500 MG TABS Take 500 mg by mouth daily.     Marland Kitchen losartan (  COZAAR) 100 MG tablet Take 1 tablet (100 mg total) by mouth daily. 90 tablet 2  . Magnesium 500 MG CAPS Take 500 mg by mouth daily.     . Multiple Vitamins-Minerals (CENTRUM SILVER ADULT 50+) TABS Take 1 tablet by mouth daily.     . Omega-3 Fatty Acids (FISH OIL) 1000 MG CAPS Take 2,000 mg by mouth daily.     . traMADol (ULTRAM) 50 MG tablet Take 50 mg by mouth every 12 (twelve) hours as needed for moderate pain.    . vitamin B-12 (CYANOCOBALAMIN) 1000 MCG tablet Take 1,000 mcg by mouth daily.     . Zinc 50 MG TABS Take 50 mg by mouth daily.      No current facility-administered medications on file prior to visit.    Allergies:  No Known Allergies  Physical Exam Today's Vitals   08/24/20 1234  BP: 132/82  Pulse: 82  Weight: 216 lb (98 kg)  Height: '5\' 11"'  (1.803 m)   Body mass index is 30.13 kg/m.  General: Mildly obese very pleasant elderly Caucasian male seated, in no evident distress Head: head normocephalic and atraumatic.  Neck: supple with no carotid or supraclavicular bruits Cardiovascular: regular rate and rhythm, no murmurs Musculoskeletal: no deformity Skin:  no  rash/petichiae Vascular:  Normal pulses all extremities  Neurologic Exam Mental Status: Awake and fully alert.  Fluent speech and language.  Oriented to place and time. Recent and remote memory intact. Attention span, concentration and fund of knowledge appropriate. Mood and affect appropriate.  Cranial Nerves: Vision acuity significantly impaired in the right eye with only light perception in crescentic portion of the temporal field in the right eye.  Extraocular movements full without nystagmus.  Left eye visual fields full to confrontation. Hearing intact. Facial sensation intact. Face, tongue, palate moves normally and symmetrically.  Motor: Normal bulk and tone. Normal strength in all tested extremity muscles. Sensory.: intact to touch ,pinprick .position and vibratory sensation.  Coordination: Rapid alternating movements normal in all extremities. Finger-to-nose and heel-to-shin performed accurately bilaterally. Gait and Station: Arises from chair without difficulty. Stance is normal. Gait demonstrates normal stride length and balance . Able to heel, toe and tandem walk without difficulty.  Reflexes: 1+ and symmetric. Toes downgoing.      ASSESSMENT/PLAN:   75 year old Caucasian male with sudden onset of painless right eye vision loss in December 2020 secondary to central retinal artery occlusion.  Vascular risk factors of hypertension, hyperlipidemia, age, mild obesity and carotid atherosclerosis   OD CRAO -Residual OD visual loss with minimal peripheral vision - stable -Continue Plavix 75 mg daily and atorvastatin 40 mg daily for secondary stroke prevention -Discussed secondary stroke prevention measures and importance of close PCP follow-up to maintain strict control of hypertension with blood pressure goal below 130/90 and lipids with LDL cholesterol goal below 70 mg/dL. Plans on scheduling f/u visit with PCP for repeat lab work    Overall stable from stroke standpoint and  recommend follow-up on an as-needed basis which patient was in agreement to   CC:  [place] provider: Dr. Elissa Hefty, Cammie Mcgee, MD    I spent 25 minutes of face-to-face and non-face-to-face time with patient.  This included previsit chart review, lab review, study review, order entry, electronic health record documentation, patient education and discussion regarding CRAO with residual visual loss, importance of managing stroke risk factors and answered all other questions to patients satisfaction  Frann Rider, AGNP-BC  [facility] [place]  [place], [place] [address]  Phone [phone] Fax [phone] Note: This document was prepared with digital dictation and possible smart phrase technology. Any transcriptional errors that result from this process are unintentional.

## 2020-08-26 ENCOUNTER — Telehealth: Payer: Self-pay | Admitting: Pharmacist

## 2020-08-26 NOTE — Progress Notes (Addendum)
Chronic Care Management Pharmacy Assistant   Name: Trini Christiansen  MRN: [id number] DOB: [birth year]  Reason for Encounter: Disease State For HTN.   Conditions to be addressed/monitored: Hypertension, spinal stenosis, hyperlipidemia.  Recent office visits:  08/09/20 Eulogio Bear, NP. For wound of left cheek. Per note: Wound was cleaned and discuused wound care for at home. No medication changes.   Recent consult visits:  08/24/20 Neurology Frann Rider, NP. For right eye follow-up. No medication changes.  07/29/20 [facility] [place], PA-C. For a Pre-op/Follow-Up. No medication changes.  07/04/20 [facility] Long, Ann Held, FNP. For malignant melanoma of torso excluding breast. No medication changes.   Hospital visits:  None since 06/21/20  Medications: Outpatient Encounter Medications as of 08/26/2020  Medication Sig   allopurinol (ZYLOPRIM) 100 MG tablet Take 2 tablets (200 mg total) by mouth daily.   Ascorbic Acid (VITAMIN C) 1000 MG tablet Take 1,000 mg by mouth daily.   atorvastatin (LIPITOR) 40 MG tablet TAKE 1 TABLET BY MOUTH DAILY AT 6 PM.   betamethasone dipropionate 0.05 % cream Apply topically 2 (two) times daily.   brimonidine (ALPHAGAN) 0.15 % ophthalmic solution Place 1 drop into the right eye 2 (two) times daily.   Cholecalciferol (VITAMIN D3) 2000 UNITS TABS Take 2,000 Units by mouth daily.    clopidogrel (PLAVIX) 75 MG tablet Take 1 tablet (75 mg total) by mouth daily.   HYDROcodone-acetaminophen (NORCO/VICODIN) 5-325 MG tablet Take 1 tablet by mouth every 6 (six) hours as needed for moderate pain.   L-Arginine 500 MG TABS Take 500 mg by mouth daily.    losartan (COZAAR) 100 MG tablet Take 1 tablet (100 mg total) by mouth daily.   Magnesium 500 MG CAPS Take 500 mg by mouth daily.    Multiple Vitamins-Minerals (CENTRUM SILVER ADULT 50+) TABS Take 1 tablet by mouth daily.    Omega-3 Fatty Acids (FISH OIL) 1000 MG CAPS Take  2,000 mg by mouth daily.    traMADol (ULTRAM) 50 MG tablet Take 50 mg by mouth every 12 (twelve) hours as needed for moderate pain.   vitamin B-12 (CYANOCOBALAMIN) 1000 MCG tablet Take 1,000 mcg by mouth daily.    Zinc 50 MG TABS Take 50 mg by mouth daily.    No facility-administered encounter medications on file as of 08/26/2020.    Reviewed chart prior to disease state call. Spoke with patient regarding BP  Recent Office Vitals: BP Readings from Last 3 Encounters:  08/24/20 132/82  08/09/20 134/82  07/29/20 (!) 160/88   Pulse Readings from Last 3 Encounters:  08/24/20 82  08/09/20 87  07/29/20 82    Wt Readings from Last 3 Encounters:  08/24/20 216 lb (98 kg)  08/09/20 214 lb (97.1 kg)  07/29/20 214 lb 6.4 oz (97.3 kg)     Kidney Function Lab Results  Component Value Date/Time   CREATININE 1.14 04/28/2020 07:23 AM   CREATININE 1.04 09/14/2019 02:40 PM   CREATININE 1.10 05/26/2019 06:25 AM   CREATININE 1.43 (H) 09/15/2018 10:13 AM   GFRNONAA >60 04/28/2020 07:23 AM   GFRNONAA 71 09/14/2019 02:40 PM   GFRAA 82 09/14/2019 02:40 PM    BMP Latest Ref Rng & Units 04/28/2020 09/14/2019 05/26/2019  Glucose 70 - 99 mg/dL 149(H) 98 92  BUN 8 - 23 mg/dL 12 12 20   Creatinine 0.61 - 1.24 mg/dL 1.14 1.04 1.10  BUN/Creat Ratio 6 - 22 (calc) - NOT APPLICABLE -  Sodium 400 -  145 mmol/L 138 139 137  Potassium 3.5 - 5.1 mmol/L 4.2 4.6 4.2  Chloride 98 - 111 mmol/L 104 104 104  CO2 22 - 32 mmol/L 24 27 22   Calcium 8.9 - 10.3 mg/dL 8.6(L) 9.0 8.6(L)    Current antihypertensive regimen:  losartan 100 mg daily.  How often are you checking your Blood Pressure? Patients wife stated 3-5x per week   Current home BP readings: Patients wife stated:  Sunday 08/28/20 121/75 Monday 08/29/20 122/76 Tuesday 08/30/20 125/70  What recent interventions/DTPs have been made by any provider to improve Blood Pressure control since last CPP Visit: None  Any recent hospitalizations or ED visits since  last visit with CPP? Patients wife stated no.  What diet changes have been made to improve Blood Pressure Control?  Patients wife stated she makes him a lot of baked chicken and salmon. She stated he eats fruit, tuna, and vegetables. Patients wife stated he drinks a lot of water and drinks decaf tea and coffee.  What exercise is being done to improve your Blood Pressure Control?  Patients wife stated he works out every other day for about 30 min. She stated since the weather has gotten better he has been working outside in the yard.   Adherence Review: Is the patient currently on ACE/ARB medication? losartan 100 mg daily.  Does the patient have >5 day gap between last estimated fill dates? Per misc rpts, no.  Star Rating Drugs: losartan 100 mg daily 90 DS 06/14/20 , Atorvastatin 40 mg daily 90 DS 06/28/20   Charlann Lange, RMA Clinical Pharmacist Assistant [phone]  9 minutes spent in review, coordination, and documentation.  Reviewed by: Beverly Milch, PharmD Clinical Pharmacist [facility] [phone]

## 2020-09-13 DIAGNOSIS — L821 Other seborrheic keratosis: Secondary | ICD-10-CM | POA: Diagnosis not present

## 2020-09-13 DIAGNOSIS — L820 Inflamed seborrheic keratosis: Secondary | ICD-10-CM | POA: Diagnosis not present

## 2020-09-13 DIAGNOSIS — Z85820 Personal history of malignant melanoma of skin: Secondary | ICD-10-CM | POA: Diagnosis not present

## 2020-09-13 DIAGNOSIS — L400 Psoriasis vulgaris: Secondary | ICD-10-CM | POA: Diagnosis not present

## 2020-09-13 DIAGNOSIS — L812 Freckles: Secondary | ICD-10-CM | POA: Diagnosis not present

## 2020-09-13 DIAGNOSIS — L570 Actinic keratosis: Secondary | ICD-10-CM | POA: Diagnosis not present

## 2020-09-13 DIAGNOSIS — L720 Epidermal cyst: Secondary | ICD-10-CM | POA: Diagnosis not present

## 2020-09-14 ENCOUNTER — Other Ambulatory Visit: Payer: Medicare Other

## 2020-09-14 ENCOUNTER — Other Ambulatory Visit: Payer: Self-pay

## 2020-09-14 DIAGNOSIS — E7800 Pure hypercholesterolemia, unspecified: Secondary | ICD-10-CM | POA: Diagnosis not present

## 2020-09-14 DIAGNOSIS — Z13220 Encounter for screening for lipoid disorders: Secondary | ICD-10-CM

## 2020-09-14 DIAGNOSIS — Z0000 Encounter for general adult medical examination without abnormal findings: Secondary | ICD-10-CM

## 2020-09-14 DIAGNOSIS — Z136 Encounter for screening for cardiovascular disorders: Secondary | ICD-10-CM | POA: Diagnosis not present

## 2020-09-14 DIAGNOSIS — Z1159 Encounter for screening for other viral diseases: Secondary | ICD-10-CM

## 2020-09-14 DIAGNOSIS — Z125 Encounter for screening for malignant neoplasm of prostate: Secondary | ICD-10-CM | POA: Diagnosis not present

## 2020-09-14 DIAGNOSIS — I10 Essential (primary) hypertension: Secondary | ICD-10-CM | POA: Diagnosis not present

## 2020-09-14 DIAGNOSIS — Z8673 Personal history of transient ischemic attack (TIA), and cerebral infarction without residual deficits: Secondary | ICD-10-CM

## 2020-09-15 LAB — CBC WITH DIFFERENTIAL/PLATELET
Absolute Monocytes: 603 cells/uL (ref 200–950)
Basophils Absolute: 70 cells/uL (ref 0–200)
Basophils Relative: 1.2 %
Eosinophils Absolute: 232 cells/uL (ref 15–500)
Eosinophils Relative: 4 %
HCT: 42.9 % (ref 38.5–50.0)
Hemoglobin: 14.2 g/dL (ref 13.2–17.1)
Lymphs Abs: 2587 cells/uL (ref 850–3900)
MCH: 32.1 pg (ref 27.0–33.0)
MCHC: 33.1 g/dL (ref 32.0–36.0)
MCV: 97.1 fL (ref 80.0–100.0)
MPV: 11.1 fL (ref 7.5–12.5)
Monocytes Relative: 10.4 %
Neutro Abs: 2308 cells/uL (ref 1500–7800)
Neutrophils Relative %: 39.8 %
Platelets: 159 10*3/uL (ref 140–400)
RBC: 4.42 10*6/uL (ref 4.20–5.80)
RDW: 12.6 % (ref 11.0–15.0)
Total Lymphocyte: 44.6 %
WBC: 5.8 10*3/uL (ref 3.8–10.8)

## 2020-09-15 LAB — COMPLETE METABOLIC PANEL WITH GFR
AG Ratio: 2 (calc) (ref 1.0–2.5)
ALT: 29 U/L (ref 9–46)
AST: 32 U/L (ref 10–35)
Albumin: 3.9 g/dL (ref 3.6–5.1)
Alkaline phosphatase (APISO): 86 U/L (ref 35–144)
BUN: 13 mg/dL (ref 7–25)
CO2: 27 mmol/L (ref 20–32)
Calcium: 8.6 mg/dL (ref 8.6–10.3)
Chloride: 105 mmol/L (ref 98–110)
Creat: 1.1 mg/dL (ref 0.70–1.18)
GFR, Est African American: 76 mL/min/{1.73_m2} (ref >=60)
GFR, Est Non African American: 66 mL/min/{1.73_m2} (ref >=60)
Globulin: 2 g/dL (calc) (ref 1.9–3.7)
Glucose, Bld: 92 mg/dL (ref 65–99)
Potassium: 4.5 mmol/L (ref 3.5–5.3)
Sodium: 141 mmol/L (ref 135–146)
Total Bilirubin: 0.7 mg/dL (ref 0.2–1.2)
Total Protein: 5.9 g/dL — ABNORMAL LOW (ref 6.1–8.1)

## 2020-09-15 LAB — HEPATITIS C ANTIBODY
Hepatitis C Ab: NONREACTIVE
SIGNAL TO CUT-OFF: 0.01 (ref <1.00)

## 2020-09-15 LAB — LIPID PANEL
Cholesterol: 124 mg/dL (ref <200)
HDL: 47 mg/dL (ref >=40)
LDL Cholesterol (Calc): 62 mg/dL (calc)
Non-HDL Cholesterol (Calc): 77 mg/dL (calc) (ref <130)
Total CHOL/HDL Ratio: 2.6 (calc) (ref <5.0)
Triglycerides: 69 mg/dL (ref <150)

## 2020-09-15 LAB — PSA: PSA: 0.52 ng/mL (ref <=4.00)

## 2020-09-20 ENCOUNTER — Encounter: Payer: Medicare Other | Admitting: Family Medicine

## 2020-09-20 ENCOUNTER — Ambulatory Visit (INDEPENDENT_AMBULATORY_CARE_PROVIDER_SITE_OTHER): Payer: Medicare Other | Admitting: Family Medicine

## 2020-09-20 ENCOUNTER — Encounter: Payer: Self-pay | Admitting: Family Medicine

## 2020-09-20 ENCOUNTER — Other Ambulatory Visit: Payer: Self-pay

## 2020-09-20 VITALS — BP 124/62 | HR 84 | Temp 98.0°F | Resp 16 | Ht 71.0 in | Wt 217.0 lb

## 2020-09-20 DIAGNOSIS — H547 Unspecified visual loss: Secondary | ICD-10-CM | POA: Diagnosis not present

## 2020-09-20 DIAGNOSIS — I10 Essential (primary) hypertension: Secondary | ICD-10-CM | POA: Diagnosis not present

## 2020-09-20 DIAGNOSIS — Z0001 Encounter for general adult medical examination with abnormal findings: Secondary | ICD-10-CM

## 2020-09-20 DIAGNOSIS — Z8673 Personal history of transient ischemic attack (TIA), and cerebral infarction without residual deficits: Secondary | ICD-10-CM | POA: Diagnosis not present

## 2020-09-20 DIAGNOSIS — I6930 Unspecified sequelae of cerebral infarction: Secondary | ICD-10-CM

## 2020-09-20 DIAGNOSIS — I639 Cerebral infarction, unspecified: Secondary | ICD-10-CM

## 2020-09-20 DIAGNOSIS — E7800 Pure hypercholesterolemia, unspecified: Secondary | ICD-10-CM

## 2020-09-20 DIAGNOSIS — Z0000 Encounter for general adult medical examination without abnormal findings: Secondary | ICD-10-CM

## 2020-09-20 NOTE — Progress Notes (Signed)
Subjective:    Patient ID: Chad Serrano, male    DOB: 22-Jul-1945, 75 y.o.   MRN: [id number]  HPI  Patient is a very pleasant 75 year old Caucasian male here today for complete physical exam.  He is due for a booster on his COVID shot.  He is hesitant to receive that however he politely listened to my opinion regarding boosters on COVID-vaccine.  He is also due for Shingrix which I recommended.  Last colonoscopy was in 2019 and is up-to-date.  PSA was recently checked and his lab work was excellent.  He denies any falls or depression or memory loss.  Please see his lab work below Immunization History  Administered Date(s) Administered  . Fluad Quad(high Dose 65+) 02/17/2019  . Hepatitis A 11/29/2003, 08/08/2009  . Hepatitis B 08/08/2009, 09/13/2009  . IPV 05/29/1965  . Influenza Split 04/28/2012, 04/13/2013  . Influenza Whole 02/16/2011  . Influenza, High Dose Seasonal PF 02/25/2017, 03/20/2018  . Influenza,inj,Quad PF,6+ Mos 04/30/2014, 05/05/2015, 03/13/2016  . PFIZER(Purple Top)SARS-COV-2 Vaccination 07/17/2019, 08/11/2019  . Plague 03/28/1966  . Pneumococcal Conjugate-13 05/11/2013  . Pneumococcal Polysaccharide-23 04/30/2014  . Td 04/28/2012  . Typhoid Parenteral 01/27/1965, 08/08/2009   Lab on 09/14/2020  Component Date Value Ref Range Status  . WBC 09/14/2020 5.8  3.8 - 10.8 Thousand/uL Final  . RBC 09/14/2020 4.42  4.20 - 5.80 Million/uL Final  . Hemoglobin 09/14/2020 14.2  13.2 - 17.1 g/dL Final  . HCT 09/14/2020 42.9  38.5 - 50.0 % Final  . MCV 09/14/2020 97.1  80.0 - 100.0 fL Final  . MCH 09/14/2020 32.1  27.0 - 33.0 pg Final  . MCHC 09/14/2020 33.1  32.0 - 36.0 g/dL Final  . RDW 09/14/2020 12.6  11.0 - 15.0 % Final  . Platelets 09/14/2020 159  140 - 400 Thousand/uL Final  . MPV 09/14/2020 11.1  7.5 - 12.5 fL Final  . Neutro Abs 09/14/2020 2,308  1,500 - 7,800 cells/uL Final  . Lymphs Abs 09/14/2020 2,587  850 - 3,900 cells/uL Final  . Absolute Monocytes 09/14/2020  603  200 - 950 cells/uL Final  . Eosinophils Absolute 09/14/2020 232  15 - 500 cells/uL Final  . Basophils Absolute 09/14/2020 70  0 - 200 cells/uL Final  . Neutrophils Relative % 09/14/2020 39.8  % Final  . Total Lymphocyte 09/14/2020 44.6  % Final  . Monocytes Relative 09/14/2020 10.4  % Final  . Eosinophils Relative 09/14/2020 4.0  % Final  . Basophils Relative 09/14/2020 1.2  % Final  . Glucose, Bld 09/14/2020 92  65 - 99 mg/dL Final   Comment: .            Fasting reference interval .   . BUN 09/14/2020 13  7 - 25 mg/dL Final  . Creat 09/14/2020 1.10  0.70 - 1.18 mg/dL Final   Comment: For patients >65 years of age, the reference limit for Creatinine is approximately 13% higher for people identified as African-American. .   . GFR, Est Non African American 09/14/2020 66  > OR = 60 mL/min/1.30m2 Final  . GFR, Est African American 09/14/2020 76  > OR = 60 mL/min/1.49m2 Final  . BUN/Creatinine Ratio 33/82/5053 NOT APPLICABLE  6 - 22 (calc) Final  . Sodium 09/14/2020 141  135 - 146 mmol/L Final  . Potassium 09/14/2020 4.5  3.5 - 5.3 mmol/L Final  . Chloride 09/14/2020 105  98 - 110 mmol/L Final  . CO2 09/14/2020 27  20 - 32 mmol/L Final  .  Calcium 09/14/2020 8.6  8.6 - 10.3 mg/dL Final  . Total Protein 09/14/2020 5.9* 6.1 - 8.1 g/dL Final  . Albumin 09/14/2020 3.9  3.6 - 5.1 g/dL Final  . Globulin 09/14/2020 2.0  1.9 - 3.7 g/dL (calc) Final  . AG Ratio 09/14/2020 2.0  1.0 - 2.5 (calc) Final  . Total Bilirubin 09/14/2020 0.7  0.2 - 1.2 mg/dL Final  . Alkaline phosphatase (APISO) 09/14/2020 86  35 - 144 U/L Final  . AST 09/14/2020 32  10 - 35 U/L Final  . ALT 09/14/2020 29  9 - 46 U/L Final  . Hepatitis C Ab 09/14/2020 NON-REACTIVE  NON-REACTIVE Final  . SIGNAL TO CUT-OFF 09/14/2020 0.01  <1.00 Final   Comment: . HCV antibody was non-reactive. There is no laboratory  evidence of HCV infection. . In most cases, no further action is required. However, if recent HCV exposure is  suspected, a test for HCV RNA (test code 859 [id number]) is suggested. . For additional information please refer to http://education.[facility].com/faq/FAQ22v1 (This link is being provided for informational/ educational purposes only.) .   Marland Kitchen Cholesterol 09/14/2020 124  <200 mg/dL Final  . HDL 09/14/2020 47  > OR = 40 mg/dL Final  . Triglycerides 09/14/2020 69  <150 mg/dL Final  . LDL Cholesterol (Calc) 09/14/2020 62  mg/dL (calc) Final   Comment: Reference range: <100 . Desirable range <100 mg/dL for primary prevention;   <70 mg/dL for patients with CHD or diabetic patients  with > or = 2 CHD risk factors. Marland Kitchen LDL-C is now calculated using the Martin-Hopkins  calculation, which is a validated novel method providing  better accuracy than the Friedewald equation in the  estimation of LDL-C.  Cresenciano Genre et al. Annamaria Helling. 1751;025(85): 2061-2068  (http://education.[facility].com/faq/FAQ164)   . Total CHOL/HDL Ratio 09/14/2020 2.6  <5.0 (calc) Final  . Non-HDL Cholesterol (Calc) 09/14/2020 77  <130 mg/dL (calc) Final   Comment: For patients with diabetes plus 1 major ASCVD risk  factor, treating to a non-HDL-C goal of <100 mg/dL  (LDL-C of <70 mg/dL) is considered a therapeutic  option.   Marland Kitchen PSA 09/14/2020 0.52  < OR = 4.00 ng/mL Final   Comment: The total PSA value from this assay system is  standardized against the WHO standard. The test  result will be approximately 20% lower when compared  to the equimolar-standardized total PSA (Beckman  Coulter). Comparison of serial PSA results should be  interpreted with this fact in mind. . This test was performed using the Siemens  chemiluminescent method. Values obtained from  different assay methods cannot be used interchangeably. PSA levels, regardless of value, should not be interpreted as absolute evidence of the presence or absence of disease.      Past Medical History:  Diagnosis Date  . Anxiety   . Chronic kidney disease    . Gout   . Hyperlipidemia   . Hypertension   . Hypogonadism male   . Hypogonadism male   . Melanoma ([place])    at T4 surgically excised at [facility] (2020) free margins and sentinel node biopsy negative  . Melanoma ([place])   . Stroke [facility])    cryptogenic, right retinal artery occlusion   Past Surgical History:  Procedure Laterality Date  . APPENDECTOMY    . COLONOSCOPY    . GALLBLADDER SURGERY  05/2020  . ROTATOR CUFF REPAIR Right    Current Outpatient Medications on File Prior to Visit  Medication Sig Dispense Refill  . allopurinol (ZYLOPRIM) 100 MG tablet  Take 2 tablets (200 mg total) by mouth daily. 180 tablet 3  . Ascorbic Acid (VITAMIN C) 1000 MG tablet Take 1,000 mg by mouth daily.    Marland Kitchen atorvastatin (LIPITOR) 40 MG tablet TAKE 1 TABLET BY MOUTH DAILY AT 6 PM. 90 tablet 2  . betamethasone dipropionate 0.05 % cream Apply topically 2 (two) times daily.    . Cholecalciferol (VITAMIN D3) 2000 UNITS TABS Take 2,000 Units by mouth daily.     . clopidogrel (PLAVIX) 75 MG tablet Take 1 tablet (75 mg total) by mouth daily. 90 tablet 3  . L-Arginine 500 MG TABS Take 500 mg by mouth daily.     Marland Kitchen losartan (COZAAR) 100 MG tablet Take 1 tablet (100 mg total) by mouth daily. 90 tablet 2  . Magnesium 500 MG CAPS Take 500 mg by mouth daily.     . Multiple Vitamins-Minerals (CENTRUM SILVER ADULT 50+) TABS Take 1 tablet by mouth daily.     . Omega-3 Fatty Acids (FISH OIL) 1000 MG CAPS Take 2,000 mg by mouth daily.     . traMADol (ULTRAM) 50 MG tablet Take 50 mg by mouth every 12 (twelve) hours as needed for moderate pain.    . vitamin B-12 (CYANOCOBALAMIN) 1000 MCG tablet Take 1,000 mcg by mouth daily.     . Zinc 50 MG TABS Take 50 mg by mouth daily.      No current facility-administered medications on file prior to visit.   No Known Allergies Social History   Socioeconomic History  . Marital status: Married    Spouse name: Not on file  . Number of children: Not on file  . Years of education:  Not on file  . Highest education level: Not on file  Occupational History  . Not on file  Tobacco Use  . Smoking status: Never Smoker  . Smokeless tobacco: Never Used  Substance and Sexual Activity  . Alcohol use: No  . Drug use: No  . 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 Not on file  Social History Narrative  . Not on file   Social Determinants of Health   Financial Resource Strain: Not on file  Food Insecurity: Not on file  Transportation Needs: Not on file  Physical Activity: Not on file  Stress: Not on file  Social Connections: Not on file  Intimate Partner Violence: Not on file   Family History  Problem Relation Age of Onset  . Colon polyps Father   . Colon cancer Neg Hx   . Esophageal cancer Neg Hx   . Rectal cancer Neg Hx   . Stomach cancer Neg Hx       Review of Systems  All other systems reviewed and are negative.      Objective:   Physical Exam Vitals reviewed.  Constitutional:      General: He is not in acute distress.    Appearance: Normal appearance. He is not ill-appearing, toxic-appearing or diaphoretic.  HENT:     Head: Normocephalic and atraumatic.     Right Ear: Tympanic membrane and ear canal normal. There is no impacted cerumen.     Left Ear: Tympanic membrane and ear canal normal. There is no impacted cerumen.     Nose: No congestion or rhinorrhea.     Mouth/Throat:     Mouth: Mucous membranes are moist.     Pharynx: Oropharynx is clear. No oropharyngeal  exudate or posterior oropharyngeal erythema.  Eyes:     General: No scleral icterus.       Right eye: No discharge.        Left eye: No discharge.     Extraocular Movements: Extraocular movements intact.     Conjunctiva/sclera: Conjunctivae normal.     Pupils: Pupils are equal, round, and reactive to light.  Neck:     Vascular: No carotid bruit.  Cardiovascular:     Rate and Rhythm: Normal rate and regular  rhythm.     Heart sounds: No murmur heard. No friction rub. No gallop.   Pulmonary:     Effort: Pulmonary effort is normal. No respiratory distress.     Breath sounds: Normal breath sounds. No stridor. No wheezing, rhonchi or rales.  Chest:     Chest wall: No tenderness.  Abdominal:     General: Abdomen is flat. Bowel sounds are normal. There is no distension.     Palpations: Abdomen is soft.     Tenderness: There is no abdominal tenderness. There is no right CVA tenderness, left CVA tenderness, guarding or rebound.     Hernia: No hernia is present.  Musculoskeletal:     Cervical back: Normal range of motion and neck supple. No rigidity or tenderness.     Right lower leg: No edema.     Left lower leg: No edema.  Lymphadenopathy:     Cervical: No cervical adenopathy.  Skin:    General: Skin is warm.     Coloration: Skin is not jaundiced or pale.     Findings: No bruising, erythema, lesion or rash.  Neurological:     General: No focal deficit present.     Mental Status: He is alert and oriented to person, place, and time. Mental status is at baseline.     Cranial Nerves: No cranial nerve deficit.     Sensory: No sensory deficit.     Motor: No weakness.     Coordination: Coordination normal.     Gait: Gait normal.     Deep Tendon Reflexes: Reflexes normal.  Psychiatric:        Mood and Affect: Mood normal.        Behavior: Behavior normal.        Thought Content: Thought content normal.        Judgment: Judgment normal.       Assessment & Plan:  Cryptogenic stroke (HCC)  Pure hypercholesterolemia  Essential hypertension  History of CVA (cerebrovascular accident)  Loss of vision  General medical exam  Patient's physical exam is outstanding.  His blood pressure is excellent.  His lab work is exceptional.  Colonoscopy is up-to-date in 2019 and PSA is excellent.  I did recommend a booster on his COVID shot which the patient declines.  Also recommended the shingles  vaccine.  The remainder of his preventative care is up-to-date.  Patient is in very good shape and shows no evidence or signs of memory loss or depression.  He has not falling.

## 2020-09-26 DIAGNOSIS — M5416 Radiculopathy, lumbar region: Secondary | ICD-10-CM | POA: Diagnosis not present

## 2020-10-27 DIAGNOSIS — M48061 Spinal stenosis, lumbar region without neurogenic claudication: Secondary | ICD-10-CM | POA: Diagnosis not present

## 2020-10-27 DIAGNOSIS — M4316 Spondylolisthesis, lumbar region: Secondary | ICD-10-CM | POA: Diagnosis not present

## 2020-11-03 DIAGNOSIS — L720 Epidermal cyst: Secondary | ICD-10-CM | POA: Diagnosis not present

## 2020-11-03 DIAGNOSIS — Z85820 Personal history of malignant melanoma of skin: Secondary | ICD-10-CM | POA: Diagnosis not present

## 2020-11-20 ENCOUNTER — Other Ambulatory Visit: Payer: Self-pay | Admitting: Family Medicine

## 2020-12-29 ENCOUNTER — Other Ambulatory Visit: Payer: Self-pay | Admitting: Family Medicine

## 2020-12-29 DIAGNOSIS — M5416 Radiculopathy, lumbar region: Secondary | ICD-10-CM | POA: Diagnosis not present

## 2021-01-09 ENCOUNTER — Other Ambulatory Visit: Payer: Self-pay | Admitting: Family Medicine

## 2021-01-17 ENCOUNTER — Encounter: Payer: Self-pay | Admitting: Family Medicine

## 2021-01-17 ENCOUNTER — Other Ambulatory Visit: Payer: Self-pay | Admitting: Family Medicine

## 2021-01-17 ENCOUNTER — Other Ambulatory Visit: Payer: Self-pay

## 2021-01-17 ENCOUNTER — Ambulatory Visit
Admission: RE | Admit: 2021-01-17 | Discharge: 2021-01-17 | Disposition: A | Discharge status: Home / Self Care | Payer: Medicare Other | Source: Ambulatory Visit | Attending: Family Medicine | Admitting: Family Medicine

## 2021-01-17 ENCOUNTER — Ambulatory Visit (INDEPENDENT_AMBULATORY_CARE_PROVIDER_SITE_OTHER): Payer: Medicare Other | Admitting: Family Medicine

## 2021-01-17 VITALS — BP 136/82 | HR 90 | Temp 98.7°F | Resp 16 | Ht 71.0 in | Wt 215.0 lb

## 2021-01-17 DIAGNOSIS — R109 Unspecified abdominal pain: Secondary | ICD-10-CM | POA: Diagnosis not present

## 2021-01-17 DIAGNOSIS — R0781 Pleurodynia: Secondary | ICD-10-CM | POA: Diagnosis not present

## 2021-01-17 DIAGNOSIS — G8929 Other chronic pain: Secondary | ICD-10-CM

## 2021-01-17 DIAGNOSIS — I6930 Unspecified sequelae of cerebral infarction: Secondary | ICD-10-CM | POA: Diagnosis not present

## 2021-01-17 DIAGNOSIS — M545 Low back pain, unspecified: Secondary | ICD-10-CM | POA: Diagnosis not present

## 2021-01-17 DIAGNOSIS — I700 Atherosclerosis of aorta: Secondary | ICD-10-CM | POA: Diagnosis not present

## 2021-01-17 LAB — URINALYSIS, ROUTINE W REFLEX MICROSCOPIC
Bilirubin Urine: NEGATIVE
Glucose, UA: NEGATIVE
Hgb urine dipstick: NEGATIVE
Ketones, ur: NEGATIVE
Leukocytes,Ua: NEGATIVE
Nitrite: NEGATIVE
Protein, ur: NEGATIVE
Specific Gravity, Urine: 1.025 (ref 1.001–1.035)
pH: 6 (ref 5.0–8.0)

## 2021-01-17 MED ORDER — TIZANIDINE HCL 4 MG PO TABS: 4.0000 mg | ORAL_TABLET | Freq: Four times a day (QID) | ORAL | 0 refills | Status: DC | PRN

## 2021-01-17 NOTE — Progress Notes (Signed)
Subjective:    Patient ID: Chad Serrano, male    DOB: 16-Mar-1946, 75 y.o.   MRN: [id number]  HPI MRI 2015 IMPRESSION:  Non-compressive disc bulges at L1-2 and L3-4.   Severe multifactorial spinal stenosis at L4-5. Advanced bilateral  facet arthropathy allowing 4 mm of anterolisthesis. Circumferential  bulging of the disc. Neural compression could occur in the central  canal or the foramina. The facet arthropathy could also be a cause  of pain.   Disc degeneration at L5-S1 and mild facet hypertrophy. Mild stenosis  of the subarticular lateral recesses and neural foramina that could  possibly be symptomatic.   Patient has a history of melanoma in addition to his severe spinal stenosis.  He is receiving cortisone injections every 3 months.  This usually alleviates his low back pain.  However his pain is typically lower at the level of L5.  It radiates into both legs.  He now presents with a pain that is different.  This pain has been going on for approximately 6 months.  Is located in his left flank roughly at the level of L2 and L3.  However its over to the left-hand side.  Is not located over the spinous processes.  The patient exercises every day and he states that the exercise does not exacerbate the pain.  The pain is constantly there.  It does seem to get worse if he is doing more bending over or stretching or range of motion type activities leading me to believe is likely muscular.  However he is concerned given his history of melanoma about possible cancer given the fact the pain is not improving.  There is no tenderness to palpation over the ribs.  There is no tenderness to palpation over the iliac crest.  There is no tenderness to palpation over the spinous processes.  I am unable to reproduce the pain just with palpation in the left flank.  He denies any hematuria or dysuria or urgency or frequency.  He denies any melena or hematochezia.  He denies any fevers or chills. Past Medical  History:  Diagnosis Date   Anxiety    Chronic kidney disease    Gout    Hyperlipidemia    Hypertension    Hypogonadism male    Hypogonadism male    Melanoma ([place])    at T4 surgically excised at [facility] (2020) free margins and sentinel node biopsy negative   Melanoma ([place])    Stroke (HCC)    cryptogenic, right retinal artery occlusion   Past Surgical History:  Procedure Laterality Date   APPENDECTOMY     COLONOSCOPY     GALLBLADDER SURGERY  05/2020   ROTATOR CUFF REPAIR Right    Current Outpatient Medications on File Prior to Visit  Medication Sig Dispense Refill   allopurinol (ZYLOPRIM) 100 MG tablet TAKE 2 TABLETS BY MOUTH EVERY DAY 180 tablet 3   Ascorbic Acid (VITAMIN C) 1000 MG tablet Take 1,000 mg by mouth daily.     atorvastatin (LIPITOR) 40 MG tablet TAKE 1 TABLET BY MOUTH DAILY AT 6 PM 90 tablet 2   betamethasone dipropionate 0.05 % cream Apply topically 2 (two) times daily.     Cholecalciferol (VITAMIN D3) 2000 UNITS TABS Take 2,000 Units by mouth daily.      clopidogrel (PLAVIX) 75 MG tablet Take 1 tablet (75 mg total) by mouth daily. 90 tablet 3   L-Arginine 500 MG TABS Take 500 mg by mouth daily.  losartan (COZAAR) 100 MG tablet TAKE 1 TABLET BY MOUTH EVERY DAY 90 tablet 2   Magnesium 500 MG CAPS Take 500 mg by mouth daily.      Multiple Vitamins-Minerals (CENTRUM SILVER ADULT 50+) TABS Take 1 tablet by mouth daily.      Omega-3 Fatty Acids (FISH OIL) 1000 MG CAPS Take 2,000 mg by mouth daily.      traMADol (ULTRAM) 50 MG tablet Take 50 mg by mouth every 12 (twelve) hours as needed for moderate pain.     vitamin B-12 (CYANOCOBALAMIN) 1000 MCG tablet Take 1,000 mcg by mouth daily.      Zinc 50 MG TABS Take 50 mg by mouth daily.      No current facility-administered medications on file prior to visit.   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     Objective:   Physical Exam Constitutional:      Appearance: He is well-developed.  HENT:     Head: Normocephalic and atraumatic.  Cardiovascular:     Rate and Rhythm: Normal rate and regular rhythm.     Heart sounds: Normal heart sounds.  Pulmonary:     Effort: Pulmonary effort is normal. No respiratory distress.     Breath sounds: Normal breath sounds. No wheezing or rales.  Abdominal:     General: Bowel sounds are normal. There is no distension.     Palpations: Abdomen is soft. There is no mass.     Tenderness: There is no abdominal tenderness. There is no guarding or rebound.     Hernia: There is no hernia in the left inguinal area.  Genitourinary:    Testes: Normal.  Musculoskeletal:     Cervical back: Normal range of motion and neck supple.     Lumbar back: No swelling, deformity, signs of trauma, spasms, tenderness or bony tenderness. Normal range of motion.       Back:  Lymphadenopathy:     Lower Body: No right inguinal adenopathy. No left inguinal adenopathy.          Assessment & Plan:  Left flank pain, chronic - Plan: DG Lumbar Spine Complete, DG Ribs Unilateral Left, Urinalysis, Routine w reflex microscopic, CBC with  Differential/Platelet, COMPLETE METABOLIC PANEL WITH GFR This pain is different than the pain the patient experiences due to his spinal stenosis.  He has not had any imaging of his lower back since 2015.  My suspicion that this is likely muscular and  represents a chronic muscle injury this getting exacerbated just due to daily range of motion activities.  However I would like to obtain a baseline x-ray of the lumbar spine as well as of the left ribs given its location to rule out any lytic lesions on the skeleton or on the ribs that could explain bone pain.  I would also obtain an CMP to evaluate for any evidence of an elevated alkaline phosphatase.  I am doing this primarily due to his history of melanoma to ensure that there is no evidence of any skeletal metastases.  Obtain a CMP to also check renal function.  Obtain a urinalysis to rule out hematuria.  Check CBC to evaluate for any leukocytosis.  I will treat the patient empirically with Zanaflex as a muscle relaxer to see if this helps.  If the Zanaflex helps, we could even consider formal physical therapy to try to help with chronic muscle pain.  If the Zanaflex does not help at all and/or imaging or lab work suggest an underlying issue, we may need more extensive imaging of this area.

## 2021-01-18 LAB — CBC WITH DIFFERENTIAL/PLATELET
Absolute Monocytes: 614 cells/uL (ref 200–950)
Basophils Absolute: 68 cells/uL (ref 0–200)
Basophils Relative: 1.1 %
Eosinophils Absolute: 87 cells/uL (ref 15–500)
Eosinophils Relative: 1.4 %
HCT: 42.5 % (ref 38.5–50.0)
Hemoglobin: 14.3 g/dL (ref 13.2–17.1)
Lymphs Abs: 2604 cells/uL (ref 850–3900)
MCH: 33 pg (ref 27.0–33.0)
MCHC: 33.6 g/dL (ref 32.0–36.0)
MCV: 98.2 fL (ref 80.0–100.0)
MPV: 11.4 fL (ref 7.5–12.5)
Monocytes Relative: 9.9 %
Neutro Abs: 2827 cells/uL (ref 1500–7800)
Neutrophils Relative %: 45.6 %
Platelets: 144 10*3/uL (ref 140–400)
RBC: 4.33 10*6/uL (ref 4.20–5.80)
RDW: 12.7 % (ref 11.0–15.0)
Total Lymphocyte: 42 %
WBC: 6.2 10*3/uL (ref 3.8–10.8)

## 2021-01-18 LAB — COMPLETE METABOLIC PANEL WITH GFR
AG Ratio: 1.9 (calc) (ref 1.0–2.5)
ALT: 23 U/L (ref 9–46)
AST: 20 U/L (ref 10–35)
Albumin: 3.7 g/dL (ref 3.6–5.1)
Alkaline phosphatase (APISO): 68 U/L (ref 35–144)
BUN: 16 mg/dL (ref 7–25)
CO2: 28 mmol/L (ref 20–32)
Calcium: 8.6 mg/dL (ref 8.6–10.3)
Chloride: 106 mmol/L (ref 98–110)
Creat: 1.2 mg/dL (ref 0.70–1.28)
Globulin: 1.9 g/dL (calc) (ref 1.9–3.7)
Glucose, Bld: 110 mg/dL — ABNORMAL HIGH (ref 65–99)
Potassium: 4.2 mmol/L (ref 3.5–5.3)
Sodium: 141 mmol/L (ref 135–146)
Total Bilirubin: 1.3 mg/dL — ABNORMAL HIGH (ref 0.2–1.2)
Total Protein: 5.6 g/dL — ABNORMAL LOW (ref 6.1–8.1)
eGFR: 63 mL/min/{1.73_m2} (ref >=60)

## 2021-01-19 ENCOUNTER — Other Ambulatory Visit: Payer: Self-pay | Admitting: *Deleted

## 2021-01-19 DIAGNOSIS — G8929 Other chronic pain: Secondary | ICD-10-CM

## 2021-01-19 DIAGNOSIS — M47816 Spondylosis without myelopathy or radiculopathy, lumbar region: Secondary | ICD-10-CM

## 2021-01-19 DIAGNOSIS — R109 Unspecified abdominal pain: Secondary | ICD-10-CM

## 2021-01-19 DIAGNOSIS — M5136 Other intervertebral disc degeneration, lumbar region: Secondary | ICD-10-CM

## 2021-01-19 DIAGNOSIS — M48061 Spinal stenosis, lumbar region without neurogenic claudication: Secondary | ICD-10-CM

## 2021-01-24 DIAGNOSIS — M48061 Spinal stenosis, lumbar region without neurogenic claudication: Secondary | ICD-10-CM | POA: Diagnosis not present

## 2021-01-24 DIAGNOSIS — M5416 Radiculopathy, lumbar region: Secondary | ICD-10-CM | POA: Diagnosis not present

## 2021-01-24 DIAGNOSIS — M4316 Spondylolisthesis, lumbar region: Secondary | ICD-10-CM | POA: Diagnosis not present

## 2021-03-28 DIAGNOSIS — L812 Freckles: Secondary | ICD-10-CM | POA: Diagnosis not present

## 2021-03-28 DIAGNOSIS — D2339 Other benign neoplasm of skin of other parts of face: Secondary | ICD-10-CM | POA: Diagnosis not present

## 2021-03-28 DIAGNOSIS — L821 Other seborrheic keratosis: Secondary | ICD-10-CM | POA: Diagnosis not present

## 2021-03-28 DIAGNOSIS — L400 Psoriasis vulgaris: Secondary | ICD-10-CM | POA: Diagnosis not present

## 2021-03-28 DIAGNOSIS — Z85820 Personal history of malignant melanoma of skin: Secondary | ICD-10-CM | POA: Diagnosis not present

## 2021-03-28 DIAGNOSIS — D485 Neoplasm of uncertain behavior of skin: Secondary | ICD-10-CM | POA: Diagnosis not present

## 2021-04-03 DIAGNOSIS — M5416 Radiculopathy, lumbar region: Secondary | ICD-10-CM | POA: Diagnosis not present

## 2021-05-03 ENCOUNTER — Encounter: Payer: Self-pay | Admitting: Nurse Practitioner

## 2021-05-03 ENCOUNTER — Other Ambulatory Visit: Payer: Self-pay

## 2021-05-03 ENCOUNTER — Ambulatory Visit (INDEPENDENT_AMBULATORY_CARE_PROVIDER_SITE_OTHER): Payer: Medicare Other | Admitting: Nurse Practitioner

## 2021-05-03 VITALS — BP 162/98 | HR 88 | Ht 71.0 in | Wt 218.0 lb

## 2021-05-03 DIAGNOSIS — H7392 Unspecified disorder of tympanic membrane, left ear: Secondary | ICD-10-CM | POA: Diagnosis not present

## 2021-05-03 DIAGNOSIS — H6122 Impacted cerumen, left ear: Secondary | ICD-10-CM | POA: Diagnosis not present

## 2021-05-03 MED ORDER — AMOXICILLIN-POT CLAVULANATE 875-125 MG PO TABS: 1.0000 | ORAL_TABLET | Freq: Two times a day (BID) | ORAL | 0 refills | Status: AC

## 2021-05-03 NOTE — Progress Notes (Signed)
Subjective:    Patient ID: Chad Serrano, male    DOB: November 08, 1945, 75 y.o.   MRN: [id number]  HPI: Chad Serrano is a 75 y.o. male presenting for ear clogged on left side.  Chief Complaint  Patient presents with   Hearing Problem    Left side   EAG CLOGGED Duration: weeks Involved ear(s): left ear , right some  Sensation of feeling clogged/plugged: yes Decreased/muffled hearing:yes Ear pain: no Fever: no Otorrhea: no Hearing loss: no Upper respiratory infection symptoms: yes; sinus pressure in forehead Using Q-Tips: yes Status: better History of cerumenosis: yes Treatments attempted:  Mucinex  BP today at home 132/80.  Reports BP is always high in the office.  No Known Allergies  Outpatient Encounter Medications as of 05/03/2021  Medication Sig   allopurinol (ZYLOPRIM) 100 MG tablet TAKE 2 TABLETS BY MOUTH EVERY DAY   amoxicillin-clavulanate (AUGMENTIN) 875-125 MG tablet Take 1 tablet by mouth 2 (two) times daily for 7 days.   Ascorbic Acid (VITAMIN C) 1000 MG tablet Take 1,000 mg by mouth daily.   atorvastatin (LIPITOR) 40 MG tablet TAKE 1 TABLET BY MOUTH DAILY AT 6 PM   betamethasone dipropionate 0.05 % cream Apply topically 2 (two) times daily.   Cholecalciferol (VITAMIN D3) 2000 UNITS TABS Take 2,000 Units by mouth daily.    clopidogrel (PLAVIX) 75 MG tablet Take 1 tablet (75 mg total) by mouth daily.   L-Arginine 500 MG TABS Take 500 mg by mouth daily.    losartan (COZAAR) 100 MG tablet TAKE 1 TABLET BY MOUTH EVERY DAY   Magnesium 500 MG CAPS Take 500 mg by mouth daily.    Omega-3 Fatty Acids (FISH OIL) 1000 MG CAPS Take 2,000 mg by mouth daily.    tiZANidine (ZANAFLEX) 4 MG tablet Take 1 tablet (4 mg total) by mouth every 6 (six) hours as needed for muscle spasms.   traMADol (ULTRAM) 50 MG tablet Take 50 mg by mouth every 12 (twelve) hours as needed for moderate pain.   vitamin B-12 (CYANOCOBALAMIN) 1000 MCG tablet Take 1,000 mcg by mouth daily.    Zinc 50  MG TABS Take 50 mg by mouth daily.    No facility-administered encounter medications on file as of 05/03/2021.    Patient Active Problem List   Diagnosis Date Noted   Stroke [facility])    Loss of vision 05/26/2019   Cryptogenic stroke ([place]) 05/26/2019   Retinal artery occlusion 05/25/2019   Melanoma ([place])    Pulmonary infiltrates 10/02/2018   Hypogonadism male 08/05/2012   Other and unspecified hyperlipidemia 08/05/2012   Hypertension    Gout     Past Medical History:  Diagnosis Date   Anxiety    Chronic kidney disease    Gout    Hyperlipidemia    Hypertension    Hypogonadism male    Hypogonadism male    Melanoma ([place])    at T4 surgically excised at [facility] (2020) free margins and sentinel node biopsy negative   Melanoma ([place])    Stroke (HCC)    cryptogenic, right retinal artery occlusion    Relevant past medical, surgical, family and social history reviewed and updated as indicated. Interim medical history since our last visit reviewed.  Review of Systems Per HPI unless specifically indicated above     Objective:    BP (!) 162/98    Pulse 88    Ht 5\' 11"  (1.803 m)    Wt 218 lb (98.9 kg)    SpO2  99%    BMI 30.40 kg/m   Wt Readings from Last 3 Encounters:  05/03/21 218 lb (98.9 kg)  01/17/21 215 lb (97.5 kg)  09/20/20 217 lb (98.4 kg)    Physical Exam Vitals and nursing note reviewed.  Constitutional:      General: He is not in acute distress.    Appearance: Normal appearance. He is not toxic-appearing.  HENT:     Head: Normocephalic and atraumatic.     Right Ear: Tympanic membrane, ear canal and external ear normal.     Left Ear: There is impacted cerumen.     Nose: Nose normal. No congestion.     Mouth/Throat:     Mouth: Mucous membranes are moist.     Pharynx: Oropharynx is clear. No oropharyngeal exudate or posterior oropharyngeal erythema.  Eyes:     General: No scleral icterus.    Extraocular Movements: Extraocular movements intact.  Skin:    General:  Skin is warm and dry.     Coloration: Skin is not jaundiced or pale.     Findings: No erythema.  Neurological:     Mental Status: He is alert and oriented to person, place, and time.      Assessment & Plan:  1. Excessive cerumen in left ear canal Acute.  Ear lavage to left ear removed most of cerumen from ear canal.  Tympanic membrane visualized and slightly erythematous, not bulging.  Patient tolerated procedure well.  Start Augmentin if symptoms do not improve tomorrow.  Advised to stop using Q-tips.    - Ear Lavage  2. Irritation of tympanic membrane of left ear  - amoxicillin-clavulanate (AUGMENTIN) 875-125 MG tablet; Take 1 tablet by mouth 2 (two) times daily for 7 days.  Dispense: 14 tablet; Refill: 0   Follow up plan: Return if symptoms worsen or fail to improve.

## 2021-06-20 ENCOUNTER — Ambulatory Visit (INDEPENDENT_AMBULATORY_CARE_PROVIDER_SITE_OTHER): Payer: Medicare Other | Admitting: Family Medicine

## 2021-06-20 ENCOUNTER — Encounter: Payer: Self-pay | Admitting: Family Medicine

## 2021-06-20 ENCOUNTER — Other Ambulatory Visit: Payer: Self-pay

## 2021-06-20 VITALS — BP 162/98 | HR 84 | Temp 97.3°F | Resp 18 | Ht 71.0 in | Wt 218.0 lb

## 2021-06-20 DIAGNOSIS — R053 Chronic cough: Secondary | ICD-10-CM | POA: Diagnosis not present

## 2021-06-20 MED ORDER — PANTOPRAZOLE SODIUM 40 MG PO TBEC: 40.0000 mg | DELAYED_RELEASE_TABLET | Freq: Every day | ORAL | 3 refills | Status: DC

## 2021-06-20 NOTE — Progress Notes (Signed)
Subjective:    Patient ID: Chad Serrano, male    DOB: [birth year], 76 y.o.   MRN: [id number]  HPI  Patient is a very pleasant 76 year old Caucasian gentleman who presents today with a cough.  He states that he has had a cough off and on since he had COVID.  It tends to occur after meals.  He also feels that he constantly has to clear his throat.  He feels like there is mucus around his vocal cords.  He denies any fever.  He denies any chills.  He denies any shortness of breath.  He is working out.  He states that he feels well.  He has plenty of energy and normal stamina.  The cough actually gets better when he is exercising.  Tends to be worse at night and after meals.  He also reports postnasal drip and rhinorrhea with meals.  He denies any sinus pain or headaches.  He denies any melena or hematochezia.  He denies any acid reflux.  In 2020, a CT scan was obtained of his lungs that showed postinflammatory nodularity: IMPRESSION: 1. Curvilinear parenchymal band in the right middle lobe, significantly decreased in thickness, compatible with evolution of postinfectious/postinflammatory scarring. No lung masses. 2. Mediastinal and right hilar lymphadenopathy has resolved. 3. Persistent mild patchy ground-glass centrilobular micronodularity throughout both lungs, substantially decreased in the interval. Assuming the patient is not a current smoker, these findings could represent subacute hypersensitivity pneumonitis that has substantially improved/is resolving. If the patient is a current smoker, findings would be most compatible with smoking related respiratory bronchiolitis (RB-ILD).  Patient was referred to pulmonology and was treated with [facility].  Cough gradually improved.  This occurred after he had pneumonia in the right middle lobe.  However he states that he feels much better than when he had this cough in 2020.  Past Medical History:  Diagnosis Date   Anxiety    Chronic  kidney disease    Gout    Hyperlipidemia    Hypertension    Hypogonadism male    Hypogonadism male    Melanoma ([place])    at T4 surgically excised at [facility] (2020) free margins and sentinel node biopsy negative   Melanoma ([place])    Stroke (HCC)    cryptogenic, right retinal artery occlusion   Past Surgical History:  Procedure Laterality Date   APPENDECTOMY     COLONOSCOPY     GALLBLADDER SURGERY  05/2020   ROTATOR CUFF REPAIR Right    Current Outpatient Medications on File Prior to Visit  Medication Sig Dispense Refill   allopurinol (ZYLOPRIM) 100 MG tablet TAKE 2 TABLETS BY MOUTH EVERY DAY 180 tablet 3   Ascorbic Acid (VITAMIN C) 1000 MG tablet Take 1,000 mg by mouth daily.     atorvastatin (LIPITOR) 40 MG tablet TAKE 1 TABLET BY MOUTH DAILY AT 6 PM 90 tablet 2   betamethasone dipropionate 0.05 % cream Apply topically 2 (two) times daily.     Cholecalciferol (VITAMIN D3) 2000 UNITS TABS Take 2,000 Units by mouth daily.      clopidogrel (PLAVIX) 75 MG tablet Take 1 tablet (75 mg total) by mouth daily. 90 tablet 3   desoximetasone (TOPICORT) 0.25 % cream APPLY SPARINGLY TO AFFECTED AREA TWICE A DAY     L-Arginine 500 MG TABS Take 500 mg by mouth daily.      losartan (COZAAR) 100 MG tablet TAKE 1 TABLET BY MOUTH EVERY DAY 90 tablet 2   Magnesium 500 MG  CAPS Take 500 mg by mouth daily.      Omega-3 Fatty Acids (FISH OIL) 1000 MG CAPS Take 2,000 mg by mouth daily.      tiZANidine (ZANAFLEX) 4 MG tablet Take 1 tablet (4 mg total) by mouth every 6 (six) hours as needed for muscle spasms. 30 tablet 0   traMADol (ULTRAM) 50 MG tablet Take 50 mg by mouth every 12 (twelve) hours as needed for moderate pain.     vitamin B-12 (CYANOCOBALAMIN) 1000 MCG tablet Take 1,000 mcg by mouth daily.      Zinc 50 MG TABS Take 50 mg by mouth daily.      No current facility-administered medications on file prior to visit.   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     Objective:   Physical Exam Vitals reviewed.  Constitutional:      General: He is not in acute distress.    Appearance: Normal appearance. He is not ill-appearing, toxic-appearing or diaphoretic.  HENT:     Head: Normocephalic and atraumatic.     Right Ear: Tympanic membrane and ear canal normal. There is no impacted cerumen.     Left Ear: Tympanic membrane and ear canal normal. There is no impacted cerumen.     Nose: No congestion or rhinorrhea.     Mouth/Throat:     Mouth: Mucous membranes are moist.     Pharynx: Oropharynx is clear. No oropharyngeal exudate or posterior oropharyngeal erythema.  Eyes:     General: No scleral icterus.       Right eye: No discharge.        Left eye: No discharge.     Extraocular Movements: Extraocular movements intact.     Conjunctiva/sclera: Conjunctivae normal.     Pupils: Pupils are equal, round, and reactive to light.  Neck:     Vascular: No carotid bruit.   Cardiovascular:     Rate and Rhythm: Normal rate and regular rhythm.     Heart sounds: No murmur heard.   No friction rub. No gallop.  Pulmonary:     Effort: Pulmonary effort is normal. No respiratory distress.     Breath sounds: Normal breath sounds. No stridor. No  wheezing, rhonchi or rales.  Chest:     Chest wall: No tenderness.  Abdominal:     General: Abdomen is flat. Bowel sounds are normal. There is no distension.     Palpations: Abdomen is soft.     Tenderness: There is no abdominal tenderness. There is no right CVA tenderness, left CVA tenderness, guarding or rebound.     Hernia: No hernia is present.  Musculoskeletal:     Cervical back: Normal range of motion and neck supple. No rigidity or tenderness.     Right lower leg: No edema.     Left lower leg: No edema.  Lymphadenopathy:     Cervical: No cervical adenopathy.  Skin:    General: Skin is warm.     Coloration: Skin is not jaundiced or pale.     Findings: No bruising, erythema, lesion or rash.  Neurological:     General: No focal deficit present.     Mental Status: He is alert and oriented to person, place, and time. Mental status is at baseline.     Cranial Nerves: No cranial nerve deficit.     Sensory: No sensory deficit.     Motor: No weakness.     Coordination: Coordination normal.     Gait: Gait normal.     Deep Tendon Reflexes: Reflexes normal.  Psychiatric:        Mood and Affect: Mood normal.        Behavior: Behavior normal.        Thought Content: Thought content normal.        Judgment: Judgment normal.      Assessment & Plan:  Chronic cough Exam today is normal.  I suspect that the chronic cough is due to laryngal esophageal reflux.  Recommended trying Protonix 40 mg a day and then reassess in 3 to 4 weeks.  If worsening, I would recommend imaging of the chest to evaluate further however the patient appears clinically well.  There is no evidence of pneumonia today on examination.  He is nontoxic.   Healthy appearing and feels excellent.  Therefore I do not feel that there is any type of infection at work.  I truly believe the patient has an irritant cough.  Also on the differential diagnosis of the postnasal drip/upper airway cough syndrome

## 2021-07-11 DIAGNOSIS — C4359 Malignant melanoma of other part of trunk: Secondary | ICD-10-CM | POA: Diagnosis not present

## 2021-07-13 DIAGNOSIS — M5416 Radiculopathy, lumbar region: Secondary | ICD-10-CM | POA: Diagnosis not present

## 2021-07-14 ENCOUNTER — Encounter: Payer: Self-pay | Admitting: Family Medicine

## 2021-08-02 DIAGNOSIS — Z20822 Contact with and (suspected) exposure to covid-19: Secondary | ICD-10-CM | POA: Diagnosis not present

## 2021-08-15 DIAGNOSIS — H3411 Central retinal artery occlusion, right eye: Secondary | ICD-10-CM | POA: Diagnosis not present

## 2021-08-15 DIAGNOSIS — H524 Presbyopia: Secondary | ICD-10-CM | POA: Diagnosis not present

## 2021-08-15 DIAGNOSIS — H5203 Hypermetropia, bilateral: Secondary | ICD-10-CM | POA: Diagnosis not present

## 2021-08-15 DIAGNOSIS — H52222 Regular astigmatism, left eye: Secondary | ICD-10-CM | POA: Diagnosis not present

## 2021-08-15 DIAGNOSIS — H2513 Age-related nuclear cataract, bilateral: Secondary | ICD-10-CM | POA: Diagnosis not present

## 2021-08-16 ENCOUNTER — Other Ambulatory Visit: Payer: Self-pay | Admitting: Family Medicine

## 2021-09-16 ENCOUNTER — Other Ambulatory Visit: Payer: Self-pay | Admitting: Family Medicine

## 2021-09-18 NOTE — Telephone Encounter (Signed)
Requested medications are due for refill today.  Unsure ? ?Requested medications are on the active medications list.  yes ? ?Last refill. 06/20/2021 #30 3 refills ? ?Future visit scheduled.   No.  ? ?Notes to clinic.  Note from pt on 07/14/2021 states that medication is not helping his sinus issues. Unsure if pt is to continue this medication. ? ? ? ?Requested Prescriptions  ?Pending Prescriptions Disp Refills  ? pantoprazole (PROTONIX) 40 MG tablet [Pharmacy Med Name: PANTOPRAZOLE SOD DR 40 MG TAB] 90 tablet 1  ?  Sig: TAKE 1 TABLET BY MOUTH EVERY DAY  ?  ? Gastroenterology: Proton Pump Inhibitors Passed - 09/16/2021  2:48 AM  ?  ?  Passed - Valid encounter within last 12 months  ?  Recent Outpatient Visits   ? ?      ? 3 months ago Chronic cough  ? [place] Pickard, Cammie Mcgee, MD  ? 4 months ago Excessive cerumen in left ear canal  ? [place], Jessica A, NP  ? 8 months ago Left flank pain, chronic  ? [facility] Pickard, Cammie Mcgee, MD  ? 12 months ago Cryptogenic stroke [facility])  ? [facility] Pickard, Cammie Mcgee, MD  ? 1 year ago Wound of left cheek, initial encounter  ? Aubrey Eulogio Bear, NP  ? ?  ?  ? ? ?  ?  ?  ?  ?

## 2021-09-22 ENCOUNTER — Ambulatory Visit (INDEPENDENT_AMBULATORY_CARE_PROVIDER_SITE_OTHER): Payer: Medicare Other

## 2021-09-22 VITALS — Ht 71.0 in | Wt 218.0 lb

## 2021-09-22 DIAGNOSIS — Z0000 Encounter for general adult medical examination without abnormal findings: Secondary | ICD-10-CM | POA: Diagnosis not present

## 2021-09-22 NOTE — Progress Notes (Signed)
? ?Subjective:  ? Chad Serrano is a 76 y.o. male who presents for Medicare Annual/Subsequent preventive examination. ?Virtual Visit via Telephone Note ? ?I connected with  [name] on 09/22/21 at  2:00 PM EDT by telephone and verified that I am speaking with the correct person using two identifiers. ? ?Location: ?Patient: HOME ?Provider: [facility] ?Persons participating in the virtual visit: patient/Nurse Health Advisor ?  ?I discussed the limitations, risks, security and privacy concerns of performing an evaluation and management service by telephone and the availability of in person appointments. The patient expressed understanding and agreed to proceed. ? ?Interactive audio and video telecommunications were attempted between this nurse and patient, however failed, due to patient having technical difficulties OR patient did not have access to video capability.  We continued and completed visit with audio only. ? ?Some vital signs may be absent or patient reported.  ? ?Chriss Driver, LPN ? ?Review of Systems    ? ?Cardiac Risk Factors include: advanced age (>13mn, >>50women);dyslipidemia;hypertension;male gender;sedentary lifestyle;obesity (BMI >30kg/m2) ? ?   ?Objective:  ?  ?Today's Vitals  ? 09/22/21 1402  ?Weight: 218 lb (98.9 kg)  ?Height: '5\' 11"'$  (1.803 m)  ? ?Body mass index is 30.4 kg/m?. ? ? ?  09/22/2021  ?  2:09 PM 09/20/2020  ? 10:31 AM 04/28/2020  ?  7:15 AM 08/15/2017  ?  9:16 AM  ?Advanced Directives  ?Does Patient Have a Medical Advance Directive? No Yes No Yes  ?Does patient want to make changes to medical advance directive?  No - Patient declined    ?Would patient like information on creating a medical advance directive? No - Patient declined  No - Patient declined   ? ? ?Current Medications (verified) ?Outpatient Encounter Medications as of 09/22/2021  ?Medication Sig  ? allopurinol (ZYLOPRIM) 100 MG tablet TAKE 2 TABLETS BY MOUTH EVERY DAY  ? Ascorbic Acid (VITAMIN C) 1000 MG tablet Take  1,000 mg by mouth daily.  ? atorvastatin (LIPITOR) 40 MG tablet TAKE 1 TABLET BY MOUTH DAILY AT 6 PM  ? betamethasone dipropionate 0.05 % cream Apply topically 2 (two) times daily.  ? Cholecalciferol (VITAMIN D3) 2000 UNITS TABS Take 2,000 Units by mouth daily.   ? clopidogrel (PLAVIX) 75 MG tablet TAKE 1 TABLET BY MOUTH EVERY DAY  ? desoximetasone (TOPICORT) 0.25 % cream APPLY SPARINGLY TO AFFECTED AREA TWICE A DAY  ? L-Arginine 500 MG TABS Take 500 mg by mouth daily.   ? losartan (COZAAR) 100 MG tablet TAKE 1 TABLET BY MOUTH EVERY DAY  ? Magnesium 500 MG CAPS Take 500 mg by mouth daily.   ? Omega-3 Fatty Acids (FISH OIL) 1000 MG CAPS Take 2,000 mg by mouth daily.   ? pantoprazole (PROTONIX) 40 MG tablet TAKE 1 TABLET BY MOUTH EVERY DAY  ? tiZANidine (ZANAFLEX) 4 MG tablet Take 1 tablet (4 mg total) by mouth every 6 (six) hours as needed for muscle spasms.  ? traMADol (ULTRAM) 50 MG tablet Take 50 mg by mouth every 12 (twelve) hours as needed for moderate pain.  ? vitamin B-12 (CYANOCOBALAMIN) 1000 MCG tablet Take 1,000 mcg by mouth daily.   ? Zinc 50 MG TABS Take 50 mg by mouth daily.   ? ?No facility-administered encounter medications on file as of 09/22/2021.  ? ? ?Allergies (verified) ?Patient has no known allergies.  ? ?History: ?Past Medical History:  ?Diagnosis Date  ? Anxiety   ? Chronic kidney disease   ? Gout   ?  Hyperlipidemia   ? Hypertension   ? Hypogonadism male   ? Hypogonadism male   ? Melanoma ([place])   ? at T4 surgically excised at [facility] (2020) free margins and sentinel node biopsy negative  ? Melanoma ([place])   ? Stroke [facility])   ? cryptogenic, right retinal artery occlusion  ? ?Past Surgical History:  ?Procedure Laterality Date  ? APPENDECTOMY    ? CHOLECYSTECTOMY  06/14/2020  ? COLONOSCOPY    ? GALLBLADDER SURGERY  05/2020  ? ROTATOR CUFF REPAIR Right   ? ?Family History  ?Problem Relation Age of Onset  ? Colon polyps Father   ? Cancer Mother   ? Colon cancer Neg Hx   ? Esophageal cancer Neg Hx   ?  Rectal cancer Neg Hx   ? Stomach cancer Neg Hx   ? ?Social History  ? ?Socioeconomic History  ? Marital status: Married  ?  Spouse name: Not on file  ? Number of children: Not on file  ? Years of education: Not on file  ? Highest education level: Not on file  ?Occupational History  ? Not on file  ?Tobacco Use  ? Smoking status: Never  ? Smokeless tobacco: Never  ?Substance and Sexual Activity  ? Alcohol use: No  ? Drug use: No  ? Sexual activity: Not Currently  ?  Birth control/protection: None  ?  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 Not on file  ?Social History Narrative  ? Not on file  ? ?Social Determinants of Health  ? ?Financial Resource Strain: Low Risk   ? Difficulty of Paying Living Expenses: Not hard at all  ?Food Insecurity: No Food Insecurity  ? Worried About Charity fundraiser in the Last Year: Never true  ? Ran Out of Food in the Last Year: Never true  ?Transportation Needs: No Transportation Needs  ? Lack of Transportation (Medical): No  ? Lack of Transportation (Non-Medical): No  ?Physical Activity: Sufficiently Active  ? Days of Exercise per Week: 3 days  ? Minutes of Exercise per Session: 90 min  ?Stress: No Stress Concern Present  ? Feeling of Stress : Not at all  ?Social Connections: Socially Integrated  ? Frequency of Communication with Friends and Family: More than three times a week  ? Frequency of Social Gatherings with Friends and Family: More than three times a week  ? Attends Religious Services: More than 4 times per year  ? Active Member of Clubs or Organizations: Yes  ? Attends Archivist Meetings: More than 4 times per year  ? Marital Status: Married  ? ? ?Tobacco Counseling ?Counseling given: Not Answered ? ? ?Clinical Intake: ? ?Pre-visit preparation completed: Yes ? ?Pain : No/denies pain ? ?  ? ?BMI - recorded: 30.4 ?Nutritional Status: BMI > 30  Obese ?Nutritional Risks: None ?Diabetes: No ? ?How often do you need to have someone  help you when you read instructions, pamphlets, or other written materials from your doctor or pharmacy?: 1 - Never ? ?Diabetic?NO ? ?Interpreter Needed?: No ? ?Information entered by :: mj Kalei Meda, lpn ? ? ?Activities of Daily Living ? ?  09/22/2021  ?  2:12 PM  ?In your present state of health, do you have any difficulty performing the following activities:  ?Hearing? 0  ?Vision? 0  ?Difficulty concentrating or making decisions? 0  ?Walking or climbing stairs? 0  ?Dressing or bathing? 0  ?Doing errands, shopping? 0  ?Preparing  Food and eating ? N  ?Using the Toilet? N  ?In the past six months, have you accidently leaked urine? N  ?Do you have problems with loss of bowel control? N  ?Managing your Medications? N  ?Managing your Finances? N  ?Housekeeping or managing your Housekeeping? N  ? ? ?Patient Care Team: ?Susy Frizzle, MD as PCP - General (Family Medicine) ?Thompson Grayer, MD as PCP - Cardiology (Cardiology) ?Edythe Clarity, [facility] as Pharmacist (Pharmacist) ? ?Indicate any recent Medical Services you may have received from other than [facility] providers in the past year (date may be approximate). ? ?   ?Assessment:  ? This is a routine wellness examination for [place]. ? ?Hearing/Vision screen ?Hearing Screening - Comments:: No hearing issues.  ?Vision Screening - Comments:: Glasses. Dr. Sabra Heck 07/2021. ? ?Dietary issues and exercise activities discussed: ?Current Exercise Habits: Home exercise routine, Type of exercise: strength training/weights;Other - see comments (stationary bike), Time (Minutes): 60 (Pt exercises for 1.5 hours 3x per week.), Frequency (Times/Week): 3, Weekly Exercise (Minutes/Week): 180, Intensity: Moderate, Exercise limited by: cardiac condition(s) ? ? Goals Addressed   ? ?  ?  ?  ?  ? This Visit's Progress  ?  Exercise 3x per week (30 min per time)     ?  Continue to stay active and stay healthy.  ?  ? ?  ? ?Depression Screen ? ?  09/22/2021  ?  2:06 PM 09/20/2020  ? 10:28 AM 08/15/2017  ?   9:16 AM 07/26/2017  ? 10:33 AM 07/19/2016  ? 10:32 AM  ?PHQ 2/9 Scores  ?PHQ - 2 Score 0 0 0 0 0  ?  ?Fall Risk ? ?  09/22/2021  ?  2:10 PM 09/20/2020  ? 10:28 AM 08/09/2020  ? 12:01 PM 04/13/2019  ?  4:08 PM 08/15/2017

## 2021-09-22 NOTE — Patient Instructions (Signed)
[name] , ?Thank you for taking time to come for your Medicare Wellness Visit. I appreciate your ongoing commitment to your health goals. Please review the following plan we discussed and let me know if I can assist you in the future.  ? ?Screening recommendations/referrals: ?Colonoscopy: Done 02/04/2018 Repeat in 5 years ? ?Recommended yearly ophthalmology/optometry visit for glaucoma screening and checkup ?Recommended yearly dental visit for hygiene and checkup ? ?Vaccinations: ?Influenza vaccine: Due Fall 2023. ?Pneumococcal vaccine: Done 05/11/2013 and 04/30/2014 ?Tdap vaccine: Done 05/08/2012 Repeat in 10 years ? ?Shingles vaccine: Discussed.   ?Covid-19: Done 07/17/2019 and 3/213/2021 ? ?Advanced directives: Please bring a copy of your health care power of attorney and living will to the office to be added to your chart at your convenience. ? ? ?Conditions/risks identified: KEEP UP THE GOOD WORK!! ? ?Next appointment: Follow up in one year for your annual wellness visit. 2024. ? ?Preventive Care 22 Years and Older, Male ? ?Preventive care refers to lifestyle choices and visits with your health care provider that can promote health and wellness. ?What does preventive care include? ?A yearly physical exam. This is also called an annual well check. ?Dental exams once or twice a year. ?Routine eye exams. Ask your health care provider how often you should have your eyes checked. ?Personal lifestyle choices, including: ?Daily care of your teeth and gums. ?Regular physical activity. ?Eating a healthy diet. ?Avoiding tobacco and drug use. ?Limiting alcohol use. ?Practicing safe sex. ?Taking low doses of aspirin every day. ?Taking vitamin and mineral supplements as recommended by your health care provider. ?What happens during an annual well check? ?The services and screenings done by your health care provider during your annual well check will depend on your age, overall health, lifestyle risk factors, and family  history of disease. ?Counseling  ?Your health care provider may ask you questions about your: ?Alcohol use. ?Tobacco use. ?Drug use. ?Emotional well-being. ?Home and relationship well-being. ?Sexual activity. ?Eating habits. ?History of falls. ?Memory and ability to understand (cognition). ?Work and work Statistician. ?Screening  ?You may have the following tests or measurements: ?Height, weight, and BMI. ?Blood pressure. ?Lipid and cholesterol levels. These may be checked every 5 years, or more frequently if you are over 41 years old. ?Skin check. ?Lung cancer screening. You may have this screening every year starting at age 13 if you have a 30-pack-year history of smoking and currently smoke or have quit within the past 15 years. ?Fecal occult blood test (FOBT) of the stool. You may have this test every year starting at age 82. ?Flexible sigmoidoscopy or colonoscopy. You may have a sigmoidoscopy every 5 years or a colonoscopy every 10 years starting at age 62. ?Prostate cancer screening. Recommendations will vary depending on your family history and other risks. ?Hepatitis C blood test. ?Hepatitis B blood test. ?Sexually transmitted disease (STD) testing. ?Diabetes screening. This is done by checking your blood sugar (glucose) after you have not eaten for a while (fasting). You may have this done every 1-3 years. ?Abdominal aortic aneurysm (AAA) screening. You may need this if you are a current or former smoker. ?Osteoporosis. You may be screened starting at age 68 if you are at high risk. ?Talk with your health care provider about your test results, treatment options, and if necessary, the need for more tests. ?Vaccines  ?Your health care provider may recommend certain vaccines, such as: ?Influenza vaccine. This is recommended every year. ?Tetanus, diphtheria, and acellular pertussis (Tdap, Td) vaccine. You may  need a Td booster every 10 years. ?Zoster vaccine. You may need this after age 75. ?Pneumococcal  13-valent conjugate (PCV13) vaccine. One dose is recommended after age 43. ?Pneumococcal polysaccharide (PPSV23) vaccine. One dose is recommended after age 73. ?Talk to your health care provider about which screenings and vaccines you need and how often you need them. ?This information is not intended to replace advice given to you by your health care provider. Make sure you discuss any questions you have with your health care provider. ?Document Released: 06/03/2015 Document Revised: 01/25/2016 Document Reviewed: 03/08/2015 ?Elsevier Interactive Patient Education ? 2017 [place]. ? ?Fall Prevention in the Home ?Falls can cause injuries. They can happen to people of all ages. There are many things you can do to make your home safe and to help prevent falls. ?What can I do on the outside of my home? ?Regularly fix the edges of walkways and driveways and fix any cracks. ?Remove anything that might make you trip as you walk through a door, such as a raised step or threshold. ?Trim any bushes or trees on the path to your home. ?Use bright outdoor lighting. ?Clear any walking paths of anything that might make someone trip, such as rocks or tools. ?Regularly check to see if handrails are loose or broken. Make sure that both sides of any steps have handrails. ?Any raised decks and porches should have guardrails on the edges. ?Have any leaves, snow, or ice cleared regularly. ?Use sand or salt on walking paths during winter. ?Clean up any spills in your garage right away. This includes oil or grease spills. ?What can I do in the bathroom? ?Use night lights. ?Install grab bars by the toilet and in the tub and shower. Do not use towel bars as grab bars. ?Use non-skid mats or decals in the tub or shower. ?If you need to sit down in the shower, use a plastic, non-slip stool. ?Keep the floor dry. Clean up any water that spills on the floor as soon as it happens. ?Remove soap buildup in the tub or shower regularly. ?Attach  bath mats securely with double-sided non-slip rug tape. ?Do not have throw rugs and other things on the floor that can make you trip. ?What can I do in the bedroom? ?Use night lights. ?Make sure that you have a light by your bed that is easy to reach. ?Do not use any sheets or blankets that are too big for your bed. They should not hang down onto the floor. ?Have a firm chair that has side arms. You can use this for support while you get dressed. ?Do not have throw rugs and other things on the floor that can make you trip. ?What can I do in the kitchen? ?Clean up any spills right away. ?Avoid walking on wet floors. ?Keep items that you use a lot in easy-to-reach places. ?If you need to reach something above you, use a strong step stool that has a grab bar. ?Keep electrical cords out of the way. ?Do not use floor polish or wax that makes floors slippery. If you must use wax, use non-skid floor wax. ?Do not have throw rugs and other things on the floor that can make you trip. ?What can I do with my stairs? ?Do not leave any items on the stairs. ?Make sure that there are handrails on both sides of the stairs and use them. Fix handrails that are broken or loose. Make sure that handrails are as long as the stairways. ?  Check any carpeting to make sure that it is firmly attached to the stairs. Fix any carpet that is loose or worn. ?Avoid having throw rugs at the top or bottom of the stairs. If you do have throw rugs, attach them to the floor with carpet tape. ?Make sure that you have a light switch at the top of the stairs and the bottom of the stairs. If you do not have them, ask someone to add them for you. ?What else can I do to help prevent falls? ?Wear shoes that: ?Do not have high heels. ?Have rubber bottoms. ?Are comfortable and fit you well. ?Are closed at the toe. Do not wear sandals. ?If you use a stepladder: ?Make sure that it is fully opened. Do not climb a closed stepladder. ?Make sure that both sides of the  stepladder are locked into place. ?Ask someone to hold it for you, if possible. ?Clearly mark and make sure that you can see: ?Any grab bars or handrails. ?First and last steps. ?Where the edge of each step is. ?U

## 2021-09-27 DIAGNOSIS — Z85820 Personal history of malignant melanoma of skin: Secondary | ICD-10-CM | POA: Diagnosis not present

## 2021-09-27 DIAGNOSIS — L821 Other seborrheic keratosis: Secondary | ICD-10-CM | POA: Diagnosis not present

## 2021-09-27 DIAGNOSIS — L400 Psoriasis vulgaris: Secondary | ICD-10-CM | POA: Diagnosis not present

## 2021-09-27 DIAGNOSIS — L812 Freckles: Secondary | ICD-10-CM | POA: Diagnosis not present

## 2021-09-27 DIAGNOSIS — L738 Other specified follicular disorders: Secondary | ICD-10-CM | POA: Diagnosis not present

## 2021-09-27 DIAGNOSIS — L820 Inflamed seborrheic keratosis: Secondary | ICD-10-CM | POA: Diagnosis not present

## 2021-09-30 ENCOUNTER — Other Ambulatory Visit: Payer: Self-pay | Admitting: Family Medicine

## 2021-10-02 NOTE — Telephone Encounter (Signed)
Requested medication (s) are due for refill today:   Yes ? ?Requested medication (s) are on the active medication list:   Yes ? ?Future visit scheduled:   No ? ? ?Last ordered: 12/29/2020 #90, 2 refills ? ?Returned because labs are overdue.     ? ?Requested Prescriptions  ?Pending Prescriptions Disp Refills  ? losartan (COZAAR) 100 MG tablet [Pharmacy Med Name: LOSARTAN POTASSIUM 100 MG TAB] 90 tablet 2  ?  Sig: TAKE 1 TABLET BY MOUTH EVERY DAY  ?  ? Cardiovascular:  Angiotensin Receptor Blockers Failed - 09/30/2021  1:49 AM  ?  ?  Failed - Cr in normal range and within 180 days  ?  Creat  ?Date Value Ref Range Status  ?01/17/2021 1.20 0.70 - 1.28 mg/dL Final  ?   ?  ?  Failed - K in normal range and within 180 days  ?  Potassium  ?Date Value Ref Range Status  ?01/17/2021 4.2 3.5 - 5.3 mmol/L Final  ?   ?  ?  Failed - Last BP in normal range  ?  BP Readings from Last 1 Encounters:  ?06/20/21 (!) 162/98  ?   ?  ?  Passed - Patient is not pregnant  ?  ?  Passed - Valid encounter within last 6 months  ?  Recent Outpatient Visits   ? ?      ? 3 months ago Chronic cough  ? [place] Pickard, Cammie Mcgee, MD  ? 5 months ago Excessive cerumen in left ear canal  ? A[place], Jessica A, NP  ? 8 months ago Left flank pain, chronic  ? [facility] Pickard, Cammie Mcgee, MD  ? 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 [facility] Pickard, Cammie Mcgee, MD  ? 1 year ago Wound of left cheek, initial encounter  ? [place] Eulogio Bear, NP  ? ?  ?  ? ? ?  ?  ?  ? ?

## 2021-10-12 DIAGNOSIS — M5416 Radiculopathy, lumbar region: Secondary | ICD-10-CM | POA: Diagnosis not present

## 2021-10-14 ENCOUNTER — Other Ambulatory Visit: Payer: Self-pay | Admitting: Family Medicine

## 2021-10-17 NOTE — Telephone Encounter (Signed)
Requested medication (s) are due for refill today: yes  Requested medication (s) are on the active medication list: yes  Last refill:  01/09/21 #90/2  Future visit scheduled: no  Notes to clinic:  Unable to refill per protocol due to failed labs, no updated results.     Requested Prescriptions  Pending Prescriptions Disp Refills   atorvastatin (LIPITOR) 40 MG tablet [Pharmacy Med Name: ATORVASTATIN 40 MG TABLET] 90 tablet 2    Sig: TAKE 1 TABLET BY MOUTH DAILY AT 6 PM     Cardiovascular:  Antilipid - Statins Failed - 10/14/2021  1:25 AM      Failed - Lipid Panel in normal range within the last 12 months    Cholesterol  Date Value Ref Range Status  09/14/2020 124 <200 mg/dL Final   LDL Cholesterol (Calc)  Date Value Ref Range Status  09/14/2020 62 mg/dL (calc) Final    Comment:    Reference range: <100 . Desirable range <100 mg/dL for primary prevention;   <70 mg/dL for patients with CHD or diabetic patients  with > or = 2 CHD risk factors. Marland Kitchen LDL-C is now calculated using the Martin-Hopkins  calculation, which is a validated novel method providing  better accuracy than the Friedewald equation in the  estimation of LDL-C.  Cresenciano Genre et al. Annamaria Helling. 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

## 2021-12-06 IMAGING — CT CT ANGIO NECK
2 of 11 series · 7 of 33 positions shown · IV contrast (omnipaque)
Comparison: Brain MRI same day

CLINICAL DATA: Right eye vision loss

EXAM:
CT ANGIOGRAPHY HEAD AND NECK
TECHNIQUE: Multidetector CT imaging of the head and neck was performed using
the standard protocol during bolus administration of intravenous
contrast. Multiplanar CT image reconstructions and MIPs were
obtained to evaluate the vascular anatomy. Carotid stenosis
measurements (when applicable) are obtained utilizing NASCET
criteria, using the distal internal carotid diameter as the
denominator.
CONTRAST:  100mL OMNIPAQUE IOHEXOL 350 MG/ML SOLN

[Series 11: cta neck/head · axial · 0.52mm/px · z∈[+1149,+1277]mm · 2 of 194 slices shown]
[im 65/194  soft-tissue]
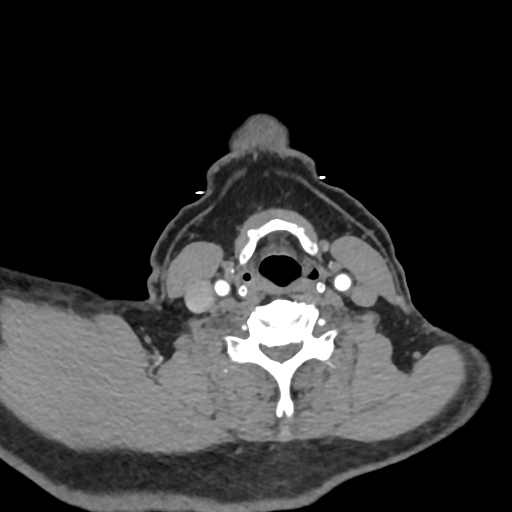
[im 129/194  soft-tissue]
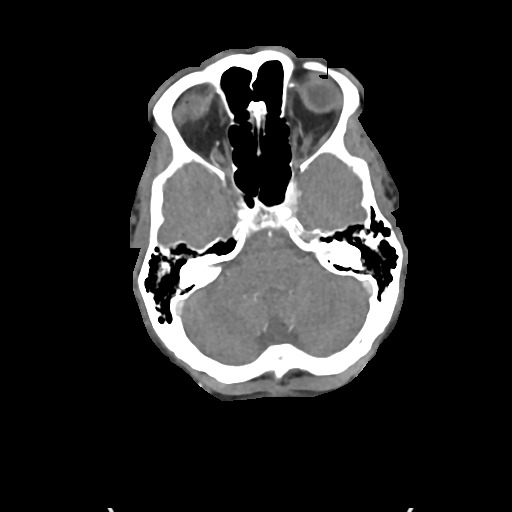

[Series 13: ax thins · axial · 0.39mm/px · z∈[+1086,+1342]mm · 5 of 386 slices shown]
[im 65/386  soft-tissue]
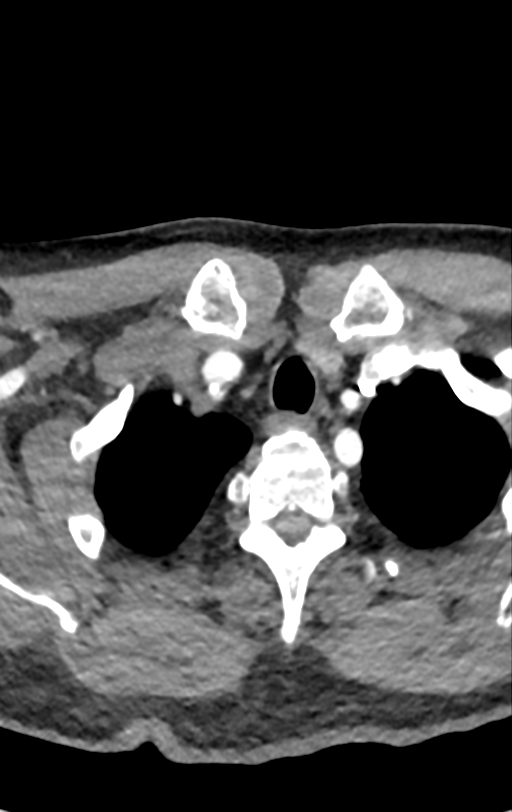
[im 129/386  bone]
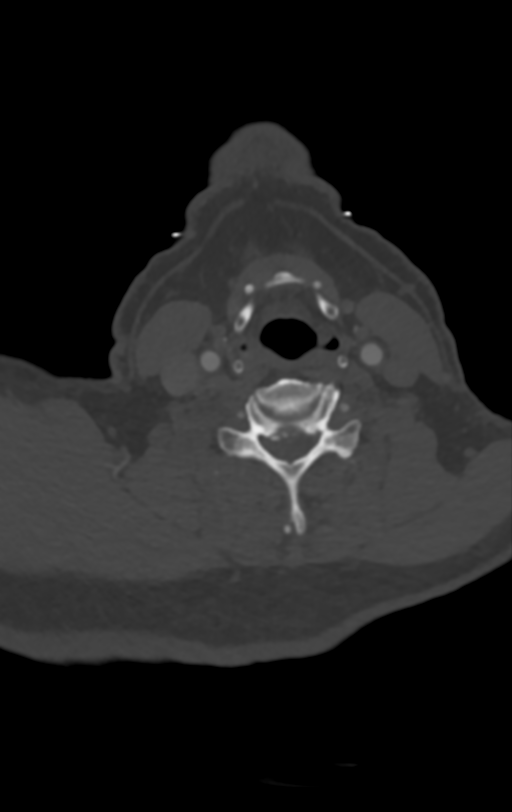
[im 193/386  soft-tissue]
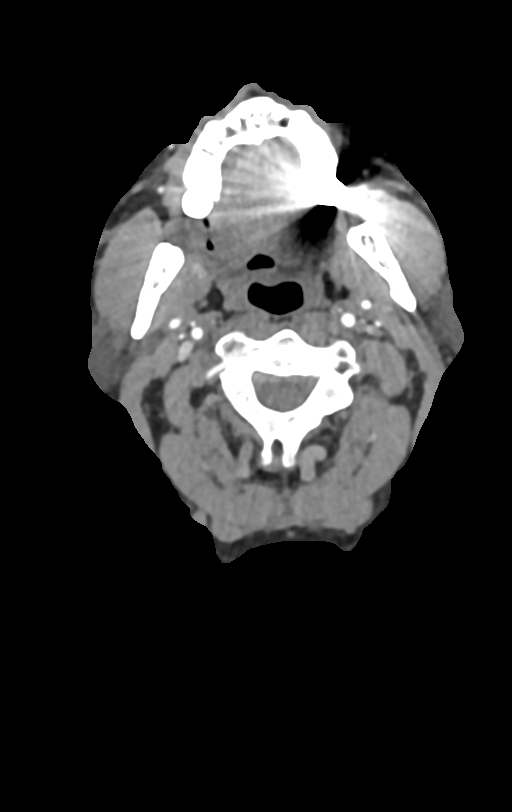
[im 257/386  bone]
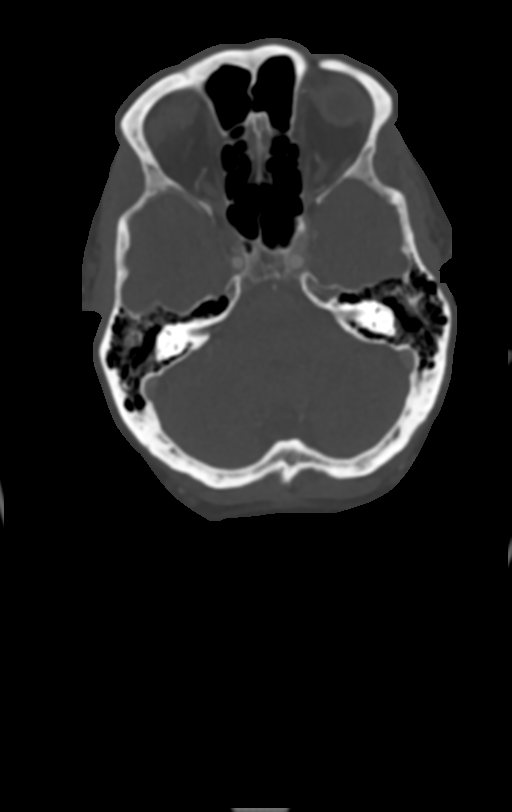
[im 321/386  soft-tissue]
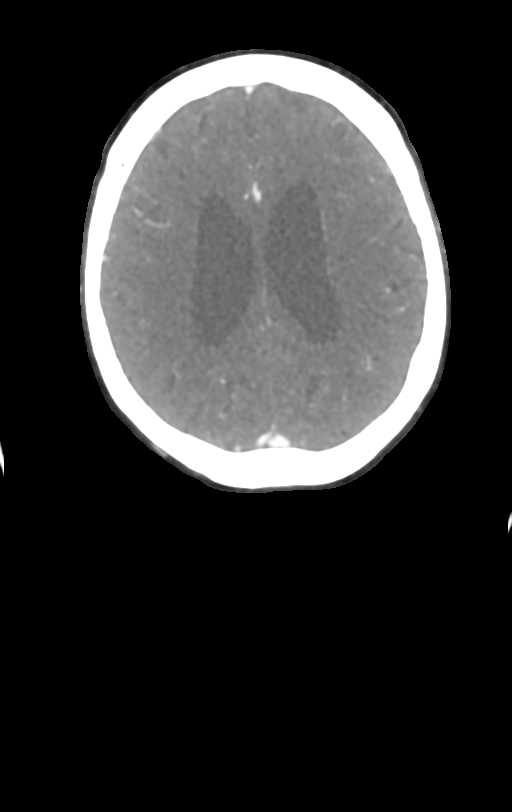

[7 of 33 positions shown; findings below may reference images not displayed]

FINDINGS: CT HEAD FINDINGS

Brain: There is no mass, hemorrhage or extra-axial collection. There
is generalized atrophy without lobar predilection. There is no acute
or chronic infarction. There is hypoattenuation of the
periventricular white matter, most commonly indicating chronic
ischemic microangiopathy.

Skull: The visualized skull base, calvarium and extracranial soft
tissues are normal.

Sinuses/Orbits: No fluid levels or advanced mucosal thickening of
the visualized paranasal sinuses. No mastoid or middle ear effusion.
The orbits are normal.

CTA NECK FINDINGS

SKELETON: There is no bony spinal canal stenosis. No lytic or
blastic lesion.

OTHER NECK: Normal pharynx, larynx and major salivary glands. No
cervical lymphadenopathy. Unremarkable thyroid gland.

UPPER CHEST: No pneumothorax or pleural effusion. No nodules or
masses.

AORTIC ARCH:

There is mild calcific atherosclerosis of the aortic arch. There is
no aneurysm, dissection or hemodynamically significant stenosis of
the visualized portion of the aorta. Conventional 3 vessel aortic
branching pattern. The visualized proximal subclavian arteries are
widely patent.

RIGHT CAROTID SYSTEM: No dissection, occlusion or aneurysm. Mild
atherosclerotic calcification at the carotid bifurcation without
hemodynamically significant stenosis.

LEFT CAROTID SYSTEM: No dissection, occlusion or aneurysm. Mild
atherosclerotic calcification at the carotid bifurcation without
hemodynamically significant stenosis.

VERTEBRAL ARTERIES: Left dominant configuration. Both origins are
clearly patent. There is no dissection, occlusion or flow-limiting
stenosis to the skull base (V1-V3 segments).

CTA HEAD FINDINGS

POSTERIOR CIRCULATION:

--Vertebral arteries: Normal V4 segments.

--Posterior inferior cerebellar arteries (PICA): Patent origins from
the vertebral arteries.

--Anterior inferior cerebellar arteries (AICA): Patent origins from
the basilar artery.

--Basilar artery: Normal.

--Superior cerebellar arteries: Normal.

--Posterior cerebral arteries: Normal. Both are predominantly
supplied by the posterior communicating arteries (p-comm).

ANTERIOR CIRCULATION:

--Intracranial internal carotid arteries: Normal.

--Anterior cerebral arteries (ACA): Normal. Both A1 segments are
present. Patent anterior communicating artery (a-comm).

--Middle cerebral arteries (MCA): Normal.

VENOUS SINUSES: As permitted by contrast timing, patent.

ANATOMIC VARIANTS: None

Review of the MIP images confirms the above findings.
IMPRESSION: 1. No intracranial arterial occlusion or high-grade stenosis.
2. Mild bilateral carotid bifurcation atherosclerosis and aortic
atherosclerosis ([id number]).

## 2021-12-06 IMAGING — MR MR HEAD W/O CM
12 of 13 series · 44 of 48 positions shown · non-contrast
Comparison: None.

CLINICAL DATA: Vision loss. Suspected right central retinal artery
occlusion. Right eye vision loss for 3-4 days.

EXAM:
MRI HEAD WITHOUT CONTRAST
TECHNIQUE: Multiplanar, multiecho pulse sequences of the brain and surrounding
structures were obtained without intravenous contrast.

[Series 5: DWI · axial · 3.0mm · 0.88mm/px · z∈[-77,+76]mm · 9 of 104 slices shown (1 of 4)]
[im 1/104]
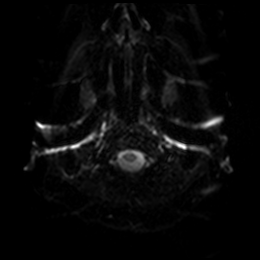
[im 13/104]
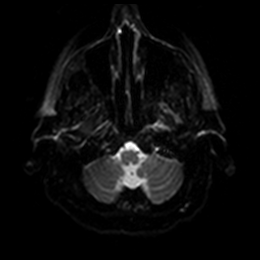
[im 26/104]
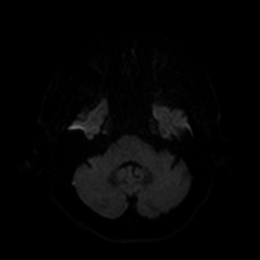
[im 39/104]
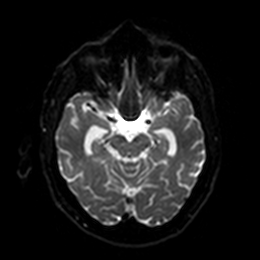
[im 52/104]
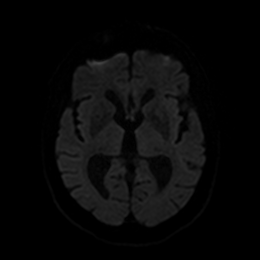
[im 65/104]
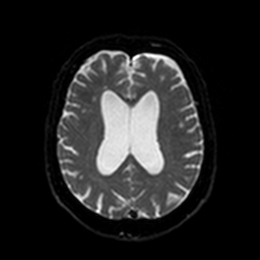
[im 78/104]
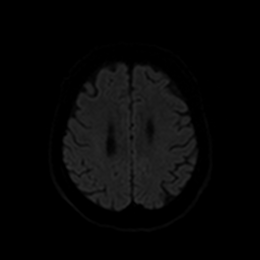
[im 91/104]
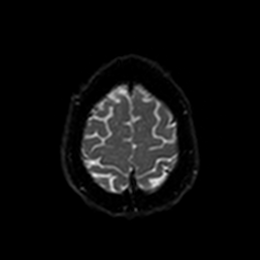
[im 104/104]
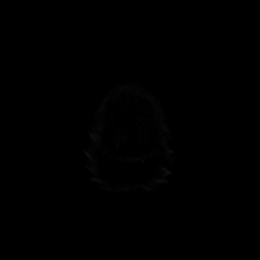

[Series 6: DWI · axial · 3.0mm · 0.88mm/px · z∈[-77,+76]mm · 4 of 52 slices shown (2 of 4)]
[im 1/52]
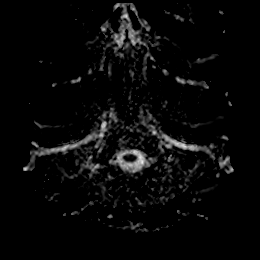
[im 18/52]
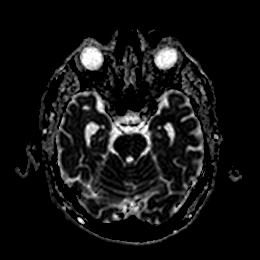
[im 35/52]
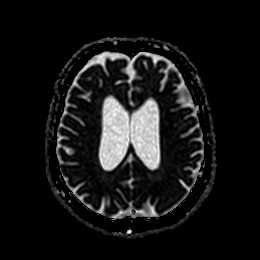
[im 52/52]
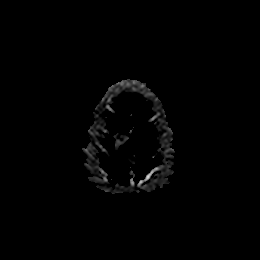

[Series 7: DWI · coronal · 4.0mm · 0.88mm/px · 5 of 72 slices shown (3 of 4)]
[im 1/72]
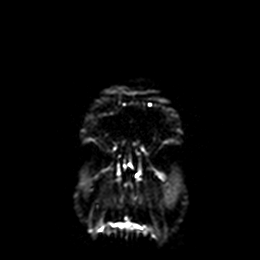
[im 18/72]
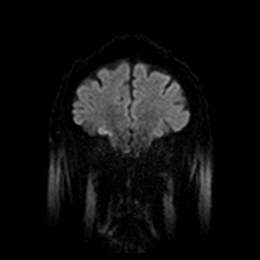
[im 36/72]
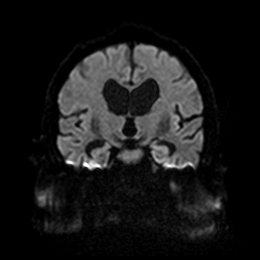
[im 54/72]
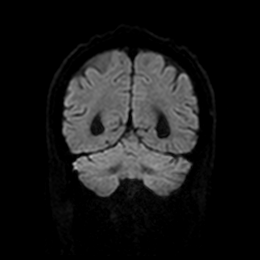
[im 72/72]
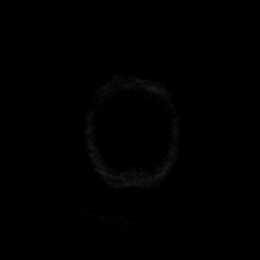

[Series 8: DWI · coronal · 4.0mm · 0.88mm/px · 3 of 36 slices shown (4 of 4)]
[im 1/36]
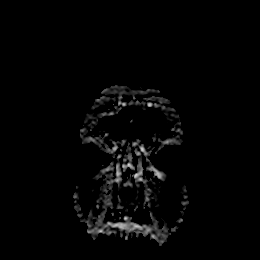
[im 18/36]
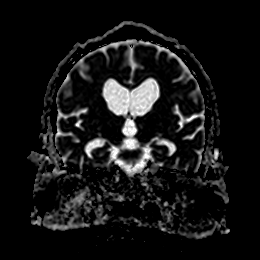
[im 36/36]
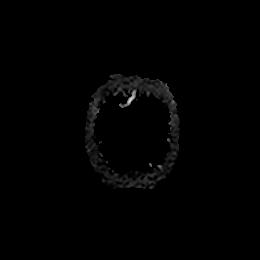

[Series 9: T1 · sagittal · 5.0mm · 0.78mm/px · 1 of 20 slices shown]
[im 1/20]
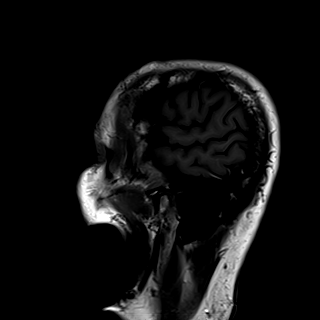

[Series 10: T2 · axial · 5.0mm · 0.72mm/px · z∈[-76,+68]mm · 2 of 25 slices shown (1 of 2)]
[im 1/25]
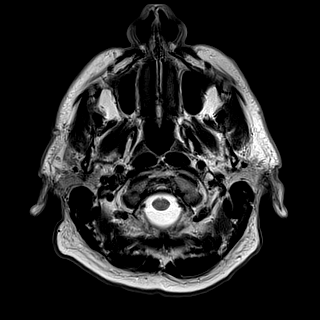
[im 25/25]
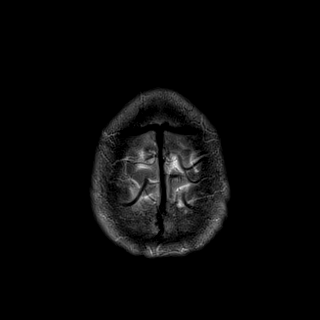

[Series 11: FLAIR · axial · 5.0mm · 0.45mm/px · z∈[-76,+68]mm · 2 of 25 slices shown]
[im 1/25]
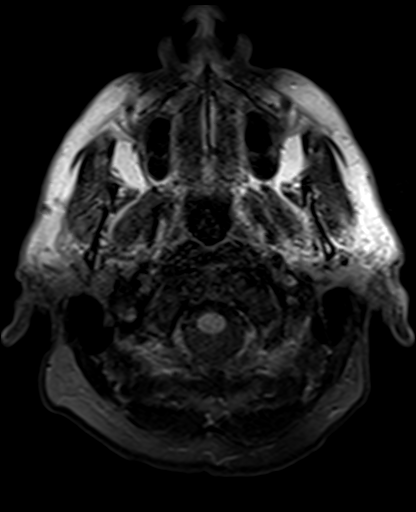
[im 25/25]
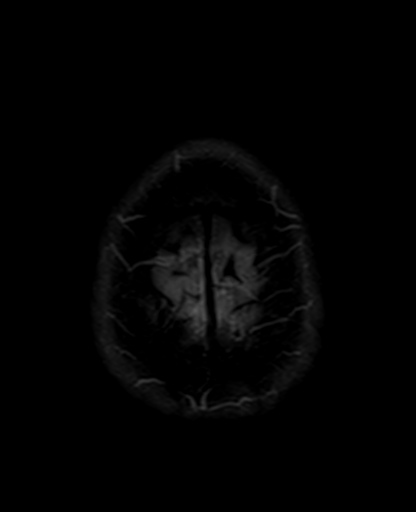

[Series 12: mag_images · axial · 3.0mm · 0.90mm/px · z∈[-85,+92]mm · 4 of 60 slices shown]
[im 1/60]
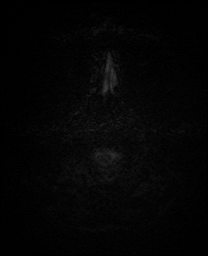
[im 20/60]
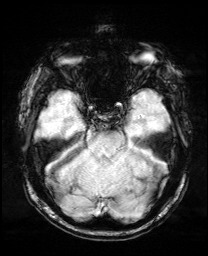
[im 40/60]
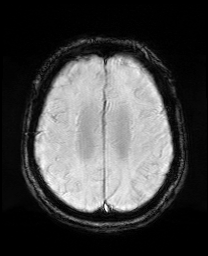
[im 60/60]
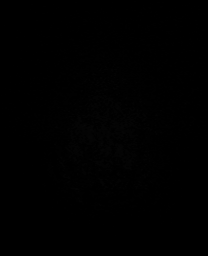

[Series 13: pha_images · axial · 3.0mm · 0.90mm/px · z∈[-85,+86]mm · 4 of 58 slices shown]
[im 1/58]
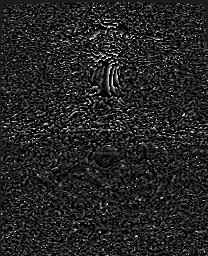
[im 20/58]
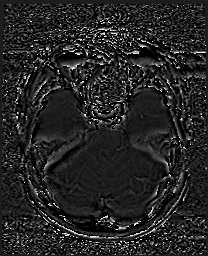
[im 39/58]
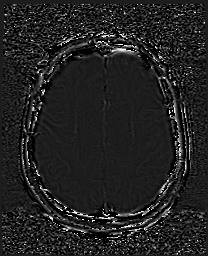
[im 58/58]
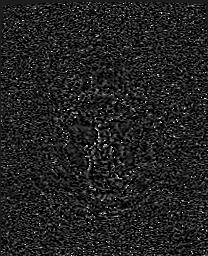

[Series 14: swi_images · axial · 3.0mm · 0.90mm/px · z∈[-85,+92]mm · 4 of 60 slices shown]
[im 1/60]
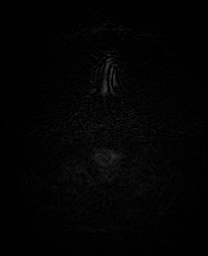
[im 20/60]
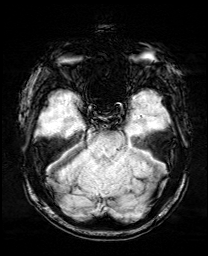
[im 40/60]
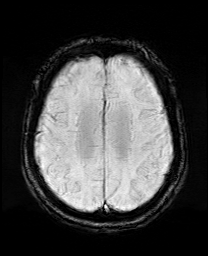
[im 60/60]
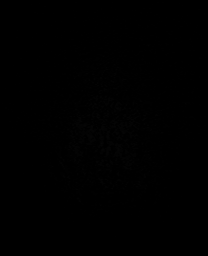

[Series 15: mip_images(sw) · axial · 24.0mm · 0.90mm/px · z∈[-75,+81]mm · 4 of 53 slices shown]
[im 1/53]
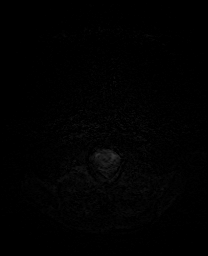
[im 18/53]
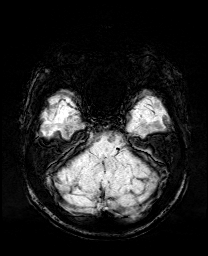
[im 35/53]
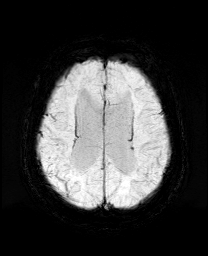
[im 53/53]
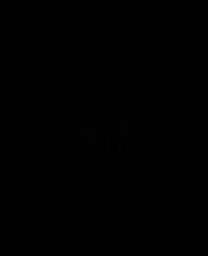

[Series 17: T2 · coronal · 5.0mm · 0.34mm/px · 2 of 29 slices shown (2 of 2)]
[im 1/29]
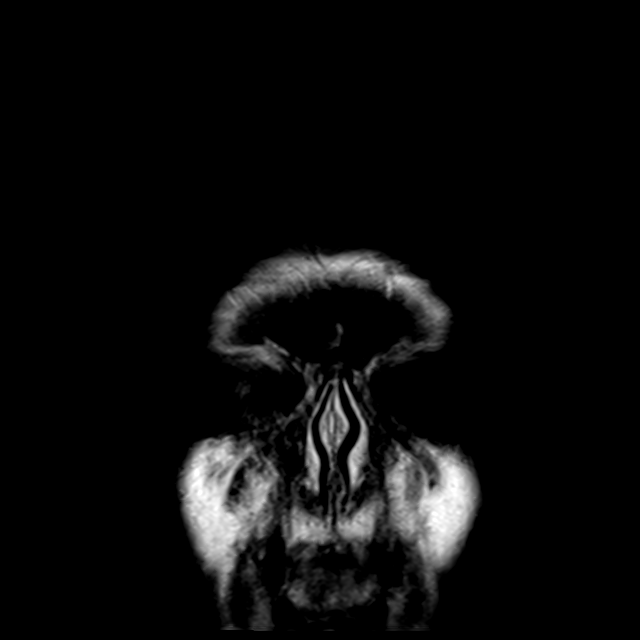
[im 29/29]
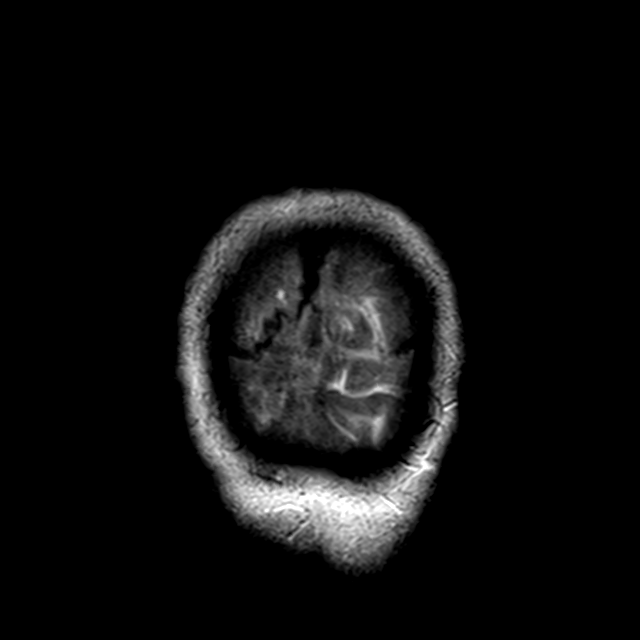

[44 of 48 positions shown; findings below may reference images not displayed]

FINDINGS: BRAIN: There is no acute infarct, acute hemorrhage or extra-axial
collection. Mild white matter hyperintensity, most commonly due to
chronic ischemic microangiopathy, though not unexpected for age.
There is generalized atrophy without lobar predilection. The midline
structures are normal.

VASCULAR: The major intracranial arterial and venous sinus flow
voids are normal. Susceptibility-sensitive sequences show no chronic
microhemorrhage or superficial siderosis.

SKULL AND UPPER CERVICAL SPINE: Calvarial bone marrow signal is
normal. There is no skull base mass. The visualized upper cervical
spine and soft tissues are normal.

SINUSES/ORBITS: There are no fluid levels or advanced mucosal
thickening. The mastoid air cells and middle ear cavities are free
of fluid. The orbits are normal.
IMPRESSION: 1. No acute intracranial abnormality.
2. Generalized atrophy without lobar predilection.

## 2021-12-08 ENCOUNTER — Other Ambulatory Visit: Payer: Self-pay | Admitting: Family Medicine

## 2021-12-11 NOTE — Telephone Encounter (Signed)
Requested medication (s) are due for refill today - expired Rx  Requested medication (s) are on the active medication list -yes  Future visit scheduled -yes  Last refill: 11/22/20 #180 3RF  Notes to clinic: Expired Rx, fails lab protocol  Requested Prescriptions  Pending Prescriptions Disp Refills   allopurinol (ZYLOPRIM) 100 MG tablet [Pharmacy Med Name: ALLOPURINOL 100 MG TABLET] 180 tablet 3    Sig: TAKE 2 [place]     Endocrinology:  Gout Agents - allopurinol Failed - 12/08/2021  6:51 PM      Failed - Uric Acid in normal range and within 360 days    No results found for: "POCURA", "LABURIC"       Passed - Cr in normal range and within 360 days    Creat  Date Value Ref Range Status  01/17/2021 1.20 0.70 - 1.28 mg/dL Final         Passed - Valid encounter within last 12 months    Recent Outpatient Visits           5 months ago Chronic cough   [place] Susy Frizzle, MD   7 months ago Excessive cerumen in left ear canal   [place] Eulogio Bear, NP   10 months ago Left flank pain, chronic   [facility] Schaumann, Cammie Mcgee, MD   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)   [place] Susy Frizzle, MD   1 year ago Wound of left cheek, initial encounter   [place] Eulogio Bear, NP              Passed - CBC within normal limits and completed in the last 12 months    WBC  Date Value Ref Range Status  01/17/2021 6.2 3.8 - 10.8 Thousand/uL Final   RBC  Date Value Ref Range Status  01/17/2021 4.33 4.20 - 5.80 Million/uL Final   Hemoglobin  Date Value Ref Range Status  01/17/2021 14.3 13.2 - 17.1 g/dL Final   HCT  Date Value Ref Range Status  01/17/2021 42.5 38.5 - 50.0 % Final   MCHC  Date Value Ref Range Status  01/17/2021 33.6 32.0 - 36.0 g/dL Final   [facility]  Date Value Ref Range Status  01/17/2021 33.0 27.0 - 33.0 pg Final   MCV   Date Value Ref Range Status  01/17/2021 98.2 80.0 - 100.0 fL Final   No results found for: "PLTCOUNTKUC", "LABPLAT", "POCPLA" RDW  Date Value Ref Range Status  01/17/2021 12.7 11.0 - 15.0 % Final            Requested Prescriptions  Pending Prescriptions Disp Refills   allopurinol (ZYLOPRIM) 100 MG tablet [Pharmacy Med Name: ALLOPURINOL 100 MG TABLET] 180 tablet 3    Sig: TAKE 2 TABLETS BY [place]     Endocrinology:  Gout Agents - allopurinol Failed - 12/08/2021  6:51 PM      Failed - Uric Acid in normal range and within 360 days    No results found for: "POCURA", "LABURIC"       Passed - Cr in normal range and within 360 days    Creat  Date Value Ref Range Status  01/17/2021 1.20 0.70 - 1.28 mg/dL Final         Passed - Valid encounter within last 12 months    Recent Outpatient Visits  5 months ago Chronic cough   [place] Dennard Schaumann, Cammie Mcgee, MD   7 months ago Excessive cerumen in left ear canal   [place] Eulogio Bear, NP   10 months ago Left flank pain, chronic   [place] [facility] Dennard Schaumann, Cammie Mcgee, MD   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[facility])   [place] Susy Frizzle, MD   1 year ago Wound of left cheek, initial encounter   [place] Eulogio Bear, NP              Passed - CBC within normal limits and completed in the last 12 months    WBC  Date Value Ref Range Status  01/17/2021 6.2 3.8 - 10.8 Thousand/uL Final   RBC  Date Value Ref Range Status  01/17/2021 4.33 4.20 - 5.80 Million/uL Final   Hemoglobin  Date Value Ref Range Status  01/17/2021 14.3 13.2 - 17.1 g/dL Final   HCT  Date Value Ref Range Status  01/17/2021 42.5 38.5 - 50.0 % Final   MCHC  Date Value Ref Range Status  01/17/2021 33.6 32.0 - 36.0 g/dL Final   [facility]  Date Value Ref Range Status  01/17/2021 33.0 27.0 - 33.0 pg Final   MCV  Date Value Ref Range  Status  01/17/2021 98.2 80.0 - 100.0 fL Final   No results found for: "PLTCOUNTKUC", "LABPLAT", "POCPLA" RDW  Date Value Ref Range Status  01/17/2021 12.7 11.0 - 15.0 % Final

## 2021-12-14 ENCOUNTER — Other Ambulatory Visit: Payer: Self-pay | Admitting: Family Medicine

## 2021-12-15 NOTE — Telephone Encounter (Signed)
Called pt - LMOMTCB for appt.  

## 2021-12-15 NOTE — Telephone Encounter (Signed)
Requested medications are due for refill today.  yes  Requested medications are on the active medications list.  yes  Last refill. 11/22/2020 #180 3 refills  Future visit scheduled.   no  Notes to clinic.  Pt is due for OV    Requested Prescriptions  Pending Prescriptions Disp Refills   allopurinol (ZYLOPRIM) 100 MG tablet [Pharmacy Med Name: ALLOPURINOL 100 MG TABLET] 180 tablet 3    Sig: TAKE 2 TABLETS BY [facility] DAY     Endocrinology:  Gout Agents - allopurinol Failed - 12/14/2021  2:28 PM      Failed - Uric Acid in normal range and within 360 days    No results found for: "POCURA", "LABURIC"       Passed - Cr in normal range and within 360 days    Creat  Date Value Ref Range Status  01/17/2021 1.20 0.70 - 1.28 mg/dL Final         Passed - Valid encounter within last 12 months    Recent Outpatient Visits           5 months ago Chronic cough   [place] Dennard Schaumann, Cammie Mcgee, MD   7 months ago Excessive cerumen in left ear canal   [place] Eulogio Bear, NP   11 months ago Left flank pain, chronic   [facility] Dennard Schaumann, Cammie Mcgee, MD   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[facility])   [place] Susy Frizzle, MD   1 year ago Wound of left cheek, initial encounter   [place] Eulogio Bear, NP              Passed - CBC within normal limits and completed in the last 12 months    WBC  Date Value Ref Range Status  01/17/2021 6.2 3.8 - 10.8 Thousand/uL Final   RBC  Date Value Ref Range Status  01/17/2021 4.33 4.20 - 5.80 Million/uL Final   Hemoglobin  Date Value Ref Range Status  01/17/2021 14.3 13.2 - 17.1 g/dL Final   HCT  Date Value Ref Range Status  01/17/2021 42.5 38.5 - 50.0 % Final   MCHC  Date Value Ref Range Status  01/17/2021 33.6 32.0 - 36.0 g/dL Final   [facility]  Date Value Ref Range Status  01/17/2021 33.0 27.0 - 33.0 pg Final   MCV  Date Value  Ref Range Status  01/17/2021 98.2 80.0 - 100.0 fL Final   No results found for: "PLTCOUNTKUC", "LABPLAT", "POCPLA" RDW  Date Value Ref Range Status  01/17/2021 12.7 11.0 - 15.0 % Final

## 2021-12-28 ENCOUNTER — Ambulatory Visit (INDEPENDENT_AMBULATORY_CARE_PROVIDER_SITE_OTHER): Payer: Medicare Other | Admitting: Family Medicine

## 2021-12-28 VITALS — BP 150/76 | HR 82 | Temp 98.4°F | Ht 71.0 in | Wt 221.0 lb

## 2021-12-28 DIAGNOSIS — N400 Enlarged prostate without lower urinary tract symptoms: Secondary | ICD-10-CM | POA: Diagnosis not present

## 2021-12-28 DIAGNOSIS — Z125 Encounter for screening for malignant neoplasm of prostate: Secondary | ICD-10-CM | POA: Diagnosis not present

## 2021-12-28 DIAGNOSIS — I639 Cerebral infarction, unspecified: Secondary | ICD-10-CM

## 2021-12-28 DIAGNOSIS — I10 Essential (primary) hypertension: Secondary | ICD-10-CM | POA: Diagnosis not present

## 2021-12-28 DIAGNOSIS — M109 Gout, unspecified: Secondary | ICD-10-CM

## 2021-12-28 DIAGNOSIS — E7800 Pure hypercholesterolemia, unspecified: Secondary | ICD-10-CM

## 2021-12-28 MED ORDER — ALLOPURINOL 100 MG PO TABS: 200.0000 mg | ORAL_TABLET | Freq: Every day | ORAL | 3 refills | Status: DC

## 2021-12-28 NOTE — Progress Notes (Signed)
Subjective:    Patient ID: Chad Serrano, male    DOB: 15-Feb-1946, 76 y.o.   MRN: [id number]  Patient is a very pleasant 76 year old Caucasian gentleman who has a history of cryptogenic stroke, hypertension, and hyperlipidemia as well as gout.  He is due for fasting lab work.  Overall he is doing well.  The only concern he has is a chronic cough.  I have seen him for this in the past and treating with Xyzal, Flonase, and a proton pump inhibitor.  He has no history of asthma.  He states the cough previously just went away gradually.  Now the cough seems to be every time he eats particularly breakfast and supper.  He denies any fevers or chills or hemoptysis.  He denies any wheezing.  He does have some postnasal drip.  He denies any acid reflux.  Otherwise he is doing fine. Past Medical History:  Diagnosis Date   Anxiety    Chronic kidney disease    Gout    Hyperlipidemia    Hypertension    Hypogonadism male    Hypogonadism male    Melanoma ([place])    at T4 surgically excised at [facility] (2020) free margins and sentinel node biopsy negative   Melanoma ([place])    Stroke ([facility])    cryptogenic, right retinal artery occlusion   Past Surgical History:  Procedure Laterality Date   APPENDECTOMY     CHOLECYSTECTOMY  06/14/2020   COLONOSCOPY     GALLBLADDER SURGERY  05/2020   ROTATOR CUFF REPAIR Right    Current Outpatient Medications on File Prior to Visit  Medication Sig Dispense Refill   allopurinol (ZYLOPRIM) 100 MG tablet TAKE 2 TABLETS BY MOUTH EVERY DAY 180 tablet 3   Ascorbic Acid (VITAMIN C) 1000 MG tablet Take 1,000 mg by mouth daily.     atorvastatin (LIPITOR) 40 MG tablet TAKE 1 TABLET BY MOUTH DAILY AT 6 PM 90 tablet 2   betamethasone dipropionate 0.05 % cream Apply topically 2 (two) times daily.     Cholecalciferol (VITAMIN D3) 2000 UNITS TABS Take 2,000 Units by mouth daily.      clopidogrel (PLAVIX) 75 MG tablet TAKE 1 TABLET BY MOUTH EVERY DAY 90 tablet 3   desoximetasone  (TOPICORT) 0.25 % cream APPLY SPARINGLY TO AFFECTED AREA TWICE A DAY     L-Arginine 500 MG TABS Take 500 mg by mouth daily.      losartan (COZAAR) 100 MG tablet TAKE 1 TABLET BY MOUTH EVERY DAY 90 tablet 2   Magnesium 500 MG CAPS Take 500 mg by mouth daily.      Omega-3 Fatty Acids (FISH OIL) 1000 MG CAPS Take 2,000 mg by mouth daily.      pantoprazole (PROTONIX) 40 MG tablet TAKE 1 TABLET BY MOUTH EVERY DAY 90 tablet 1   tiZANidine (ZANAFLEX) 4 MG tablet Take 1 tablet (4 mg total) by mouth every 6 (six) hours as needed for muscle spasms. 30 tablet 0   traMADol (ULTRAM) 50 MG tablet Take 50 mg by mouth every 12 (twelve) hours as needed for moderate pain.     vitamin B-12 (CYANOCOBALAMIN) 1000 MCG tablet Take 1,000 mcg by mouth daily.      Zinc 50 MG TABS Take 50 mg by mouth daily.      No current facility-administered medications on file prior to visit.   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  Other Topics Concern   Not on file  Social History Narrative   Not on file   Social Determinants of Health   Financial Resource Strain: Low Risk  (09/22/2021)   Overall Financial Resource Strain (CARDIA)    Difficulty of Paying Living Expenses: Not hard at all  Food Insecurity: No Food Insecurity (09/22/2021)   Hunger Vital Sign    Worried About Running Out of Food in the Last Year: Never true    Ran Out of Food in the Last Year: Never true  Transportation Needs: No Transportation Needs (09/22/2021)   PRAPARE - Armed forces logistics/support/administrative officer (Medical): No    Lack of Transportation (Non-Medical): No  Physical Activity: Sufficiently Active (09/22/2021)   Exercise Vital Sign    Days of Exercise per Week: 3 days    Minutes of Exercise per Session: 90 min  Stress: No Stress Concern Present (09/22/2021)   [place]    Feeling of Stress : Not at all  Social Connections: [place] (09/22/2021)   Social Connection and Isolation Panel [NHANES]    Frequency of Communication with Friends and Family: More than three times a week    Frequency of Social Gatherings with Friends and Family: More than three times a week    Attends Religious Services: More than 4 times per year    Active Member of [employer] or Organizations: Yes    Attends Music therapist: More than 4 times per year    Marital Status: Married  Human resources officer Violence: Not At Risk (09/22/2021)   Humiliation, Afraid, Rape, and Kick questionnaire    Fear of Current or Ex-Partner: No    Emotionally Abused: No    Physically Abused: No    Sexually Abused: No   Family History  Problem Relation Age of Onset   Colon polyps Father    Cancer Mother    Colon cancer Neg Hx    Esophageal cancer Neg Hx    Rectal cancer Neg Hx    Stomach cancer Neg Hx       Review of Systems  All other systems reviewed and are negative.      Objective:   Physical Exam Vitals reviewed.  Constitutional:      General: He is not in acute distress.    Appearance: Normal appearance. He is not ill-appearing, toxic-appearing or diaphoretic.  HENT:     Head: Normocephalic and atraumatic.     Right Ear: Tympanic membrane and ear canal normal. There is no impacted cerumen.     Left Ear: Tympanic membrane and ear canal normal. There is no impacted cerumen.     Nose: No congestion or rhinorrhea.     Mouth/Throat:     Mouth: Mucous membranes are moist.     Pharynx: Oropharynx is clear. No  oropharyngeal exudate or posterior oropharyngeal erythema.  Eyes:     General: No scleral icterus.       Right  eye: No discharge.        Left eye: No discharge.     Extraocular Movements: Extraocular movements intact.     Conjunctiva/sclera: Conjunctivae normal.     Pupils: Pupils are equal, round, and reactive to light.  Neck:     Vascular: No carotid bruit.  Cardiovascular:     Rate and Rhythm: Normal rate and regular rhythm.     Heart sounds: No murmur heard.    No friction rub. No gallop.  Pulmonary:     Effort: Pulmonary effort is normal. No respiratory distress.     Breath sounds: Normal breath sounds. No stridor. No wheezing, rhonchi or rales.  Chest:     Chest wall: No tenderness.  Abdominal:     General: Abdomen is flat. Bowel sounds are normal. There is no distension.     Palpations: Abdomen is soft.     Tenderness: There is no abdominal tenderness. There is no right CVA tenderness, left CVA tenderness, guarding or rebound.     Hernia: No hernia is present.  Musculoskeletal:     Cervical back: Normal range of motion and neck supple. No rigidity or tenderness.     Right lower leg: No edema.     Left lower leg: No edema.  Lymphadenopathy:     Cervical: No cervical adenopathy.  Skin:    General: Skin is warm.     Coloration: Skin is not jaundiced or pale.     Findings: No bruising, erythema, lesion or rash.  Neurological:     General: No focal deficit present.     Mental Status: He is alert and oriented to person, place, and time. Mental status is at baseline.     Cranial Nerves: No cranial nerve deficit.     Sensory: No sensory deficit.     Motor: No weakness.     Coordination: Coordination normal.     Gait: Gait normal.     Deep Tendon Reflexes: Reflexes normal.  Psychiatric:        Mood and Affect: Mood normal.        Behavior: Behavior normal.        Thought Content: Thought content normal.        Judgment: Judgment normal.       Assessment & Plan:   Cryptogenic stroke ([place]) - Plan: CBC with Differential/Platelet, Lipid panel, COMPLETE METABOLIC PANEL WITH GFR  Essential hypertension - Plan: CBC with Differential/Platelet, Lipid panel, COMPLETE METABOLIC PANEL WITH GFR  Pure hypercholesterolemia - Plan: CBC with Differential/Platelet, Lipid panel, COMPLETE METABOLIC PANEL WITH GFR  Gout, unspecified cause, unspecified chronicity, unspecified site - Plan: Uric acid  Prostate cancer screening - Plan: PSA  Benign prostatic hyperplasia without lower urinary tract symptoms - Plan: PSA Patient's blood pressure today is elevated however he is checking it frequently at home.  At home his blood pressures typically 110-130/70-80.  He will continue to monitor this.  I would like him to return fasting at his earliest convenience for a CBC, CMP, and a lipid panel.  Given his history of stroke I would like his LDL cholesterol to be below 70.  Return fasting to check a uric acid level.  Ideally uric acid level is less than 6.  I would also like to screen for prostate cancer with a PSA.  He will continue Flonase and Xyzal for postnasal drip however have asked him to resume Protonix for possible laryngoesophageal reflux

## 2022-01-16 DIAGNOSIS — M5416 Radiculopathy, lumbar region: Secondary | ICD-10-CM | POA: Diagnosis not present

## 2022-02-09 ENCOUNTER — Other Ambulatory Visit: Payer: Self-pay

## 2022-02-09 ENCOUNTER — Other Ambulatory Visit: Payer: Medicare Other

## 2022-02-09 DIAGNOSIS — E7800 Pure hypercholesterolemia, unspecified: Secondary | ICD-10-CM | POA: Diagnosis not present

## 2022-02-09 DIAGNOSIS — E291 Testicular hypofunction: Secondary | ICD-10-CM | POA: Diagnosis not present

## 2022-02-09 DIAGNOSIS — I639 Cerebral infarction, unspecified: Secondary | ICD-10-CM

## 2022-02-09 DIAGNOSIS — R972 Elevated prostate specific antigen [PSA]: Secondary | ICD-10-CM

## 2022-02-09 DIAGNOSIS — I10 Essential (primary) hypertension: Secondary | ICD-10-CM

## 2022-02-10 LAB — LIPID PANEL
Cholesterol: 141 mg/dL (ref <200)
HDL: 54 mg/dL (ref >=40)
LDL Cholesterol (Calc): 71 mg/dL (calc)
Non-HDL Cholesterol (Calc): 87 mg/dL (calc) (ref <130)
Total CHOL/HDL Ratio: 2.6 (calc) (ref <5.0)
Triglycerides: 77 mg/dL (ref <150)

## 2022-02-10 LAB — CBC WITH DIFFERENTIAL/PLATELET
Absolute Monocytes: 594 cells/uL (ref 200–950)
Basophils Absolute: 50 cells/uL (ref 0–200)
Basophils Relative: 0.9 %
Eosinophils Absolute: 101 cells/uL (ref 15–500)
Eosinophils Relative: 1.8 %
HCT: 42.3 % (ref 38.5–50.0)
Hemoglobin: 14.4 g/dL (ref 13.2–17.1)
Lymphs Abs: 3024 cells/uL (ref 850–3900)
MCH: 33.3 pg — ABNORMAL HIGH (ref 27.0–33.0)
MCHC: 34 g/dL (ref 32.0–36.0)
MCV: 97.7 fL (ref 80.0–100.0)
MPV: 10.8 fL (ref 7.5–12.5)
Monocytes Relative: 10.6 %
Neutro Abs: 1831 cells/uL (ref 1500–7800)
Neutrophils Relative %: 32.7 %
Platelets: 133 10*3/uL — ABNORMAL LOW (ref 140–400)
RBC: 4.33 10*6/uL (ref 4.20–5.80)
RDW: 12.3 % (ref 11.0–15.0)
Total Lymphocyte: 54 %
WBC: 5.6 10*3/uL (ref 3.8–10.8)

## 2022-02-10 LAB — COMPREHENSIVE METABOLIC PANEL
AG Ratio: 1.7 (calc) (ref 1.0–2.5)
ALT: 20 U/L (ref 9–46)
AST: 24 U/L (ref 10–35)
Albumin: 3.6 g/dL (ref 3.6–5.1)
Alkaline phosphatase (APISO): 63 U/L (ref 35–144)
BUN: 15 mg/dL (ref 7–25)
CO2: 26 mmol/L (ref 20–32)
Calcium: 8.6 mg/dL (ref 8.6–10.3)
Chloride: 107 mmol/L (ref 98–110)
Creat: 1.09 mg/dL (ref 0.70–1.28)
Globulin: 2.1 g/dL (calc) (ref 1.9–3.7)
Glucose, Bld: 89 mg/dL (ref 65–99)
Potassium: 4.2 mmol/L (ref 3.5–5.3)
Sodium: 142 mmol/L (ref 135–146)
Total Bilirubin: 1.2 mg/dL (ref 0.2–1.2)
Total Protein: 5.7 g/dL — ABNORMAL LOW (ref 6.1–8.1)

## 2022-02-10 LAB — PSA: PSA: 0.65 ng/mL (ref <=4.00)

## 2022-02-12 ENCOUNTER — Ambulatory Visit (INDEPENDENT_AMBULATORY_CARE_PROVIDER_SITE_OTHER): Payer: Medicare Other | Admitting: Family Medicine

## 2022-02-12 VITALS — BP 138/82 | HR 85 | Temp 98.4°F | Ht 71.0 in | Wt 217.2 lb

## 2022-02-12 DIAGNOSIS — Z23 Encounter for immunization: Secondary | ICD-10-CM

## 2022-02-12 DIAGNOSIS — Z0000 Encounter for general adult medical examination without abnormal findings: Secondary | ICD-10-CM

## 2022-02-12 DIAGNOSIS — R053 Chronic cough: Secondary | ICD-10-CM

## 2022-02-12 DIAGNOSIS — I639 Cerebral infarction, unspecified: Secondary | ICD-10-CM | POA: Diagnosis not present

## 2022-02-12 DIAGNOSIS — E7800 Pure hypercholesterolemia, unspecified: Secondary | ICD-10-CM | POA: Diagnosis not present

## 2022-02-12 DIAGNOSIS — I10 Essential (primary) hypertension: Secondary | ICD-10-CM | POA: Diagnosis not present

## 2022-02-12 NOTE — Progress Notes (Signed)
Subjective:    Patient ID: Chad Serrano, male    DOB: [birth year], 76 y.o.   MRN: [id number] 12/28/21 Patient is a very pleasant 77 year old Caucasian gentleman who has a history of cryptogenic stroke, hypertension, and hyperlipidemia as well as gout.  He is due for fasting lab work.  Overall he is doing well.  The only concern he has is a chronic cough.  I have seen him for this in the past and treating with Xyzal, Flonase, and a proton pump inhibitor.  He has no history of asthma.  He states the cough previously just went away gradually.  Now the cough seems to be every time he eats particularly breakfast and supper.  He denies any fevers or chills or hemoptysis.  He denies any wheezing.  He does have some postnasal drip.  He denies any acid reflux.  Otherwise he is doing fine.  At that time, my plan was: Patient's blood pressure today is elevated however he is checking it frequently at home.  At home his blood pressures typically 110-130/70-80.  He will continue to monitor this.  I would like him to return fasting at his earliest convenience for a CBC, CMP, and a lipid panel.  Given his history of stroke I would like his LDL cholesterol to be below 70.  Return fasting to check a uric acid level.  Ideally uric acid level is less than 6.  I would also like to screen for prostate cancer with a PSA.  He will continue Flonase and Xyzal for postnasal drip however have asked him to resume Protonix for possible laryngoesophageal reflux 02/12/22 Patient is here today for physical exam.  Last colonoscopy was 2019.  At that time they recommended repeating a colonoscopy in 2024 due to the presence of a sessile polyp.  Therefore he is due for this next year he had a battery of blood test checked at his last visit.  At his last visit, I started him on Protonix for chronic GERD.  Patient saw no benefit from this.  Therefore I encouraged him to stop the pantoprazole.  However he denies any wheezing or chest pain or  shortness of breath.  He feels like is more of a drainage or irritation that causes him to have to clear his throat.  He denies any wheezing or pleurisy or dyspnea on exertion.  He works out every day.  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  The patient and I both believe this is not necessary.  He believes it is more of an irritant cough.  His colonoscopy is not due until next year.  He is due for a flu shot, shingles vaccine, COVID booster.  The results are included below: Orders Only on 02/09/2022  Component Date Value Ref Range Status   Cholesterol 02/09/2022 141  <200 mg/dL Final   HDL 02/09/2022 54  > OR = 40 mg/dL Final   Triglycerides 02/09/2022 77  <150 mg/dL Final   LDL Cholesterol (Calc) 02/09/2022 71  mg/dL (calc) Final   Comment: Reference range: <100 . Desirable range <100 mg/dL for primary prevention;   <70 mg/dL for patients with CHD or diabetic patients  with > or = 2 CHD risk factors. Marland Kitchen LDL-C is now calculated using the Martin-Hopkins  calculation, which is a validated novel method providing  better accuracy than the Friedewald equation in the  estimation of LDL-C.  Cresenciano Genre et al. Annamaria Helling.  9381;829(93): 2061-2068  (http://education.[facility].com/faq/FAQ164)    Total CHOL/HDL Ratio 02/09/2022 2.6  <5.0 (calc) Final   Non-HDL Cholesterol (Calc) 02/09/2022 87  <130 mg/dL (calc) Final   Comment: For patients with diabetes plus 1 major ASCVD risk  factor, treating to a non-HDL-C goal of <100 mg/dL  (LDL-C of <70 mg/dL) is considered a therapeutic  option.    PSA 02/09/2022 0.65  < OR = 4.00 ng/mL Final   Comment: The total PSA value from this assay system is  standardized against the WHO standard. The test  result will be approximately 20% lower when compared  to the equimolar-standardized total PSA (Beckman  Coulter). Comparison of serial PSA results should be  interpreted with this fact in  mind. . This test was performed using the Siemens  chemiluminescent method. Values obtained from  different assay methods cannot be used interchangeably. PSA levels, regardless of value, should not be interpreted as absolute evidence of the presence or absence of disease.    Glucose, Bld 02/09/2022 89  65 - 99 mg/dL Final   Comment: .            Fasting reference interval .    BUN 02/09/2022 15  7 - 25 mg/dL Final   Creat 02/09/2022 1.09  0.70 - 1.28 mg/dL Final   BUN/Creatinine Ratio 02/09/2022 SEE NOTE:  6 - 22 (calc) Final   Comment:    Not Reported: BUN and Creatinine are within    reference range. .    Sodium 02/09/2022 142  135 - 146 mmol/L Final   Potassium 02/09/2022 4.2  3.5 - 5.3 mmol/L Final   Chloride 02/09/2022 107  98 - 110 mmol/L Final   CO2 02/09/2022 26  20 - 32 mmol/L Final   Calcium 02/09/2022 8.6  8.6 - 10.3 mg/dL Final   Total Protein 02/09/2022 5.7 (L)  6.1 - 8.1 g/dL Final   Albumin 02/09/2022 3.6  3.6 - 5.1 g/dL Final   Globulin 02/09/2022 2.1  1.9 - 3.7 g/dL (calc) Final   AG Ratio 02/09/2022 1.7  1.0 - 2.5 (calc) Final   Total Bilirubin 02/09/2022 1.2  0.2 - 1.2 mg/dL Final   Alkaline phosphatase (APISO) 02/09/2022 63  35 - 144 U/L Final   AST 02/09/2022 24  10 - 35 U/L Final   ALT 02/09/2022 20  9 - 46 U/L Final   WBC 02/09/2022 5.6  3.8 - 10.8 Thousand/uL Final   RBC 02/09/2022 4.33  4.20 - 5.80 Million/uL Final   Hemoglobin 02/09/2022 14.4  13.2 - 17.1 g/dL Final   HCT 02/09/2022 42.3  38.5 - 50.0 % Final   MCV 02/09/2022 97.7  80.0 - 100.0 fL Final   MCH 02/09/2022 33.3 (H)  27.0 - 33.0 pg Final   MCHC 02/09/2022 34.0  32.0 - 36.0 g/dL Final   RDW 02/09/2022 12.3  11.0 - 15.0 % Final   Platelets 02/09/2022 133 (L)  140 - 400 Thousand/uL Final   MPV 02/09/2022 10.8  7.5 - 12.5 fL Final   Neutro Abs 02/09/2022 1,831  1,500 - 7,800 cells/uL Final   Lymphs Abs 02/09/2022 3,024  850 - 3,900 cells/uL Final   Absolute Monocytes 02/09/2022 594  200  - 950 cells/uL Final   Eosinophils Absolute 02/09/2022 101  15 - 500 cells/uL Final   Basophils Absolute 02/09/2022 50  0 - 200 cells/uL Final   Neutrophils Relative % 02/09/2022 32.7  % Final   Total Lymphocyte 02/09/2022 54.0  % Final  Monocytes Relative 02/09/2022 10.6  % Final   Eosinophils Relative 02/09/2022 1.8  % Final   Basophils Relative 02/09/2022 0.9  % Final    Past Medical History:  Diagnosis Date   Anxiety    Chronic kidney disease    Gout    Hyperlipidemia    Hypertension    Hypogonadism male    Hypogonadism male    Melanoma ([place])    at T4 surgically excised at [facility] (2020) free margins and sentinel node biopsy negative   Melanoma ([place])    Stroke (H[facility])    cryptogenic, right retinal artery occlusion   Past Surgical History:  Procedure Laterality Date   APPENDECTOMY     CHOLECYSTECTOMY  06/14/2020   COLONOSCOPY     GALLBLADDER SURGERY  05/2020   ROTATOR CUFF REPAIR Right    Current Outpatient Medications on File Prior to Visit  Medication Sig Dispense Refill   allopurinol (ZYLOPRIM) 100 MG tablet Take 2 tablets (200 mg total) by mouth daily. 180 tablet 3   Ascorbic Acid (VITAMIN C) 1000 MG tablet Take 1,000 mg by mouth daily.     atorvastatin (LIPITOR) 40 MG tablet TAKE 1 TABLET BY MOUTH DAILY AT 6 PM 90 tablet 2   Cholecalciferol (VITAMIN D3) 2000 UNITS TABS Take 2,000 Units by mouth daily.      clopidogrel (PLAVIX) 75 MG tablet TAKE 1 TABLET BY MOUTH EVERY DAY 90 tablet 3   desoximetasone (TOPICORT) 0.25 % cream APPLY SPARINGLY TO AFFECTED AREA TWICE A DAY     L-Arginine 500 MG TABS Take 500 mg by mouth daily.      losartan (COZAAR) 100 MG tablet TAKE 1 TABLET BY MOUTH EVERY DAY 90 tablet 2   Magnesium 500 MG CAPS Take 500 mg by mouth daily.      Omega-3 Fatty Acids (FISH OIL) 1000 MG CAPS Take 2,000 mg by mouth daily.      tiZANidine (ZANAFLEX) 4 MG tablet Take 1 tablet (4 mg total) by mouth every 6 (six) hours as needed for muscle spasms. 30 tablet 0    traMADol (ULTRAM) 50 MG tablet Take 50 mg by mouth every 12 (twelve) hours as needed for moderate pain.     vitamin B-12 (CYANOCOBALAMIN) 1000 MCG tablet Take 1,000 mcg by mouth daily.      Zinc 50 MG TABS Take 50 mg by mouth daily.      No current facility-administered medications on file prior to visit.   No Known Allergies Social History   Socioeconomic History   Marital status: Married    Spouse name: Not on file   Number of children: Not on file   Years of education: Not on file   Highest education level: Not on file  Occupational History   Not on file  Tobacco Use   Smoking status: Never   Smokeless tobacco: Never  Substance and Sexual Activity   Alcohol use: No   Drug use: No   Sexual activity: Not Currently    Birth control/protection: None    Comment: married to [place].  Retired.  Weightlifter  Other Topics Concern   Not on file  Social History Narrative   Not on file   Social Determinants of Health   Financial Resource Strain: Low Risk  (09/22/2021)   Overall Financial Resource Strain (CARDIA)    Difficulty of Paying Living Expenses: Not hard at all  Food Insecurity: No Food Insecurity (09/22/2021)   Hunger Vital Sign    Worried About Running Out  of Food in the Last Year: Never true    [place] in the Last Year: Never true  Transportation Needs: No Transportation Needs (09/22/2021)   PRAPARE - Hydrologist (Medical): No    Lack of Transportation (Non-Medical): No  Physical Activity: Sufficiently Active (09/22/2021)   Exercise Vital Sign    Days of Exercise per Week: 3 days    Minutes of Exercise per Session: 90 min  Stress: No Stress Concern Present (09/22/2021)   [place]    Feeling of Stress : Not at all  Social Connections: [place] (09/22/2021)   Social Connection and Isolation Panel [NHANES]    Frequency of Communication with Friends and Family:  More than three times a week    Frequency of Social Gatherings with Friends and Family: More than three times a week    Attends Religious Services: More than 4 times per year    Active Member of [employer] or Organizations: Yes    Attends Music therapist: More than 4 times per year    Marital Status: Married  Human resources officer Violence: Not At Risk (09/22/2021)   Humiliation, Afraid, Rape, and Kick questionnaire    Fear of Current or Ex-Partner: No    Emotionally Abused: No    Physically Abused: No    Sexually Abused: No   Family History  Problem Relation Age of Onset   Colon polyps Father    Cancer Mother    Colon cancer Neg Hx    Esophageal cancer Neg Hx    Rectal cancer Neg Hx    Stomach cancer Neg Hx       Review of Systems  All other systems reviewed and are negative.      Objective:   Physical Exam Vitals reviewed.  Constitutional:      General: He is not in acute distress.    Appearance: Normal appearance. He is not ill-appearing, toxic-appearing or diaphoretic.  HENT:     Head: Normocephalic and atraumatic.     Right Ear: Tympanic membrane and ear canal normal. There is no impacted cerumen.     Left Ear: Tympanic membrane and ear canal normal. There is no impacted cerumen.     Nose: No congestion or rhinorrhea.     Mouth/Throat:     Mouth: Mucous membranes are moist.     Pharynx: Oropharynx is clear. No oropharyngeal exudate or posterior oropharyngeal erythema.  Eyes:     General: No scleral icterus.       Right eye: No discharge.        Left eye: No discharge.     Extraocular Movements: Extraocular movements intact.     Conjunctiva/sclera: Conjunctivae normal.     Pupils: Pupils are equal, round, and reactive to light.  Neck:     Vascular: No carotid bruit.  Cardiovascular:     Rate and Rhythm: Normal rate and regular rhythm.     Heart sounds: No murmur heard.    No friction rub. No gallop.  Pulmonary:     Effort: Pulmonary effort is normal. No  respiratory distress.     Breath sounds: Normal breath sounds. No stridor. No wheezing, rhonchi or rales.  Chest:     Chest wall: No tenderness.  Abdominal:     General: Abdomen is flat. Bowel sounds are normal. There is no distension.     Palpations: Abdomen is soft.     Tenderness:  There is no abdominal tenderness. There is no right CVA tenderness, left CVA tenderness, guarding or rebound.     Hernia: No hernia is present.  Musculoskeletal:     Cervical back: Normal range of motion and neck supple. No rigidity or tenderness.     Right lower leg: No edema.     Left lower leg: No edema.  Lymphadenopathy:     Cervical: No cervical adenopathy.  Skin:    General: Skin is warm.     Coloration: Skin is not jaundiced or pale.     Findings: No bruising, erythema, lesion or rash.  Neurological:     General: No focal deficit present.     Mental Status: He is alert and oriented to person, place, and time. Mental status is at baseline.     Cranial Nerves: No cranial nerve deficit.     Sensory: No sensory deficit.     Motor: No weakness.     Coordination: Coordination normal.     Gait: Gait normal.     Deep Tendon Reflexes: Reflexes normal.  Psychiatric:        Mood and Affect: Mood normal.        Behavior: Behavior normal.        Thought Content: Thought content normal.        Judgment: Judgment normal.       Assessment & Plan:  Chronic cough - Plan: DG Chest 2 View  Pure hypercholesterolemia  Cryptogenic stroke ([place])  Primary hypertension  Encounter for Medicare annual wellness exam Because of a chronic cough, we will get a chest x-ray.  Together we have decided that if the chest x-ray is clear we will not pursue further work-up.  The neck step will be a pulmonology consultation and possibly a high-resolution CT scan but the patient feels this is most likely an irritant drainage cough and I tend to agree with him.  His blood pressure today is excellent.  He received his flu shot.   I recommended a COVID booster and also Shingrix.  Colonoscopy is due next year.  His lab work is exceptional except for a slightly low platelet count and a slightly low protein level.  I believe both of these are likely due to the aging process and not an indication of a serious underlying medical illness

## 2022-02-12 NOTE — Addendum Note (Signed)
Addended by: Randal Buba K on: 02/12/2022 02:45 PM   Modules accepted: Orders

## 2022-02-13 ENCOUNTER — Ambulatory Visit
Admission: RE | Admit: 2022-02-13 | Discharge: 2022-02-13 | Disposition: A | Discharge status: Home / Self Care | Payer: Medicare Other | Source: Ambulatory Visit | Attending: Family Medicine | Admitting: Family Medicine

## 2022-02-13 DIAGNOSIS — R053 Chronic cough: Secondary | ICD-10-CM

## 2022-02-13 DIAGNOSIS — R059 Cough, unspecified: Secondary | ICD-10-CM | POA: Diagnosis not present

## 2022-04-03 DIAGNOSIS — D3617 Benign neoplasm of peripheral nerves and autonomic nervous system of trunk, unspecified: Secondary | ICD-10-CM | POA: Diagnosis not present

## 2022-04-03 DIAGNOSIS — L218 Other seborrheic dermatitis: Secondary | ICD-10-CM | POA: Diagnosis not present

## 2022-04-03 DIAGNOSIS — L812 Freckles: Secondary | ICD-10-CM | POA: Diagnosis not present

## 2022-04-03 DIAGNOSIS — L738 Other specified follicular disorders: Secondary | ICD-10-CM | POA: Diagnosis not present

## 2022-04-03 DIAGNOSIS — L821 Other seborrheic keratosis: Secondary | ICD-10-CM | POA: Diagnosis not present

## 2022-04-03 DIAGNOSIS — Z85820 Personal history of malignant melanoma of skin: Secondary | ICD-10-CM | POA: Diagnosis not present

## 2022-04-03 DIAGNOSIS — D0472 Carcinoma in situ of skin of left lower limb, including hip: Secondary | ICD-10-CM | POA: Diagnosis not present

## 2022-04-03 DIAGNOSIS — D692 Other nonthrombocytopenic purpura: Secondary | ICD-10-CM | POA: Diagnosis not present

## 2022-04-03 DIAGNOSIS — L570 Actinic keratosis: Secondary | ICD-10-CM | POA: Diagnosis not present

## 2022-04-03 DIAGNOSIS — D485 Neoplasm of uncertain behavior of skin: Secondary | ICD-10-CM | POA: Diagnosis not present

## 2022-04-03 DIAGNOSIS — L400 Psoriasis vulgaris: Secondary | ICD-10-CM | POA: Diagnosis not present

## 2022-04-23 DIAGNOSIS — M5416 Radiculopathy, lumbar region: Secondary | ICD-10-CM | POA: Diagnosis not present

## 2022-07-06 DIAGNOSIS — C4359 Malignant melanoma of other part of trunk: Secondary | ICD-10-CM | POA: Diagnosis not present

## 2022-07-14 ENCOUNTER — Other Ambulatory Visit: Payer: Self-pay | Admitting: Family Medicine

## 2022-07-16 NOTE — Telephone Encounter (Signed)
6 month supply given.   Due to a glitch in the system the protocol is not showing the last office visits so protocol is indicating a visit is due however it is not.  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  Upcoming appt. For 02/14/2023.  LOV 12/28/2021 and 02/12/2022.    Lipid panel is in date.  Requested Prescriptions  Pending Prescriptions Disp Refills   atorvastatin (LIPITOR) 40 MG tablet [Pharmacy Med Name: ATORVASTATIN 40 MG TABLET] 90 tablet 1    Sig: TAKE 1 TABLET BY MOUTH DAILY AT 6 PM     Cardiovascular:  Antilipid - Statins Failed - 07/14/2022  1:14 AM      Failed - Valid encounter within last 12 months    Recent Outpatient Visits           1 year ago Chronic cough   Chad Serrano, Chad Mcgee, Chad Serrano   1 year ago Excessive cerumen in left ear canal   Chad Eulogio Bear, Chad Serrano   1 year ago Left flank pain, chronic   Chad Serrano, Chad Mcgee, Chad Serrano   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[facility])   Chad Serrano, Chad Mcgee, Chad Serrano   1 year ago Wound of left cheek, initial encounter   [place], Chad Serrano       Future Appointments             In 7 months Dennard Serrano, Chad Mcgee, Chad Serrano [facility]            Failed - Lipid Panel in normal range within the last 12 months    Cholesterol  Date Value Ref Range Status  02/09/2022 141 <200 mg/dL Final   LDL Cholesterol (Calc)  Date Value Ref Range Status  02/09/2022 71 mg/dL (calc) Final    Comment:    Reference range: <100 . Desirable range <100 mg/dL for primary prevention;   <70 mg/dL for patients with CHD or diabetic patients  with > or = 2 CHD risk factors. Marland Kitchen LDL-C is now calculated using the Martin-Hopkins  calculation, which is a validated novel method providing  better accuracy than the Friedewald equation in the  estimation of LDL-C.  Cresenciano Genre et al. Annamaria Helling. MU:[id number]): 2061-2068   (http://education.[facility].com/faq/FAQ164)    HDL  Date Value Ref Range Status  02/09/2022 54 > OR = 40 mg/dL Final   Triglycerides  Date Value Ref Range Status  02/09/2022 77 <150 mg/dL Final         Passed - Patient is not pregnant

## 2022-07-19 ENCOUNTER — Telehealth: Payer: Self-pay | Admitting: Family Medicine

## 2022-07-19 ENCOUNTER — Other Ambulatory Visit: Payer: Self-pay | Admitting: Family Medicine

## 2022-07-19 MED ORDER — NIRMATRELVIR/RITONAVIR (PAXLOVID)TABLET: 3.0000 | ORAL_TABLET | Freq: Two times a day (BID) | ORAL | 0 refills | Status: AC

## 2022-07-19 NOTE — Telephone Encounter (Signed)
Patient called to report a positive COVID test taken just a few minutes ago.   Requesting for script to be sent to pharmacy for IVERMECTIN (preferrably), or PAXLOVID if IVERMECTIN isn't available  Sx began over the weekend.  Sx: cough (drying up), runny nose (drying up), low grade fever of 99.7, heart rate and b/p elevated (face feels flushed).   [facility]  [id number] [place], [place]Phone: [phone] Fax: [phone] #: [id number]  Please advise patient when script sent in at [phone]

## 2022-07-20 ENCOUNTER — Ambulatory Visit: Payer: Medicare Other | Admitting: Family Medicine

## 2022-07-26 ENCOUNTER — Other Ambulatory Visit: Payer: Self-pay | Admitting: Family Medicine

## 2022-07-30 DIAGNOSIS — M5416 Radiculopathy, lumbar region: Secondary | ICD-10-CM | POA: Diagnosis not present

## 2022-08-30 ENCOUNTER — Other Ambulatory Visit: Payer: Self-pay | Admitting: Family Medicine

## 2022-08-30 NOTE — Telephone Encounter (Signed)
Requested Prescriptions  Pending Prescriptions Disp Refills   clopidogrel (PLAVIX) 75 MG tablet [Pharmacy Med Name: CLOPIDOGREL 75 MG TABLET] 90 tablet 1    Sig: TAKE 1 TABLET BY MOUTH EVERY DAY     Hematology: Antiplatelets - clopidogrel Failed - 08/30/2022  1:50 AM      Failed - HCT in normal range and within 180 days    HCT  Date Value Ref Range Status  02/09/2022 42.3 38.5 - 50.0 % Final         Failed - HGB in normal range and within 180 days    Hemoglobin  Date Value Ref Range Status  02/09/2022 14.4 13.2 - 17.1 g/dL Final         Failed - PLT in normal range and within 180 days    Platelets  Date Value Ref Range Status  02/09/2022 133 (L) 140 - 400 Thousand/uL Final         Failed - Valid encounter within last 6 months    Recent Outpatient Visits           1 year ago Chronic cough   [facility] Tanya Nones, Priscille Heidelberg, MD   1 year ago Excessive cerumen in left ear canal   [facility] Valentino Nose, NP   1 year ago Left flank pain, chronic   [facility] Tanya Nones, Priscille Heidelberg, MD   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[facility])   [facility] Donita Brooks, MD   2 years ago Wound of left cheek, initial encounter   [facility] Valentino Nose, NP       Future Appointments             In 5 months Pickard, Priscille Heidelberg, MD [facility]            Passed - Cr in normal range and within 360 days    Creat  Date Value Ref Range Status  02/09/2022 1.09 0.70 - 1.28 mg/dL Final

## 2022-09-04 ENCOUNTER — Telehealth: Payer: Self-pay

## 2022-09-04 NOTE — Telephone Encounter (Signed)
Prescription Request  09/04/2022  LOV: 02/10/22  What is the name of the medication or equipment? tiZANidine (ZANAFLEX) 4 MG  Have you contacted your pharmacy to request a refill? Yes   Which pharmacy would you like this sent to?  [id number] [place], [place] - [address] [place] [address] Phone: [phone] Fax: [phone]    Patient notified that their request is being sent to the clinical staff for review and that they should receive a response within 2 business days.   Please advise at [facility] [phone]

## 2022-09-06 ENCOUNTER — Encounter: Payer: Self-pay | Admitting: Family Medicine

## 2022-09-06 ENCOUNTER — Ambulatory Visit (INDEPENDENT_AMBULATORY_CARE_PROVIDER_SITE_OTHER): Payer: Medicare Other | Admitting: Family Medicine

## 2022-09-06 VITALS — BP 136/78 | HR 93 | Temp 98.5°F | Ht 71.0 in | Wt 221.2 lb

## 2022-09-06 DIAGNOSIS — Z1211 Encounter for screening for malignant neoplasm of colon: Secondary | ICD-10-CM

## 2022-09-06 DIAGNOSIS — Z23 Encounter for immunization: Secondary | ICD-10-CM | POA: Diagnosis not present

## 2022-09-06 DIAGNOSIS — M62830 Muscle spasm of back: Secondary | ICD-10-CM

## 2022-09-06 MED ORDER — TIZANIDINE HCL 4 MG PO TABS: 4.0000 mg | ORAL_TABLET | Freq: Four times a day (QID) | ORAL | 0 refills | Status: DC | PRN

## 2022-09-06 NOTE — Progress Notes (Signed)
Subjective:    Patient ID: Chad Serrano, male    DOB: May 24, 1945, 77 y.o.   MRN: [id number] Patient is a very pleasant 77 year old Caucasian gentleman.  He is on Plavix for secondary prevention of stroke.  Therefore he is unable to take NSAIDs.  He has a history of occasional back pain and muscle spasms in his back.  He does a lot of physical activity including weightlifting, exercise, and yard work.  Recently has been doing more yard work and has been having muscle spasms in his back.  Tizanidine provides some relief.  He denies any confusion, sedation, or dizziness taking the tizanidine.  He is requesting a refill.  He is due for shingles vaccine.  He would like to get this today if his insurance will cover it. Past Medical History:  Diagnosis Date   Anxiety    Chronic kidney disease    Gout    Hyperlipidemia    Hypertension    Hypogonadism male    Hypogonadism male    Melanoma    at T4 surgically excised at [facility] (2020) free margins and sentinel node biopsy negative   Melanoma    Stroke    cryptogenic, right retinal artery occlusion   Past Surgical History:  Procedure Laterality Date   APPENDECTOMY     CHOLECYSTECTOMY  06/14/2020   COLONOSCOPY     GALLBLADDER SURGERY  05/2020   ROTATOR CUFF REPAIR Right    Current Outpatient Medications on File Prior to Visit  Medication Sig Dispense Refill   allopurinol (ZYLOPRIM) 100 MG tablet Take 2 tablets (200 mg total) by mouth daily. 180 tablet 3   Ascorbic Acid (VITAMIN C) 1000 MG tablet Take 1,000 mg by mouth daily.     atorvastatin (LIPITOR) 40 MG tablet TAKE 1 TABLET BY MOUTH DAILY AT 6 PM 90 tablet 1   Cholecalciferol (VITAMIN D3) 2000 UNITS TABS Take 2,000 Units by mouth daily.      clopidogrel (PLAVIX) 75 MG tablet TAKE 1 TABLET BY MOUTH EVERY DAY 90 tablet 1   desoximetasone (TOPICORT) 0.25 % cream APPLY SPARINGLY TO AFFECTED AREA TWICE A DAY     L-Arginine 500 MG TABS Take 500 mg by mouth daily.      losartan (COZAAR) 100 MG  tablet TAKE 1 TABLET BY MOUTH EVERY DAY 90 tablet 2   Magnesium 500 MG CAPS Take 500 mg by mouth daily.      Omega-3 Fatty Acids (FISH OIL) 1000 MG CAPS Take 2,000 mg by mouth daily.      tiZANidine (ZANAFLEX) 4 MG tablet Take 1 tablet (4 mg total) by mouth every 6 (six) hours as needed for muscle spasms. 30 tablet 0   traMADol (ULTRAM) 50 MG tablet Take 50 mg by mouth every 12 (twelve) hours as needed for moderate pain.     vitamin B-12 (CYANOCOBALAMIN) 1000 MCG tablet Take 1,000 mcg by mouth daily.      Zinc 50 MG TABS Take 50 mg by mouth daily.      No current facility-administered medications on file prior to visit.   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  Other Topics Concern   Not on file  Social History Narrative   Not on file   Social Determinants of Health   Financial Resource Strain: Low Risk  (09/05/2022)   Overall Financial Resource Strain (CARDIA)    Difficulty of Paying Living Expenses: Not hard at all  Food Insecurity: No Food Insecurity (09/05/2022)   Hunger Vital Sign    Worried About Running Out of Food in the Last Year: Never true    Ran Out of Food in the Last Year: Never true  Transportation Needs: No Transportation Needs (09/05/2022)   PRAPARE - Administrator, Civil Service (Medical): No    Lack of Transportation (Non-Medical): No  Physical Activity: Sufficiently Active (09/05/2022)   Exercise Vital Sign    Days of Exercise per Week: 3 days    Minutes of  Exercise per Session: 90 min  Stress: No Stress Concern Present (09/05/2022)   [employer] of Occupational Health - Occupational Stress Questionnaire    Feeling of Stress : Only a little  Social Connections: Socially Integrated (09/05/2022)   Social Connection and Isolation Panel [NHANES]    Frequency of Communication with Friends and Family: Three times a week    Frequency of Social Gatherings with Friends and Family: Three times a week    Attends Religious Services: More than 4 times per year    Active Member of Clubs or Organizations: Yes    Attends Banker Meetings: More than 4 times per year    Marital Status: Married  Catering manager Violence: Not At Risk (09/22/2021)   Humiliation, Afraid, Rape, and Kick questionnaire    Fear of Current or Ex-Partner: No    Emotionally Abused: No    Physically Abused: No    Sexually Abused: No   Family History  Problem Relation Age of Onset   Colon polyps Father    Cancer Mother    Colon cancer Neg Hx    Esophageal cancer Neg Hx    Rectal cancer Neg Hx    Stomach cancer Neg Hx       Review of Systems  All other systems reviewed and are negative.      Objective:   Physical Exam Vitals reviewed.  Constitutional:      General: He is not in acute distress.    Appearance: Normal appearance. He is not ill-appearing, toxic-appearing or diaphoretic.  HENT:     Head: Normocephalic and atraumatic.  Cardiovascular:     Rate and Rhythm: Normal rate and regular rhythm.     Heart sounds: No murmur heard.    No friction rub. No gallop.  Pulmonary:     Effort: Pulmonary effort is normal. No respiratory distress.     Breath sounds: Normal breath sounds. No stridor. No wheezing, rhonchi or rales.  Chest:     Chest wall: No tenderness.  Neurological:     General: No focal deficit present.     Mental Status: He is alert and oriented to person, place, and time. Mental status is at baseline.     Cranial Nerves: No cranial  nerve deficit.  Psychiatric:        Mood and Affect: Mood normal.        Behavior: Behavior normal.  Thought Content: Thought content normal.        Judgment: Judgment normal.       Assessment & Plan:  Back muscle spasm I will be glad to refill the patient's tizanidine.  I will give him 90 tablets that way he can have plenty of refills to last him more than a year.  He uses the medication occasionally.  I cautioned him to watch for any sedation or confusion on the medication.  If it causes any confusion or dizziness he is to stop the medication immediately.  Also offer the patient shingles vaccine here today

## 2022-09-18 DIAGNOSIS — H52223 Regular astigmatism, bilateral: Secondary | ICD-10-CM | POA: Diagnosis not present

## 2022-09-18 DIAGNOSIS — H5203 Hypermetropia, bilateral: Secondary | ICD-10-CM | POA: Diagnosis not present

## 2022-09-18 DIAGNOSIS — H524 Presbyopia: Secondary | ICD-10-CM | POA: Diagnosis not present

## 2022-09-18 DIAGNOSIS — H2513 Age-related nuclear cataract, bilateral: Secondary | ICD-10-CM | POA: Diagnosis not present

## 2022-09-18 DIAGNOSIS — H53143 Visual discomfort, bilateral: Secondary | ICD-10-CM | POA: Diagnosis not present

## 2022-09-18 DIAGNOSIS — H3411 Central retinal artery occlusion, right eye: Secondary | ICD-10-CM | POA: Diagnosis not present

## 2022-09-25 ENCOUNTER — Other Ambulatory Visit: Payer: Self-pay | Admitting: Family Medicine

## 2022-10-03 DIAGNOSIS — L812 Freckles: Secondary | ICD-10-CM | POA: Diagnosis not present

## 2022-10-03 DIAGNOSIS — D485 Neoplasm of uncertain behavior of skin: Secondary | ICD-10-CM | POA: Diagnosis not present

## 2022-10-03 DIAGNOSIS — D2339 Other benign neoplasm of skin of other parts of face: Secondary | ICD-10-CM | POA: Diagnosis not present

## 2022-10-03 DIAGNOSIS — L570 Actinic keratosis: Secondary | ICD-10-CM | POA: Diagnosis not present

## 2022-10-03 DIAGNOSIS — L720 Epidermal cyst: Secondary | ICD-10-CM | POA: Diagnosis not present

## 2022-10-03 DIAGNOSIS — M7130 Other bursal cyst, unspecified site: Secondary | ICD-10-CM | POA: Diagnosis not present

## 2022-10-03 DIAGNOSIS — L400 Psoriasis vulgaris: Secondary | ICD-10-CM | POA: Diagnosis not present

## 2022-10-03 DIAGNOSIS — L821 Other seborrheic keratosis: Secondary | ICD-10-CM | POA: Diagnosis not present

## 2022-10-03 DIAGNOSIS — L820 Inflamed seborrheic keratosis: Secondary | ICD-10-CM | POA: Diagnosis not present

## 2022-10-03 DIAGNOSIS — Z85820 Personal history of malignant melanoma of skin: Secondary | ICD-10-CM | POA: Diagnosis not present

## 2022-10-03 DIAGNOSIS — L218 Other seborrheic dermatitis: Secondary | ICD-10-CM | POA: Diagnosis not present

## 2022-10-03 DIAGNOSIS — D1801 Hemangioma of skin and subcutaneous tissue: Secondary | ICD-10-CM | POA: Diagnosis not present

## 2022-10-31 DIAGNOSIS — M5416 Radiculopathy, lumbar region: Secondary | ICD-10-CM | POA: Diagnosis not present

## 2022-12-18 DIAGNOSIS — H2513 Age-related nuclear cataract, bilateral: Secondary | ICD-10-CM | POA: Diagnosis not present

## 2022-12-18 DIAGNOSIS — H18413 Arcus senilis, bilateral: Secondary | ICD-10-CM | POA: Diagnosis not present

## 2022-12-18 DIAGNOSIS — H2512 Age-related nuclear cataract, left eye: Secondary | ICD-10-CM | POA: Diagnosis not present

## 2022-12-18 DIAGNOSIS — H25013 Cortical age-related cataract, bilateral: Secondary | ICD-10-CM | POA: Diagnosis not present

## 2022-12-18 DIAGNOSIS — H3411 Central retinal artery occlusion, right eye: Secondary | ICD-10-CM | POA: Diagnosis not present

## 2022-12-18 DIAGNOSIS — H25043 Posterior subcapsular polar age-related cataract, bilateral: Secondary | ICD-10-CM | POA: Diagnosis not present

## 2023-01-11 ENCOUNTER — Other Ambulatory Visit: Payer: Self-pay | Admitting: Family Medicine

## 2023-01-11 NOTE — Telephone Encounter (Signed)
Due to a system glitch the last office visit for this practice is not detected.   LOV 09/06/2022.   Labs are in date.  He has an upcoming appt.  Requested Prescriptions  Pending Prescriptions Disp Refills   atorvastatin (LIPITOR) 40 MG tablet [Pharmacy Med Name: ATORVASTATIN 40 MG TABLET] 90 tablet 0    Sig: TAKE 1 TABLET BY MOUTH DAILY AT 6 PM     Cardiovascular:  Antilipid - Statins Failed - 01/11/2023  1:44 AM      Failed - Valid encounter within last 12 months    Recent Outpatient Visits           1 year ago Chronic cough   [facility] Tanya Nones, Priscille Heidelberg, MD   1 year ago Excessive cerumen in left ear canal   [facility] Valentino Nose, NP   1 year ago Left flank pain, chronic   [facility] Tanya Nones, Priscille Heidelberg, MD   2 years ago Cryptogenic stroke [facility])   [facility] Tanya Nones, Priscille Heidelberg, MD   2 years ago Wound of left cheek, initial encounter   [facility] Valentino Nose, NP       Future Appointments             In 1 month Pickard, Priscille Heidelberg, MD [facility]            Failed - Lipid Panel in normal range within the last 12 months    Cholesterol  Date Value Ref Range Status  02/09/2022 141 <200 mg/dL Final   LDL Cholesterol (Calc)  Date Value Ref Range Status  02/09/2022 71 mg/dL (calc) Final    Comment:    Reference range: <100 . Desirable range <100 mg/dL for primary prevention;   <70 mg/dL for patients with CHD or diabetic patients  with > or = 2 CHD risk factors. Marland Kitchen LDL-C is now calculated using the Martin-Hopkins  calculation, which is a validated novel method providing  better accuracy than the Friedewald equation in the  estimation of LDL-C.  Horald Pollen et al. Lenox Ahr. 9604;540(98): 2061-2068  (http://education.[facility].com/faq/FAQ164)    HDL  Date Value Ref Range Status  02/09/2022 54 > OR = 40 mg/dL Final   Triglycerides   Date Value Ref Range Status  02/09/2022 77 <150 mg/dL Final         Passed - Patient is not pregnant

## 2023-01-22 NOTE — Telephone Encounter (Signed)
Pharmacy sent script to request refills of the following meds:  atorvastatin (LIPITOR) 40 MG tablet [[id number]]   tiZANidine (ZANAFLEX) 4 MG tablet  **90 day scripts requested** **Please see previous messages in thread.  Pharmacy:    Please advise pharmacist.

## 2023-01-25 ENCOUNTER — Telehealth: Payer: Self-pay | Admitting: Family Medicine

## 2023-01-25 ENCOUNTER — Other Ambulatory Visit: Payer: Self-pay | Admitting: Family Medicine

## 2023-01-25 MED ORDER — TIZANIDINE HCL 4 MG PO TABS: 4.0000 mg | ORAL_TABLET | Freq: Four times a day (QID) | ORAL | 0 refills | Status: DC | PRN

## 2023-01-25 NOTE — Telephone Encounter (Signed)
Prescription Request  01/25/2023  LOV: 09/06/2022  What is the name of the medication or equipment? tiZANidine (ZANAFLEX) 4 MG tablet   Have you contacted your pharmacy to request a refill? Yes   Which pharmacy would you like this sent to?  [id number] [place], [place] - [address] [place] [address] Phone: [phone] Fax: [phone]    Patient notified that their request is being sent to the clinical staff for review and that they should receive a response within 2 business days.   Please advise at [facility] [phone]

## 2023-02-01 DIAGNOSIS — M5416 Radiculopathy, lumbar region: Secondary | ICD-10-CM | POA: Diagnosis not present

## 2023-02-05 ENCOUNTER — Encounter: Payer: Self-pay | Admitting: Gastroenterology

## 2023-02-11 ENCOUNTER — Other Ambulatory Visit: Payer: Medicare Other

## 2023-02-11 DIAGNOSIS — I10 Essential (primary) hypertension: Secondary | ICD-10-CM | POA: Diagnosis not present

## 2023-02-11 DIAGNOSIS — E291 Testicular hypofunction: Secondary | ICD-10-CM | POA: Diagnosis not present

## 2023-02-11 DIAGNOSIS — E7800 Pure hypercholesterolemia, unspecified: Secondary | ICD-10-CM | POA: Diagnosis not present

## 2023-02-11 DIAGNOSIS — R972 Elevated prostate specific antigen [PSA]: Secondary | ICD-10-CM

## 2023-02-12 LAB — COMPLETE METABOLIC PANEL WITH GFR
AG Ratio: 1.8 (calc) (ref 1.0–2.5)
ALT: 22 U/L (ref 9–46)
AST: 17 U/L (ref 10–35)
Albumin: 3.8 g/dL (ref 3.6–5.1)
Alkaline phosphatase (APISO): 71 U/L (ref 35–144)
BUN: 21 mg/dL (ref 7–25)
CO2: 26 mmol/L (ref 20–32)
Calcium: 8.6 mg/dL (ref 8.6–10.3)
Chloride: 103 mmol/L (ref 98–110)
Creat: 1.06 mg/dL (ref 0.70–1.28)
Globulin: 2.1 g/dL (calc) (ref 1.9–3.7)
Glucose, Bld: 83 mg/dL (ref 65–99)
Potassium: 4.2 mmol/L (ref 3.5–5.3)
Sodium: 138 mmol/L (ref 135–146)
Total Bilirubin: 1.6 mg/dL — ABNORMAL HIGH (ref 0.2–1.2)
Total Protein: 5.9 g/dL — ABNORMAL LOW (ref 6.1–8.1)
eGFR: 72 mL/min/{1.73_m2} (ref >=60)

## 2023-02-12 LAB — CBC WITH DIFFERENTIAL/PLATELET
Absolute Monocytes: 815 cells/uL (ref 200–950)
Basophils Absolute: 34 cells/uL (ref 0–200)
Basophils Relative: 0.4 %
Eosinophils Absolute: 92 cells/uL (ref 15–500)
Eosinophils Relative: 1.1 %
HCT: 44.3 % (ref 38.5–50.0)
Hemoglobin: 14.8 g/dL (ref 13.2–17.1)
Lymphs Abs: 3814 cells/uL (ref 850–3900)
MCH: 32.8 pg (ref 27.0–33.0)
MCHC: 33.4 g/dL (ref 32.0–36.0)
MCV: 98.2 fL (ref 80.0–100.0)
MPV: 11.5 fL (ref 7.5–12.5)
Monocytes Relative: 9.7 %
Neutro Abs: 3646 cells/uL (ref 1500–7800)
Neutrophils Relative %: 43.4 %
Platelets: 172 10*3/uL (ref 140–400)
RBC: 4.51 10*6/uL (ref 4.20–5.80)
RDW: 13.1 % (ref 11.0–15.0)
Total Lymphocyte: 45.4 %
WBC: 8.4 10*3/uL (ref 3.8–10.8)

## 2023-02-12 LAB — LIPID PANEL
Cholesterol: 130 mg/dL (ref <200)
HDL: 52 mg/dL (ref >=40)
LDL Cholesterol (Calc): 63 mg/dL (calc)
Non-HDL Cholesterol (Calc): 78 mg/dL (calc) (ref <130)
Total CHOL/HDL Ratio: 2.5 (calc) (ref <5.0)
Triglycerides: 73 mg/dL (ref <150)

## 2023-02-12 LAB — PSA: PSA: 0.58 ng/mL (ref <=4.00)

## 2023-02-13 NOTE — Progress Notes (Deleted)
Subjective:   Chad Serrano is a 77 y.o. male who presents for Medicare Annual/Subsequent preventive examination.  Visit Complete: {VISITMETHOD:[phone]}  Patient Medicare AWV questionnaire was completed by the patient on ***; I have confirmed that all information answered by patient is correct and no changes since this date.        Objective:    There were no vitals filed for this visit. There is no height or weight on file to calculate BMI.     09/22/2021    2:09 PM 09/20/2020   10:31 AM 04/28/2020    7:15 AM 08/15/2017    9:16 AM  Advanced Directives  Does Patient Have a Medical Advance Directive? No Yes No Yes  Does patient want to make changes to medical advance directive?  No - Patient declined    Would patient like information on creating a medical advance directive? No - Patient declined  No - Patient declined     Current Medications (verified) Outpatient Encounter Medications as of 02/14/2023  Medication Sig   allopurinol (ZYLOPRIM) 100 MG tablet Take 2 tablets (200 mg total) by mouth daily.   Ascorbic Acid (VITAMIN C) 1000 MG tablet Take 1,000 mg by mouth daily.   atorvastatin (LIPITOR) 40 MG tablet TAKE 1 TABLET BY MOUTH DAILY AT 6 PM   Cholecalciferol (VITAMIN D3) 2000 UNITS TABS Take 2,000 Units by mouth daily.    clopidogrel (PLAVIX) 75 MG tablet TAKE 1 TABLET BY MOUTH EVERY DAY   desoximetasone (TOPICORT) 0.25 % cream APPLY SPARINGLY TO AFFECTED AREA TWICE A DAY   L-Arginine 500 MG TABS Take 500 mg by mouth daily.    losartan (COZAAR) 100 MG tablet TAKE 1 TABLET BY MOUTH EVERY DAY   Magnesium 500 MG CAPS Take 500 mg by mouth daily.    Omega-3 Fatty Acids (FISH OIL) 1000 MG CAPS Take 2,000 mg by mouth daily.    tiZANidine (ZANAFLEX) 4 MG tablet Take 1 tablet (4 mg total) by mouth every 6 (six) hours as needed for muscle spasms.   traMADol (ULTRAM) 50 MG tablet Take 50 mg by mouth every 12 (twelve) hours as needed for moderate pain.   vitamin B-12  (CYANOCOBALAMIN) 1000 MCG tablet Take 1,000 mcg by mouth daily.    Zinc 50 MG TABS Take 50 mg by mouth daily.    No facility-administered encounter medications on file as of 02/14/2023.    Allergies (verified) Patient has no known allergies.   History: Past Medical History:  Diagnosis Date   Anxiety    Chronic kidney disease    Gout    Hyperlipidemia    Hypertension    Hypogonadism male    Hypogonadism male    Melanoma (HCC)    at T4 surgically excised at [facility] (2020) free margins and sentinel node biopsy negative   Melanoma (HCC)    Stroke (HCC)    cryptogenic, right retinal artery occlusion   Past Surgical History:  Procedure Laterality Date   APPENDECTOMY     CHOLECYSTECTOMY  06/14/2020   COLONOSCOPY     GALLBLADDER SURGERY  05/2020   ROTATOR CUFF REPAIR Right    Family History  Problem Relation Age of Onset   Colon polyps Father    Cancer Mother    Colon cancer Neg Hx    Esophageal cancer Neg Hx    Rectal cancer Neg Hx    Stomach cancer Neg Hx    Social History   Socioeconomic History   Marital status: Married  Spouse name: Not on file   Number of children: Not on file   Years of education: Not on file   Highest education level: Bachelor's degree (e.g., BA, AB, BS)  Occupational History   Not on file  Tobacco Use   Smoking status: Never   Smokeless tobacco: Never  Substance and Sexual Activity   Alcohol use: No   Drug use: No   Sexual activity: Not Currently    Birth control/protection: None    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  Other Topics Concern   Not on file  Social History Narrative   Not on file   Social Determinants of Health   Financial Resource Strain: Low Risk  (09/05/2022)   Overall Financial Resource Strain (CARDIA)    Difficulty of Paying Living Expenses: Not hard at all  Food Insecurity: No Food Insecurity (09/05/2022)   Hunger Vital Sign    Worried About Running Out of Food in the Last Year: Never true    Ran  Out of Food in the Last Year: Never true  Transportation Needs: No Transportation Needs (09/05/2022)   PRAPARE - Administrator, Civil Service (Medical): No    Lack of Transportation (Non-Medical): No  Physical Activity: Sufficiently Active (09/05/2022)   Exercise Vital Sign    Days of Exercise per Week: 3 days    Minutes of Exercise per Session: 90 min  Stress: No Stress Concern Present (09/05/2022)   [employer] of Occupational Health - Occupational Stress Questionnaire    Feeling of Stress : Only a little  Social Connections: Socially Integrated (09/05/2022)   Social Connection and Isolation Panel [NHANES]    Frequency of Communication with Friends and Family: Three times a week    Frequency of Social Gatherings with Friends and Family: Three times a week    Attends Religious Services: More than 4 times per year    Active Member of Clubs or Organizations: Yes    Attends Banker Meetings: More than 4 times per year    Marital Status: Married    Tobacco Counseling Counseling given: Not Answered   Clinical Intake:              How often do you need to have someone help you when you read instructions, pamphlets, or other written materials from your doctor or pharmacy?: (P) 1 - Never         Activities of Daily Living    02/13/2023   12:30 PM  In your present state of health, do you have any difficulty performing the following activities:  Hearing? 0  Vision? 0  Difficulty concentrating or making decisions? 0  Walking or climbing stairs? 0  Dressing or bathing? 0  Doing errands, shopping? 0  Preparing Food and eating ? N  Using the Toilet? N  In the past six months, have you accidently leaked urine? N  Do you have problems with loss of bowel control? N  Managing your Medications? N  Managing your Finances? N  Housekeeping or managing your Housekeeping? N    Patient Care Team: Donita Brooks, MD as PCP - General (Family  Medicine) Hillis Range, MD (Inactive) as PCP - Cardiology (Cardiology) Erroll Luna, [facility] (Inactive) as Pharmacist (Pharmacist)  Indicate any recent Medical Services you may have received from other than [facility] providers in the past year (date may be approximate).     Assessment:   This is a routine wellness examination for [place].  Hearing/Vision screen No results found.   Goals Addressed   None    Depression Screen    02/12/2022    9:29 AM 09/22/2021    2:06 PM 09/20/2020   10:28 AM 08/15/2017    9:16 AM 07/26/2017   10:33 AM 07/19/2016   10:32 AM  PHQ 2/9 Scores  PHQ - 2 Score 0 0 0 0 0 0    Fall Risk    02/13/2023   12:30 PM 09/22/2021    2:10 PM 09/20/2020   10:28 AM 08/09/2020   12:01 PM 04/13/2019    4:08 PM  Fall Risk   Falls in the past year? 0 0 0 0 0  Comment     Emmi Telephone Survey: data to providers prior to load  Number falls in past yr:  0  0   Injury with Fall?  0  0   Risk for fall due to :  No Fall Risks No Fall Risks    Follow up  Falls prevention discussed Falls evaluation completed      MEDICARE RISK AT HOME: Medicare Risk at Home Any stairs in or around the home?: (P) Yes If so, are there any without handrails?: (P) Yes Home free of loose throw rugs in walkways, pet beds, electrical cords, etc?: (P) No Adequate lighting in your home to reduce risk of falls?: (P) Yes Life alert?: (P) No Use of a cane, walker or w/c?: (P) Yes Grab bars in the bathroom?: (P) No Shower chair or bench in shower?: (P) Yes Elevated toilet seat or a handicapped toilet?: (P) No  TIMED UP AND GO:  Was the test performed?  Yes  Length of time to ambulate 10 feet: *** sec {Appearance of Gait:2101803}    Cognitive Function:        09/22/2021    2:13 PM  6CIT Screen  What Year? 0 points  What month? 0 points  What time? 0 points  Count back from 20 0 points  Months in reverse 0 points  Repeat phrase 0 points  Total Score 0 points     Immunizations Immunization History  Administered Date(s) Administered   Fluad Quad(high Dose 65+) 02/17/2019   Hepatitis A 11/29/2003, 08/08/2009   Hepatitis B 08/08/2009, 09/13/2009   IPV 05/29/1965   Influenza Split 04/28/2012, 04/13/2013   Influenza Whole 02/16/2011   Influenza, High Dose Seasonal PF 02/25/2017, 03/20/2018   Influenza,inj,Quad PF,6+ Mos 04/30/2014, 05/05/2015, 03/13/2016, 02/12/2022   PFIZER(Purple Top)SARS-COV-2 Vaccination 07/17/2019, 08/11/2019   Plague 03/28/1966   Pneumococcal Conjugate-13 05/11/2013   Pneumococcal Polysaccharide-23 04/30/2014   Td 04/28/2012   Typhoid Parenteral 01/27/1965, 08/08/2009   Zoster Recombinant(Shingrix) 09/06/2022    {TDAP status:2101805}  {Flu Vaccine status:2101806}  Pneumococcal vaccine status: Up to date  Covid-19 vaccine status: Information provided on how to obtain vaccines.   Qualifies for Shingles Vaccine? Yes   Zostavax completed No   {Shingrix Completed?:2101804}  Screening Tests Health Maintenance  Topic Date Due   COVID-19 Vaccine (3 - Pfizer risk series) 09/08/2019   Zoster Vaccines- Shingrix (2 of 2) 11/01/2022   INFLUENZA VACCINE  12/20/2022   Medicare Annual Wellness (AWV)  02/13/2023   Colonoscopy  09/06/2027   Pneumonia Vaccine 32+ Years old  Completed   Hepatitis C Screening  Completed   HPV VACCINES  Aged Out   DTaP/Tdap/Td  Discontinued    Health Maintenance  Health Maintenance Due  Topic Date Due   COVID-19 Vaccine (3 - Pfizer risk series) 09/08/2019  Zoster Vaccines- Shingrix (2 of 2) 11/01/2022   INFLUENZA VACCINE  12/20/2022   Medicare Annual Wellness (AWV)  02/13/2023    Colorectal cancer screening: Type of screening: Colonoscopy. Completed 09/06/22. Repeat every 5 years  Lung Cancer Screening: (Low Dose CT Chest recommended if Age 42-80 years, 20 pack-year currently smoking OR have quit w/in 15years.) does not qualify.   Lung Cancer Screening Referral: n/a  Additional  Screening:  Hepatitis C Screening: does qualify; Completed 09/14/20  Vision Screening: Recommended annual ophthalmology exams for early detection of glaucoma and other disorders of the eye. Is the patient up to date with their annual eye exam?  Yes  Who is the provider or what is the name of the office in which the patient attends annual eye exams? Dr. Jimmye Norman If pt is not established with a provider, would they like to be referred to a provider to establish care? No .   Dental Screening: Recommended annual dental exams for proper oral hygiene  Community Resource Referral / Chronic Care Management: CRR required this visit?  No   CCM required this visit?  No     Plan:     I have personally reviewed and noted the following in the patient's chart:   Medical and social history Use of alcohol, tobacco or illicit drugs  Current medications and supplements including opioid prescriptions. Patient is not currently taking opioid prescriptions. Functional ability and status Nutritional status Physical activity Advanced directives List of other physicians Hospitalizations, surgeries, and ER visits in previous 12 months Vitals Screenings to include cognitive, depression, and falls Referrals and appointments  In addition, I have reviewed and discussed with patient certain preventive protocols, quality metrics, and best practice recommendations. A written personalized care plan for preventive services as well as general preventive health recommendations were provided to patient.     Kandis Fantasia [place], [place]   1/61/0960   After Visit Summary: (In Person-Printed) AVS printed and given to the patient  Nurse Notes: ***

## 2023-02-14 ENCOUNTER — Encounter: Payer: Self-pay | Admitting: Family Medicine

## 2023-02-14 ENCOUNTER — Ambulatory Visit: Payer: Medicare Other | Admitting: Family Medicine

## 2023-02-14 VITALS — BP 136/74 | HR 80 | Temp 97.5°F | Ht 71.0 in | Wt 222.2 lb

## 2023-02-14 DIAGNOSIS — I639 Cerebral infarction, unspecified: Secondary | ICD-10-CM | POA: Diagnosis not present

## 2023-02-14 DIAGNOSIS — Z23 Encounter for immunization: Secondary | ICD-10-CM

## 2023-02-14 DIAGNOSIS — E7800 Pure hypercholesterolemia, unspecified: Secondary | ICD-10-CM

## 2023-02-14 DIAGNOSIS — I10 Essential (primary) hypertension: Secondary | ICD-10-CM

## 2023-02-14 DIAGNOSIS — Z0000 Encounter for general adult medical examination without abnormal findings: Secondary | ICD-10-CM

## 2023-02-14 DIAGNOSIS — Z0001 Encounter for general adult medical examination with abnormal findings: Secondary | ICD-10-CM

## 2023-02-14 DIAGNOSIS — R0989 Other specified symptoms and signs involving the circulatory and respiratory systems: Secondary | ICD-10-CM

## 2023-02-14 NOTE — Addendum Note (Signed)
Addended by: Venia Carbon K on: 02/14/2023 11:33 AM   Modules accepted: Orders

## 2023-02-14 NOTE — Progress Notes (Signed)
Subjective:    Patient ID: Chad Serrano, male    DOB: 15-May-1946, 77 y.o.   MRN: [id number] Patient is here to explore exam.  His blood pressure today is very good.  He is surprised by this.  He was very upset this morning over an error in scheduling regarding Medicare wellness visit.  The only concern he has is a chronic cough.  I have seen him for this in the past and treating with Xyzal, Flonase, and a proton pump inhibitor.  He has no history of asthma.  Now this bothers him on a daily basis.  He would like to see ENT.  His PSA was excellent.  His colonoscopy was last in 2019.  GI has reached out to him earlier this year to schedule the colonoscopy.  This is pending.  He was also frustrated that apparently our office failed to schedule this for him.  I apologize for both of these occurrences.  He is due for a flu shot.  He adamantly refuses the COVID shot.  He declines RSV shot.  He has had 1 dose of the shingles vaccine.  He is due for the second.  His most recent lab work is listed below Lab on 02/11/2023  Component Date Value Ref Range Status   WBC 02/11/2023 8.4  3.8 - 10.8 Thousand/uL Final   RBC 02/11/2023 4.51  4.20 - 5.80 Million/uL Final   Hemoglobin 02/11/2023 14.8  13.2 - 17.1 g/dL Final   HCT 82/95/6213 44.3  38.5 - 50.0 % Final   MCV 02/11/2023 98.2  80.0 - 100.0 fL Final   MCH 02/11/2023 32.8  27.0 - 33.0 pg Final   MCHC 02/11/2023 33.4  32.0 - 36.0 g/dL Final   RDW 08/65/7846 13.1  11.0 - 15.0 % Final   Platelets 02/11/2023 172  140 - 400 Thousand/uL Final   MPV 02/11/2023 11.5  7.5 - 12.5 fL Final   Neutro Abs 02/11/2023 3,646  1,500 - 7,800 cells/uL Final   Lymphs Abs 02/11/2023 3,814  850 - 3,900 cells/uL Final   Absolute Monocytes 02/11/2023 815  200 - 950 cells/uL Final   Eosinophils Absolute 02/11/2023 92  15 - 500 cells/uL Final   Basophils Absolute 02/11/2023 34  0 - 200 cells/uL Final   Neutrophils Relative % 02/11/2023 43.4  % Final   Total Lymphocyte  02/11/2023 45.4  % Final   Monocytes Relative 02/11/2023 9.7  % Final   Eosinophils Relative 02/11/2023 1.1  % Final   Basophils Relative 02/11/2023 0.4  % Final   Glucose, Bld 02/11/2023 83  65 - 99 mg/dL Final   Comment: .            Fasting reference interval .    BUN 02/11/2023 21  7 - 25 mg/dL Final   Creat 96/29/5284 1.06  0.70 - 1.28 mg/dL Final   eGFR 13/24/4010 72  > OR = 60 mL/min/1.62m2 Final   BUN/Creatinine Ratio 02/11/2023 SEE NOTE:  6 - 22 (calc) Final   Comment:    Not Reported: BUN and Creatinine are within    reference range. .    Sodium 02/11/2023 138  135 - 146 mmol/L Final   Potassium 02/11/2023 4.2  3.5 - 5.3 mmol/L Final   Chloride 02/11/2023 103  98 - 110 mmol/L Final   CO2 02/11/2023 26  20 - 32 mmol/L Final   Calcium 02/11/2023 8.6  8.6 - 10.3 mg/dL Final   Total Protein 27/25/3664 5.9 (L)  6.1 -  8.1 g/dL Final   Albumin 46/96/2952 3.8  3.6 - 5.1 g/dL Final   Globulin 84/13/2440 2.1  1.9 - 3.7 g/dL (calc) Final   AG Ratio 02/11/2023 1.8  1.0 - 2.5 (calc) Final   Total Bilirubin 02/11/2023 1.6 (H)  0.2 - 1.2 mg/dL Final   Alkaline phosphatase (APISO) 02/11/2023 71  35 - 144 U/L Final   AST 02/11/2023 17  10 - 35 U/L Final   ALT 02/11/2023 22  9 - 46 U/L Final   Cholesterol 02/11/2023 130  <200 mg/dL Final   HDL 03/17/2535 52  > OR = 40 mg/dL Final   Triglycerides 64/40/3474 73  <150 mg/dL Final   LDL Cholesterol (Calc) 02/11/2023 63  mg/dL (calc) Final   Comment: Reference range: <100 . Desirable range <100 mg/dL for primary prevention;   <70 mg/dL for patients with CHD or diabetic patients  with > or = 2 CHD risk factors. Marland Kitchen LDL-C is now calculated using the Martin-Hopkins  calculation, which is a validated novel method providing  better accuracy than the Friedewald equation in the  estimation of LDL-C.  Horald Pollen et al. Lenox Ahr. 2595;638(75): 2061-2068  (http://education.[facility].com/faq/FAQ164)    Total CHOL/HDL Ratio 02/11/2023 2.5  <6.4  (calc) Final   Non-HDL Cholesterol (Calc) 02/11/2023 78  <130 mg/dL (calc) Final   Comment: For patients with diabetes plus 1 major ASCVD risk  factor, treating to a non-HDL-C goal of <100 mg/dL  (LDL-C of <33 mg/dL) is considered a therapeutic  option.    PSA 02/11/2023 0.58  < OR = 4.00 ng/mL Final   Comment: The total PSA value from this assay system is  standardized against the WHO standard. The test  result will be approximately 20% lower when compared  to the equimolar-standardized total PSA (Beckman  Coulter). Comparison of serial PSA results should be  interpreted with this fact in mind. . This test was performed using the Siemens  chemiluminescent method. Values obtained from  different assay methods cannot be used interchangeably. PSA levels, regardless of value, should not be interpreted as absolute evidence of the presence or absence of disease.     Past Medical History:  Diagnosis Date   Anxiety    Chronic kidney disease    Gout    Hyperlipidemia    Hypertension    Hypogonadism male    Hypogonadism male    Melanoma (HCC)    at T4 surgically excised at [facility] (2020) free margins and sentinel node biopsy negative   Melanoma (HCC)    Stroke (HCC)    cryptogenic, right retinal artery occlusion   Past Surgical History:  Procedure Laterality Date   APPENDECTOMY     CHOLECYSTECTOMY  06/14/2020   COLONOSCOPY     GALLBLADDER SURGERY  05/2020   ROTATOR CUFF REPAIR Right    Current Outpatient Medications on File Prior to Visit  Medication Sig Dispense Refill   allopurinol (ZYLOPRIM) 100 MG tablet Take 2 tablets (200 mg total) by mouth daily. 180 tablet 3   Ascorbic Acid (VITAMIN C) 1000 MG tablet Take 1,000 mg by mouth daily.     atorvastatin (LIPITOR) 40 MG tablet TAKE 1 TABLET BY MOUTH DAILY AT 6 PM 90 tablet 0   Cholecalciferol (VITAMIN D3) 2000 UNITS TABS Take 2,000 Units by mouth daily.      clopidogrel (PLAVIX) 75 MG tablet TAKE 1 TABLET BY MOUTH EVERY DAY 90  tablet 1   desoximetasone (TOPICORT) 0.25 % cream APPLY SPARINGLY TO AFFECTED AREA TWICE  A DAY     L-Arginine 500 MG TABS Take 500 mg by mouth daily.      losartan (COZAAR) 100 MG tablet TAKE 1 TABLET BY MOUTH EVERY DAY 90 tablet 2   Magnesium 500 MG CAPS Take 500 mg by mouth daily.      Omega-3 Fatty Acids (FISH OIL) 1000 MG CAPS Take 2,000 mg by mouth daily.      tiZANidine (ZANAFLEX) 4 MG tablet Take 1 tablet (4 mg total) by mouth every 6 (six) hours as needed for muscle spasms. 90 tablet 0   traMADol (ULTRAM) 50 MG tablet Take 50 mg by mouth every 12 (twelve) hours as needed for moderate pain.     vitamin B-12 (CYANOCOBALAMIN) 1000 MCG tablet Take 1,000 mcg by mouth daily.      Zinc 50 MG TABS Take 50 mg by mouth daily.      No current facility-administered medications on file prior to visit.   No Known Allergies Social History   Socioeconomic History   Marital status: Married    Spouse name: Not on file   Number of children: Not on file   Years of education: Not on file   Highest education level: Bachelor's degree (e.g., BA, AB, BS)  Occupational History   Not on file  Tobacco Use   Smoking status: Never   Smokeless tobacco: Never  Substance and Sexual Activity   Alcohol use: No   Drug use: No   Sexual activity: Not Currently    Birth control/protection: None    Comment: married to [employer].  Retired.  Weightlifter  Other Topics Concern   Not on file  Social History Narrative   Not on file   Social Determinants of Health   Financial Resource Strain: Low Risk  (09/05/2022)   Overall Financial Resource Strain (CARDIA)    Difficulty of Paying Living Expenses: Not hard at all  Food Insecurity: No Food Insecurity (09/05/2022)   Hunger Vital Sign    Worried About Running Out of Food in the Last Year: Never true    Ran Out of Food in the Last Year: Never true  Transportation Needs: No Transportation Needs (09/05/2022)   PRAPARE - Administrator, Civil Service  (Medical): No    Lack of Transportation (Non-Medical): No  Physical Activity: Sufficiently Active (09/05/2022)   Exercise Vital Sign    Days of Exercise per Week: 3 days    Minutes of Exercise per Session: 90 min  Stress: No Stress Concern Present (09/05/2022)   [employer] of Occupational Health - Occupational Stress Questionnaire    Feeling of Stress : Only a little  Social Connections: Socially Integrated (09/05/2022)   Social Connection and Isolation Panel [NHANES]    Frequency of Communication with Friends and Family: Three times a week    Frequency of Social Gatherings with Friends and Family: Three times a week    Attends Religious Services: More than 4 times per year    Active Member of Clubs or Organizations: Yes    Attends Banker Meetings: More than 4 times per year    Marital Status: Married  Catering manager Violence: Not At Risk (09/22/2021)   Humiliation, Afraid, Rape, and Kick questionnaire    Fear of Current or Ex-Partner: No    Emotionally Abused: No    Physically Abused: No    Sexually Abused: No   Family History  Problem Relation Age of Onset   Colon polyps Father    Cancer  Mother    Colon cancer Neg Hx    Esophageal cancer Neg Hx    Rectal cancer Neg Hx    Stomach cancer Neg Hx       Review of Systems  All other systems reviewed and are negative.      Objective:   Physical Exam Vitals reviewed.  Constitutional:      General: He is not in acute distress.    Appearance: Normal appearance. He is not ill-appearing, toxic-appearing or diaphoretic.  HENT:     Head: Normocephalic and atraumatic.     Right Ear: Tympanic membrane and ear canal normal. There is no impacted cerumen.     Left Ear: Tympanic membrane and ear canal normal. There is no impacted cerumen.     Nose: No congestion or rhinorrhea.     Mouth/Throat:     Mouth: Mucous membranes are moist.     Pharynx: Oropharynx is clear. No oropharyngeal exudate or posterior  oropharyngeal erythema.  Eyes:     General: No scleral icterus.       Right eye: No discharge.        Left eye: No discharge.     Extraocular Movements: Extraocular movements intact.     Conjunctiva/sclera: Conjunctivae normal.     Pupils: Pupils are equal, round, and reactive to light.  Neck:     Vascular: No carotid bruit.  Cardiovascular:     Rate and Rhythm: Normal rate and regular rhythm.     Heart sounds: No murmur heard.    No friction rub. No gallop.  Pulmonary:     Effort: Pulmonary effort is normal. No respiratory distress.     Breath sounds: Normal breath sounds. No stridor. No wheezing, rhonchi or rales.  Chest:     Chest wall: No tenderness.  Abdominal:     General: Abdomen is flat. Bowel sounds are normal. There is no distension.     Palpations: Abdomen is soft.     Tenderness: There is no abdominal tenderness. There is no right CVA tenderness, left CVA tenderness, guarding or rebound.     Hernia: No hernia is present.  Musculoskeletal:     Cervical back: Normal range of motion and neck supple. No rigidity or tenderness.     Right lower leg: No edema.     Left lower leg: No edema.  Lymphadenopathy:     Cervical: No cervical adenopathy.  Skin:    General: Skin is warm.     Coloration: Skin is not jaundiced or pale.     Findings: No bruising, erythema, lesion or rash.  Neurological:     General: No focal deficit present.     Mental Status: He is alert and oriented to person, place, and time. Mental status is at baseline.     Cranial Nerves: No cranial nerve deficit.     Sensory: No sensory deficit.     Motor: No weakness.     Coordination: Coordination normal.     Gait: Gait normal.     Deep Tendon Reflexes: Reflexes normal.  Psychiatric:        Mood and Affect: Mood normal.        Behavior: Behavior normal.        Thought Content: Thought content normal.        Judgment: Judgment normal.       Assessment & Plan:  Chronic throat clearing - Plan:  Ambulatory referral to ENT  Pure hypercholesterolemia  Cryptogenic stroke [facility])  Primary hypertension  Encounter for Medicare annual wellness exam Patient cholesterol outstanding.  PSA is in normal range.  Blood pressure is excellent.  Patient received his flu shot today.  Recommend that he get the second dose of the shingles vaccine.  The patient declines COVID and RSV.  GI is scheduling the patient for colonoscopy.  His other lab work looks outstanding.  I suspect acid reflux is the most likely cause of his chronic cough and throat clearing however I will consult ENT for laryngoscopy as the primary symptom at this point is a sensation or tickle in his throat that he has to clear with coughing.  He states that proton pump inhibitor did not help

## 2023-02-17 ENCOUNTER — Other Ambulatory Visit: Payer: Self-pay | Admitting: Family Medicine

## 2023-02-18 NOTE — Telephone Encounter (Signed)
Requested medication (s) are due for refill today: yes  Requested medication (s) are on the active medication list: yes  Last refill:  tizanidine: 01/25/23 #90    allopurinol: 12/28/21 #180 3 RF  Future visit scheduled: no  Notes to clinic:  no uric acid level/ tizanidine: not delegated to NT to RF   Requested Prescriptions  Pending Prescriptions Disp Refills   tiZANidine (ZANAFLEX) 4 MG tablet [Pharmacy Med Name: TIZANIDINE HCL 4 MG TABLET] 90 tablet 0    Sig: TAKE 1 TABLET BY MOUTH EVERY 6 HOURS AS NEEDED FOR MUSCLE SPASMS.     Not Delegated - Cardiovascular:  Alpha-2 Agonists - tizanidine Failed - 02/17/2023  2:30 AM      Failed - This refill cannot be delegated      Failed - Valid encounter within last 6 months    Recent Outpatient Visits           1 year ago Chronic cough   [facility] Tanya Nones, Priscille Heidelberg, MD   1 year ago Excessive cerumen in left ear canal   [facility] Valentino Nose, NP   2 years ago Left flank pain, chronic   [facility] Tanya Nones, Priscille Heidelberg, MD   2 years ago Cryptogenic stroke [facility])   [facility] Tanya Nones, Priscille Heidelberg, MD   2 years ago Wound of left cheek, initial encounter   [facility] Cathlean Marseilles A, NP               allopurinol (ZYLOPRIM) 100 MG tablet [Pharmacy Med Name: ALLOPURINOL 100 MG TABLET] 180 tablet 3    Sig: TAKE 2 TABLETS BY MOUTH EVERY DAY     Endocrinology:  Gout Agents - allopurinol Failed - 02/17/2023  2:30 AM      Failed - Uric Acid in normal range and within 360 days    No results found for: "POCURA", "LABURIC"       Failed - Valid encounter within last 12 months    Recent Outpatient Visits           1 year ago Chronic cough   [facility] Tanya Nones, Priscille Heidelberg, MD   1 year ago Excessive cerumen in left ear canal   [facility] Valentino Nose, NP   2 years ago Left flank pain, chronic   [facility]  Family Medicine Tanya Nones, Priscille Heidelberg, MD   2 years ago Cryptogenic stroke [facility])   [facility] Tanya Nones, Priscille Heidelberg, MD   2 years ago Wound of left cheek, initial encounter   [facility] Valentino Nose, NP              Passed - Cr in normal range and within 360 days    Creat  Date Value Ref Range Status  02/11/2023 1.06 0.70 - 1.28 mg/dL Final         Passed - CBC within normal limits and completed in the last 12 months    WBC  Date Value Ref Range Status  02/11/2023 8.4 3.8 - 10.8 Thousand/uL Final   RBC  Date Value Ref Range Status  02/11/2023 4.51 4.20 - 5.80 Million/uL Final   Hemoglobin  Date Value Ref Range Status  02/11/2023 14.8 13.2 - 17.1 g/dL Final   HCT  Date Value Ref Range Status  02/11/2023 44.3 38.5 - 50.0 % Final   MCHC  Date Value Ref Range Status  02/11/2023 33.4 32.0 - 36.0 g/dL Final   [facility]  Date Value Ref Range Status  02/11/2023 32.8 27.0 - 33.0 pg Final   MCV  Date Value Ref Range Status  02/11/2023 98.2 80.0 - 100.0 fL Final   No results found for: "PLTCOUNTKUC", "LABPLAT", "POCPLA" RDW  Date Value Ref Range Status  02/11/2023 13.1 11.0 - 15.0 % Final

## 2023-02-25 DIAGNOSIS — H2512 Age-related nuclear cataract, left eye: Secondary | ICD-10-CM | POA: Diagnosis not present

## 2023-02-27 ENCOUNTER — Other Ambulatory Visit: Payer: Self-pay | Admitting: Family Medicine

## 2023-02-27 NOTE — Telephone Encounter (Signed)
Last OV 02/14/23 Requested Prescriptions  Pending Prescriptions Disp Refills   clopidogrel (PLAVIX) 75 MG tablet [Pharmacy Med Name: CLOPIDOGREL 75 MG TABLET] 90 tablet 1    Sig: TAKE 1 TABLET BY MOUTH EVERY DAY     Hematology: Antiplatelets - clopidogrel Failed - 02/27/2023  1:12 AM      Failed - Valid encounter within last 6 months    Recent Outpatient Visits           1 year ago Chronic cough   [facility] Pickard, Priscille Heidelberg, MD   1 year ago Excessive cerumen in left ear canal   [facility] Valentino Nose, NP   2 years ago Left flank pain, chronic   [facility] Donita Brooks, MD   2 years ago Cryptogenic stroke [facility])   [facility] Donita Brooks, MD   2 years ago Wound of left cheek, initial encounter   [facility] Valentino Nose, NP              Passed - HCT in normal range and within 180 days    HCT  Date Value Ref Range Status  02/11/2023 44.3 38.5 - 50.0 % Final         Passed - HGB in normal range and within 180 days    Hemoglobin  Date Value Ref Range Status  02/11/2023 14.8 13.2 - 17.1 g/dL Final         Passed - PLT in normal range and within 180 days    Platelets  Date Value Ref Range Status  02/11/2023 172 140 - 400 Thousand/uL Final         Passed - Cr in normal range and within 360 days    Creat  Date Value Ref Range Status  02/11/2023 1.06 0.70 - 1.28 mg/dL Final

## 2023-03-08 ENCOUNTER — Ambulatory Visit: Payer: Medicare Other

## 2023-03-08 NOTE — Progress Notes (Signed)
  Pt came in to due a skin tear. Per pt stated that he took off a Band-Aid and as he was pulling the band-aid off, it also wripped the skin along with it. Pt have an old band-aid from earlier.  Took off old bandage, clean it saline, peroxide. Then clean it with some betadine. Soak a few 2x2's gauz with betadine and put over the skin tear site. It looks like its no longer bleeding. Then cover the site with mepilex foam dressing. Adding slight pressure by wrapping with 2inc coban.    Told pt to wait until this evening to take the dressing off. Also told pt that it continues to bleed for him to go UC since today was Friday and we are not open tom.   Pt voiced understanding and nothing further.

## 2023-03-09 ENCOUNTER — Encounter (HOSPITAL_COMMUNITY): Payer: Self-pay

## 2023-03-09 ENCOUNTER — Ambulatory Visit (HOSPITAL_COMMUNITY)
Admission: EM | Admit: 2023-03-09 | Discharge: 2023-03-09 | Disposition: A | Discharge status: Home / Self Care | Payer: Medicare Other | Attending: Family Medicine | Admitting: Family Medicine

## 2023-03-09 DIAGNOSIS — R58 Hemorrhage, not elsewhere classified: Secondary | ICD-10-CM

## 2023-03-09 DIAGNOSIS — T148XXA Other injury of unspecified body region, initial encounter: Secondary | ICD-10-CM | POA: Diagnosis not present

## 2023-03-09 NOTE — ED Triage Notes (Signed)
Patient here today with c/o an open wound on right upper arm that started on Wednesday. Patient had a small dry spot on his upper arm that he scratched and it started bleeding. Patient placed a band aid on it and 2 days later, he removed the band aid and it pulled some of his skin off and now has a larger wound. Patient is on blood thinners. His wife states that this morning when they woke up, he had blood all over the bed and "it looked like someone was stabbed".

## 2023-03-10 ENCOUNTER — Encounter (HOSPITAL_COMMUNITY): Payer: Self-pay

## 2023-03-10 ENCOUNTER — Ambulatory Visit (HOSPITAL_COMMUNITY)
Admission: EM | Admit: 2023-03-10 | Discharge: 2023-03-10 | Disposition: A | Discharge status: Home / Self Care | Payer: Medicare Other | Attending: Internal Medicine | Admitting: Internal Medicine

## 2023-03-10 DIAGNOSIS — S41111D Laceration without foreign body of right upper arm, subsequent encounter: Secondary | ICD-10-CM

## 2023-03-10 DIAGNOSIS — Z5189 Encounter for other specified aftercare: Secondary | ICD-10-CM | POA: Diagnosis not present

## 2023-03-10 NOTE — Discharge Instructions (Signed)
Keep the bandage on for 48 hours then gently removed.  Return for bleeding or concerns for infection which include redness, swelling, pus fever

## 2023-03-10 NOTE — ED Triage Notes (Addendum)
Pt seen yesterday for wound on right upper arm that wouldn't stop bleeding. Patient stated he was told to come back today if still bleeding to have it cauterized. Bleeding controlled in triage.

## 2023-03-10 NOTE — ED Provider Notes (Signed)
[facility]    CSN: [id number] Arrival date & time: 03/10/23  1038      History   Chief Complaint Chief Complaint  Patient presents with   Wound Check    HPI Chad Serrano is a 77 y.o. male.    Wound Check  Here for wound check.  Seen yesterday for bleeding wound upper arm.  States had a small wound right arm being and the bigger wound.  He was seen here yesterday for bleeding had a bandage applied was told to return today.  Admits bleeding at home.  Denies fever, chills.  He does take Plavix.  Past Medical History:  Diagnosis Date   Anxiety    Chronic kidney disease    Gout    Hyperlipidemia    Hypertension    Hypogonadism male    Hypogonadism male    Melanoma (HCC)    at T4 surgically excised at [facility] (2020) free margins and sentinel node biopsy negative   Melanoma (HCC)    Stroke (HCC)    cryptogenic, right retinal artery occlusion    Patient Active Problem List   Diagnosis Date Noted   Stroke [facility])    Loss of vision 05/26/2019   Cryptogenic stroke (HCC) 05/26/2019   Retinal artery occlusion 05/25/2019   Melanoma (HCC)    Pulmonary infiltrates 10/02/2018   Spinal stenosis of lumbar region 03/29/2015   Spondylolisthesis 03/29/2015   Hypogonadism male 08/05/2012   Other and unspecified hyperlipidemia 08/05/2012   Hypertension    Gout     Past Surgical History:  Procedure Laterality Date   APPENDECTOMY     CHOLECYSTECTOMY  06/14/2020   COLONOSCOPY     GALLBLADDER SURGERY  05/2020   ROTATOR CUFF REPAIR Right        Home Medications    Prior to Admission medications   Medication Sig Start Date End Date Taking? Authorizing Provider  allopurinol (ZYLOPRIM) 100 MG tablet Take 2 tablets (200 mg total) by mouth daily. 12/28/21   Donita Brooks, MD  Ascorbic Acid (VITAMIN C) 1000 MG tablet Take 1,000 mg by mouth daily.    [provider]  atorvastatin (LIPITOR) 40 MG tablet TAKE 1 TABLET BY MOUTH DAILY AT 6 PM 01/11/23   Donita Brooks, MD  Cholecalciferol (VITAMIN D3) 2000 UNITS TABS Take 2,000 Units by mouth daily.     [provider]  clopidogrel (PLAVIX) 75 MG tablet TAKE 1 TABLET BY MOUTH EVERY DAY 02/27/23   Donita Brooks, MD  desoximetasone (TOPICORT) 0.25 % cream APPLY SPARINGLY TO AFFECTED AREA TWICE A DAY 05/31/21   [provider]  L-Arginine 500 MG TABS Take 500 mg by mouth daily.     [provider]  losartan (COZAAR) 100 MG tablet TAKE 1 TABLET BY MOUTH EVERY DAY 07/26/22   Donita Brooks, MD  Magnesium 500 MG CAPS Take 500 mg by mouth daily.     [provider]  Omega-3 Fatty Acids (FISH OIL) 1000 MG CAPS Take 2,000 mg by mouth daily.     [provider]  tiZANidine (ZANAFLEX) 4 MG tablet Take 1 tablet (4 mg total) by mouth every 6 (six) hours as needed for muscle spasms. 01/25/23   Donita Brooks, MD  traMADol (ULTRAM) 50 MG tablet Take 50 mg by mouth every 12 (twelve) hours as needed for moderate pain.    [provider]  vitamin B-12 (CYANOCOBALAMIN) 1000 MCG tablet Take 1,000 mcg by mouth daily.  [provider]  Zinc 50 MG TABS Take 50 mg by mouth daily.     [provider]    Family History Family History  Problem Relation Age of Onset   Colon polyps Father    Cancer Mother    Colon cancer Neg Hx    Esophageal cancer Neg Hx    Rectal cancer Neg Hx    Stomach cancer Neg Hx     Social History Social History   Tobacco Use   Smoking status: Never   Smokeless tobacco: Never  Substance Use Topics   Alcohol use: No   Drug use: No     Allergies   Patient has no known allergies.   Review of Systems Review of Systems   Physical Exam Triage Vital Signs ED Triage Vitals  Encounter Vitals Group     BP 03/10/23 1111 (!) 168/80     Systolic BP Percentile --      Diastolic BP Percentile --      Pulse Rate 03/10/23 1111 92     Resp 03/10/23 1111 16     Temp 03/10/23 1111 98.2 F (36.8 C)     Temp  Source 03/10/23 1111 Oral     SpO2 03/10/23 1111 96 %     Weight --      Height --      Head Circumference --      Peak Flow --      Pain Score 03/10/23 1112 5     Pain Loc --      Pain Education --      Exclude from Growth Chart --    No data found.  Updated Vital Signs BP (!) 168/80 (BP Location: Left Arm)   Pulse 92   Temp 98.2 F (36.8 C) (Oral)   Resp 16   SpO2 96%   Visual Acuity Right Eye Distance:   Left Eye Distance:   Bilateral Distance:    Right Eye Near:   Left Eye Near:    Bilateral Near:     Physical Exam Vitals reviewed.  Constitutional:      Appearance: Normal appearance.  Skin:    General: Skin is warm.     Comments: 1 x 2 cm skin tear right upper arm no active bleeding, there is some local bruising.  No erythema, no induration, no purulent drainage  Neurological:     Mental Status: He is alert.      UC Treatments / Results  Labs (all labs ordered are listed, but only abnormal results are displayed) Labs Reviewed - No data to display  EKG   Radiology No results found.  Procedures Procedures (including critical care time)  Medications Ordered in UC Medications - No data to display  Initial Impression / Assessment and Plan / UC Course  I have reviewed the triage vital signs and the nursing notes.  Pertinent labs & imaging results that were available during my care of the patient were reviewed by me and considered in my medical decision making (see chart for details).     77 year old on Plavix here for wound check bleeding wound right arm. Wound is not bleeding at this time, bandage was removed, wound cleaned, Xeroform gauze dressing applied.  Recommend leaving bandage on for 48 hours then remove gently.  Return for any concerns.  Signs of infection reviewed Final Clinical Impressions(s) / UC Diagnoses   Final diagnoses:  Visit for wound check  Skin tear of right upper arm without complication,  subsequent encounter      Discharge Instructions      Keep the bandage on for 48 hours then gently removed.  Return for bleeding or concerns for infection which include redness, swelling, pus fever   ED Prescriptions   None    PDMP not reviewed this encounter.   Meliton Rattan, [place] 03/10/23 1124

## 2023-03-11 ENCOUNTER — Encounter (INDEPENDENT_AMBULATORY_CARE_PROVIDER_SITE_OTHER): Payer: Self-pay | Admitting: Otolaryngology

## 2023-03-11 NOTE — ED Provider Notes (Signed)
[facility]   [id number] 03/09/23 Arrival Time: 1024  ASSESSMENT & PLAN:  1. Bleeding   2. Wound of skin    R upper arm. Wound cleaned. Surgifoam applied under non-adherent pad. Return tomorrow for wound check. ED if needed.   Follow-up Information     [facility].   Specialty: Urgent Care Why: If worsening or failing to improve as anticipated. Contact information: [address] [place] [address] [phone]        Donita Brooks, MD.   Specialty: Family Medicine Why: As needed. Contact information: [address] [place] [address] [phone]                 Reviewed expectations re: course of current medical issues. Questions answered. Outlined signs and symptoms indicating need for more acute intervention. Patient verbalized understanding. After Visit Summary given.   SUBJECTIVE: History from: patient. Chad Serrano is a 77 y.o. male who reports skin tear to R upper arm; oozing; cannot get to stop. Started bleeding after scratching area. No oral/gum bleeding. On Plavix.  OBJECTIVE:  Vitals:   03/09/23 1057 03/09/23 1101  BP:  (!) 142/96  Pulse:  90  Resp:  16  Temp:  98.3 F (36.8 C)  TempSrc:  Oral  SpO2:  94%  Weight: 97.5 kg   Height: 5\' 11"  (1.803 m)     General appearance: alert; no distress Skin: warm and dry; sub cm skin tear of upper R arm; oozing blood; no signs of infection Psychological: alert and cooperative; normal mood and affect   No Known Allergies  Past Medical History:  Diagnosis Date   Anxiety    Chronic kidney disease    Gout    Hyperlipidemia    Hypertension    Hypogonadism male    Hypogonadism male    Melanoma (HCC)    at T4 surgically excised at [facility] (2020) free margins and sentinel node biopsy negative   Melanoma (HCC)    Stroke (HCC)    cryptogenic, right retinal artery occlusion   Social History   Socioeconomic History    Marital status: Married    Spouse name: Not on file   Number of children: Not on file   Years of education: Not on file   Highest education level: Bachelor's degree (e.g., BA, AB, BS)  Occupational History   Not on file  Tobacco Use   Smoking status: Never   Smokeless tobacco: Never  Substance and Sexual Activity   Alcohol use: No   Drug use: No   Sexual activity: Not Currently    Birth control/protection: None    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  Other Topics Concern   Not on file  Social History Narrative   Not on file   Social Determinants of Health   Financial Resource Strain: Low Risk  (09/05/2022)   Overall Financial Resource Strain (CARDIA)    Difficulty of Paying Living Expenses: Not hard at all  Food Insecurity: No Food Insecurity (09/05/2022)   Hunger Vital Sign    Worried About Running Out of Food in the Last Year: Never true    Ran Out of Food in the Last Year: Never true  Transportation Needs: No Transportation Needs (09/05/2022)   PRAPARE - Administrator, Civil Service (Medical): No    Lack of Transportation (Non-Medical): No  Physical Activity: Sufficiently Active (09/05/2022)   Exercise Vital Sign  Days of Exercise per Week: 3 days    Minutes of Exercise per Session: 90 min  Stress: No Stress Concern Present (09/05/2022)   [employer] of Occupational Health - Occupational Stress Questionnaire    Feeling of Stress : Only a little  Social Connections: Socially Integrated (09/05/2022)   Social Connection and Isolation Panel [NHANES]    Frequency of Communication with Friends and Family: Three times a week    Frequency of Social Gatherings with Friends and Family: Three times a week    Attends Religious Services: More than 4 times per year    Active Member of Clubs or Organizations: Yes    Attends Banker Meetings: More than 4 times per year    Marital Status: Married  Catering manager Violence: Not At Risk  (09/22/2021)   Humiliation, Afraid, Rape, and Kick questionnaire    Fear of Current or Ex-Partner: No    Emotionally Abused: No    Physically Abused: No    Sexually Abused: No   Family History  Problem Relation Age of Onset   Colon polyps Father    Cancer Mother    Colon cancer Neg Hx    Esophageal cancer Neg Hx    Rectal cancer Neg Hx    Stomach cancer Neg Hx    Past Surgical History:  Procedure Laterality Date   APPENDECTOMY     CHOLECYSTECTOMY  06/14/2020   COLONOSCOPY     GALLBLADDER SURGERY  05/2020   ROTATOR CUFF REPAIR Right      Mardella Layman, MD 03/11/23 [phone]

## 2023-03-12 ENCOUNTER — Ambulatory Visit: Payer: Medicare Other | Admitting: Family Medicine

## 2023-03-12 VITALS — BP 132/84 | HR 78 | Ht 71.0 in | Wt 222.0 lb

## 2023-03-12 DIAGNOSIS — S41111A Laceration without foreign body of right upper arm, initial encounter: Secondary | ICD-10-CM | POA: Diagnosis not present

## 2023-03-12 DIAGNOSIS — S41111S Laceration without foreign body of right upper arm, sequela: Secondary | ICD-10-CM

## 2023-03-12 NOTE — Progress Notes (Signed)
Subjective:    Patient ID: Chad Serrano, male    DOB: 30-Aug-1945, 77 y.o.   MRN: [id number]  HPI Patient presents today with a bleeding skin tear.  The patient suffered a skin tear on Thursday to his lateral right shoulder.  Is roughly 2-1/2 cm x 2 cm.  It is oozing blood.  Every time he takes off the dressing and see oozing blood.  There is no evidence of cellulitis.  He has been to the urgent care twice but when he removes his dressing it starts to bleed again. Past Medical History:  Diagnosis Date   Anxiety    Chronic kidney disease    Gout    Hyperlipidemia    Hypertension    Hypogonadism male    Hypogonadism male    Melanoma (HCC)    at T4 surgically excised at [facility] (2020) free margins and sentinel node biopsy negative   Melanoma (HCC)    Stroke (HCC)    cryptogenic, right retinal artery occlusion   Past Surgical History:  Procedure Laterality Date   APPENDECTOMY     CHOLECYSTECTOMY  06/14/2020   COLONOSCOPY     GALLBLADDER SURGERY  05/2020   ROTATOR CUFF REPAIR Right    Current Outpatient Medications on File Prior to Visit  Medication Sig Dispense Refill   allopurinol (ZYLOPRIM) 100 MG tablet Take 2 tablets (200 mg total) by mouth daily. 180 tablet 3   Ascorbic Acid (VITAMIN C) 1000 MG tablet Take 1,000 mg by mouth daily.     atorvastatin (LIPITOR) 40 MG tablet TAKE 1 TABLET BY MOUTH DAILY AT 6 PM 90 tablet 0   Cholecalciferol (VITAMIN D3) 2000 UNITS TABS Take 2,000 Units by mouth daily.      clopidogrel (PLAVIX) 75 MG tablet TAKE 1 TABLET BY MOUTH EVERY DAY 90 tablet 1   desoximetasone (TOPICORT) 0.25 % cream APPLY SPARINGLY TO AFFECTED AREA TWICE A DAY     L-Arginine 500 MG TABS Take 500 mg by mouth daily.      losartan (COZAAR) 100 MG tablet TAKE 1 TABLET BY MOUTH EVERY DAY 90 tablet 2   Magnesium 500 MG CAPS Take 500 mg by mouth daily.      Omega-3 Fatty Acids (FISH OIL) 1000 MG CAPS Take 2,000 mg by mouth daily.      tiZANidine (ZANAFLEX) 4 MG tablet Take 1  tablet (4 mg total) by mouth every 6 (six) hours as needed for muscle spasms. 90 tablet 0   traMADol (ULTRAM) 50 MG tablet Take 50 mg by mouth every 12 (twelve) hours as needed for moderate pain.     vitamin B-12 (CYANOCOBALAMIN) 1000 MCG tablet Take 1,000 mcg by mouth daily.      Zinc 50 MG TABS Take 50 mg by mouth daily.      No current facility-administered medications on file prior to visit.   No Known Allergies Social History   Socioeconomic History   Marital status: Married    Spouse name: Not on file   Number of children: Not on file   Years of education: Not on file   Highest education level: Bachelor's degree (e.g., BA, AB, BS)  Occupational History   Not on file  Tobacco Use   Smoking status: Never   Smokeless tobacco: Never  Substance and Sexual Activity   Alcohol use: No   Drug use: No   Sexual activity: Not Currently    Birth control/protection: None    Comment: married to Chad Serrano.  Retired.  Weightlifter  Other Topics Concern   Not on file  Social History Narrative   Not on file   Social Determinants of Health   Financial Resource Strain: Low Risk  (03/12/2023)   Overall Financial Resource Strain (CARDIA)    Difficulty of Paying Living Expenses: Not hard at all  Food Insecurity: No Food Insecurity (03/12/2023)   Hunger Vital Sign    Worried About Running Out of Food in the Last Year: Never true    Ran Out of Food in the Last Year: Never true  Transportation Needs: No Transportation Needs (03/12/2023)   PRAPARE - Administrator, Civil Service (Medical): No    Lack of Transportation (Non-Medical): No  Physical Activity: Sufficiently Active (03/12/2023)   Exercise Vital Sign    Days of Exercise per Week: 3 days    Minutes of Exercise per Session: 90 min  Stress: No Stress Concern Present (03/12/2023)   [employer] of Occupational Health - Occupational Stress Questionnaire    Feeling of Stress : Not at all  Social Connections: Socially  Integrated (03/12/2023)   Social Connection and Isolation Panel [NHANES]    Frequency of Communication with Friends and Family: Twice a week    Frequency of Social Gatherings with Friends and Family: Once a week    Attends Religious Services: More than 4 times per year    Active Member of [employer] or Organizations: Yes    Attends Banker Meetings: More than 4 times per year    Marital Status: Married  Catering manager Violence: Not At Risk (09/22/2021)   Humiliation, Afraid, Rape, and Kick questionnaire    Fear of Current or Ex-Partner: No    Emotionally Abused: No    Physically Abused: No    Sexually Abused: No      Review of Systems  All other systems reviewed and are negative.      Objective:   Physical Exam Constitutional:      Appearance: He is well-developed.  HENT:     Head: Normocephalic and atraumatic.  Cardiovascular:     Rate and Rhythm: Normal rate and regular rhythm.     Heart sounds: Normal heart sounds.  Pulmonary:     Effort: Pulmonary effort is normal. No respiratory distress.     Breath sounds: Normal breath sounds. No wheezing or rales.  Abdominal:     Hernia: There is no hernia in the left inguinal area.  Musculoskeletal:       Arms:     Cervical back: Normal range of motion and neck supple.  Lymphadenopathy:     Lower Body: No right inguinal adenopathy. No left inguinal adenopathy.           Assessment & Plan:  Skin tear of upper arm without complication, right, sequela Patient request that I cauterized the wound.  I anesthetized the wound with 0.1% lidocaine with epinephrine.  I then applied Drysol to the base of the skin tear.  There was no witnessed bleeding after this.  I applied Neosporin, petroleum gauze, and wrapped the arm and Coban.  Remove dressing after 48 hours.

## 2023-03-29 ENCOUNTER — Telehealth: Payer: Self-pay

## 2023-03-29 MED ORDER — ALLOPURINOL 100 MG PO TABS: 200.0000 mg | ORAL_TABLET | Freq: Every day | ORAL | 3 refills | Status: DC

## 2023-03-29 NOTE — Telephone Encounter (Signed)
Copied from CRM [id number]. Topic: Clinical - Medication Refill >> Mar 29, 2023 12:49 PM Tiffany H wrote: Most Recent Primary Care Visit:  Provider: Lynnea Ferrier T  Department: [facility]  Visit Type: OFFICE VISIT  Date: 03/12/2023  Medication: allopurinol (ZYLOPRIM) 100 MG tablet 2x per day   Has the patient contacted their pharmacy? Yes (Agent: If no, request that the patient contact the pharmacy for the refill. If patient does not wish to contact the pharmacy document the reason why and proceed with request.) (Agent: If yes, when and what did the pharmacy advise?) Pharmacy advised that a refill request was sent three days ago. Patient will run out of medication before the end of the weekend. Please expedite.   Is this the correct pharmacy for this prescription? Yes If no, delete pharmacy and type the correct one.  This is the patient's preferred pharmacy:  [id number] [place], [place] - [address] [place] [address] Phone: [phone] Fax: [phone]   Has the prescription been filled recently? No  Is the patient out of the medication? Yes  Has the patient been seen for an appointment in the last year OR does the patient have an upcoming appointment? Yes  Can we respond through MyChart? Yes  Agent: Please be advised that Rx refills may take up to 3 business days. We ask that you follow-up with your pharmacy.

## 2023-04-09 DIAGNOSIS — L570 Actinic keratosis: Secondary | ICD-10-CM | POA: Diagnosis not present

## 2023-04-09 DIAGNOSIS — L821 Other seborrheic keratosis: Secondary | ICD-10-CM | POA: Diagnosis not present

## 2023-04-09 DIAGNOSIS — L820 Inflamed seborrheic keratosis: Secondary | ICD-10-CM | POA: Diagnosis not present

## 2023-04-09 DIAGNOSIS — L400 Psoriasis vulgaris: Secondary | ICD-10-CM | POA: Diagnosis not present

## 2023-04-09 DIAGNOSIS — Z85820 Personal history of malignant melanoma of skin: Secondary | ICD-10-CM | POA: Diagnosis not present

## 2023-04-09 DIAGNOSIS — L812 Freckles: Secondary | ICD-10-CM | POA: Diagnosis not present

## 2023-04-10 ENCOUNTER — Encounter (INDEPENDENT_AMBULATORY_CARE_PROVIDER_SITE_OTHER): Payer: Self-pay

## 2023-04-10 ENCOUNTER — Ambulatory Visit (INDEPENDENT_AMBULATORY_CARE_PROVIDER_SITE_OTHER): Payer: Medicare Other | Admitting: Otolaryngology

## 2023-04-10 ENCOUNTER — Other Ambulatory Visit: Payer: Self-pay | Admitting: Family Medicine

## 2023-04-10 VITALS — Ht 71.0 in | Wt 222.0 lb

## 2023-04-10 DIAGNOSIS — J300 Vasomotor rhinitis: Secondary | ICD-10-CM | POA: Diagnosis not present

## 2023-04-10 DIAGNOSIS — R053 Chronic cough: Secondary | ICD-10-CM

## 2023-04-10 DIAGNOSIS — R0989 Other specified symptoms and signs involving the circulatory and respiratory systems: Secondary | ICD-10-CM | POA: Diagnosis not present

## 2023-04-10 MED ORDER — IPRATROPIUM BROMIDE 0.06 % NA SOLN: 2.0000 | Freq: Two times a day (BID) | NASAL | 12 refills | Status: DC | PRN

## 2023-04-10 MED ORDER — FAMOTIDINE 20 MG PO TABS: 20.0000 mg | ORAL_TABLET | Freq: Two times a day (BID) | ORAL | 2 refills | Status: DC

## 2023-04-10 NOTE — Progress Notes (Signed)
Dear Dr. Tanya Nones, Here is my assessment for our mutual patient, Chad Serrano. Thank you for allowing me the opportunity to care for your patient. Please do not hesitate to contact me should you have any other questions. Sincerely, Dr. Jovita Kussmaul  Otolaryngology Clinic Note Referring provider: Dr. Tanya Nones HPI:  Chad Serrano is a 77 y.o. male kindly referred by Dr. Tanya Nones for evaluation of chronic cough and throat clearing.  Patient reports: he has had it for about 3 years. He reports that he was diagnosed with LPR by Dr. Tanya Nones and was treated with a PPI but did not help. He feels like his throat is dry and he feels like he has to clear his throat. All the time. Mostly just throat clearing, but occasional cough (dry). "Almost like a nervous energy." He does report that he was just getting over PNA/COVID when this happened. Only thing that makes it better is drinking water, feeling like his throat getting dry makes it worse.  He is currently taking xyzal and flonase - it does not help some. Flonase helps some. He is a Sport and exercise psychologist. Patient otherwise denies: - dysphagia, odynophagia, aspiration episodes or PNA, need for Heimlich, unintentional weight loss - changes in voice, shortness of breath, hemoptysis, tobacco or significant alcohol history - ear pain, neck masses  Does not drink coffee; drinks 4-6 glasses  He has seen pulm before (Dr. Sherene Sires) and treated with Tessalon Pearls, cough gradually improved (2020). Took tramadol but did not help, no dyspnea.  He also reports his nose runs when he eats. Not associated with particular type of food. No typical AR symptoms. No post nasal drip or sinus infection. No significant frequent sinonasal infections or history of infections.   H&N Surgery: no Personal or FHx of bleeding dz or anesthesia difficulty: no  AP/AC: Plavix  Tobacco: denies. Occupation: Occupational hygienist and transportation (retired now). Lives in [place]  PMHx:  Melanoma (back), Stroke (right retinal artery occlusion)  Independent Review of Additional Tests or Records:  CT Face 10/2018 (limited): no sinus disease on independent review CT Neck Angio 2021: No large masses, some small pulm nodes; on independent reivew  PMH/Meds/All/SocHx/FamHx/ROS:   Past Medical History:  Diagnosis Date   Anxiety    Chronic kidney disease    Gout    Hyperlipidemia    Hypertension    Hypogonadism male    Hypogonadism male    Melanoma (HCC)    at T4 surgically excised at [facility] (2020) free margins and sentinel node biopsy negative   Melanoma (HCC)    Stroke (HCC)    cryptogenic, right retinal artery occlusion     Past Surgical History:  Procedure Laterality Date   APPENDECTOMY     CHOLECYSTECTOMY  06/14/2020   COLONOSCOPY     GALLBLADDER SURGERY  05/2020   ROTATOR CUFF REPAIR Right     Family History  Problem Relation Age of Onset   Colon polyps Father    Cancer Mother    Colon cancer Neg Hx    Esophageal cancer Neg Hx    Rectal cancer Neg Hx    Stomach cancer Neg Hx      Social Connections: Socially Integrated (03/12/2023)   Social Connection and Isolation Panel [NHANES]    Frequency of Communication with Friends and Family: Twice a week    Frequency of Social Gatherings with Friends and Family: Once a week    Attends Religious Services: More than 4 times per year    Active Member of [employer]  or Organizations: Yes    Attends Banker Meetings: More than 4 times per year    Marital Status: Married      Current Outpatient Medications:    allopurinol (ZYLOPRIM) 100 MG tablet, Take 2 tablets (200 mg total) by mouth daily., Disp: 180 tablet, Rfl: 3   Ascorbic Acid (VITAMIN C) 1000 MG tablet, Take 1,000 mg by mouth daily., Disp: , Rfl:    atorvastatin (LIPITOR) 40 MG tablet, TAKE 1 TABLET BY MOUTH DAILY AT 6 PM, Disp: 90 tablet, Rfl: 0   Cholecalciferol (VITAMIN D3) 2000 UNITS TABS, Take 2,000 Units by mouth daily. , Disp: , Rfl:     clopidogrel (PLAVIX) 75 MG tablet, TAKE 1 TABLET BY MOUTH EVERY DAY, Disp: 90 tablet, Rfl: 1   desoximetasone (TOPICORT) 0.25 % cream, APPLY SPARINGLY TO AFFECTED AREA TWICE A DAY, Disp: , Rfl:    famotidine (PEPCID) 20 MG tablet, Take 1 tablet (20 mg total) by mouth 2 (two) times daily., Disp: 30 tablet, Rfl: 2   ipratropium (ATROVENT) 0.06 % nasal spray, Place 2 sprays into both nostrils 2 (two) times daily as needed for rhinitis., Disp: 15 mL, Rfl: 12   L-Arginine 500 MG TABS, Take 500 mg by mouth daily. , Disp: , Rfl:    losartan (COZAAR) 100 MG tablet, TAKE 1 TABLET BY MOUTH EVERY DAY, Disp: 90 tablet, Rfl: 2   Magnesium 500 MG CAPS, Take 500 mg by mouth daily. , Disp: , Rfl:    Omega-3 Fatty Acids (FISH OIL) 1000 MG CAPS, Take 2,000 mg by mouth daily. , Disp: , Rfl:    tiZANidine (ZANAFLEX) 4 MG tablet, Take 1 tablet (4 mg total) by mouth every 6 (six) hours as needed for muscle spasms., Disp: 90 tablet, Rfl: 0   traMADol (ULTRAM) 50 MG tablet, Take 50 mg by mouth every 12 (twelve) hours as needed for moderate pain., Disp: , Rfl:    vitamin B-12 (CYANOCOBALAMIN) 1000 MCG tablet, Take 1,000 mcg by mouth daily. , Disp: , Rfl:    Zinc 50 MG TABS, Take 50 mg by mouth daily. , Disp: , Rfl:    Physical Exam:   Ht 5\' 11"  (1.803 m)   Wt 222 lb (100.7 kg)   BMI 30.96 kg/m   Salient findings:  CN II-XII intact  Bilateral EAC clear and TM intact with well pneumatized middle ear spaces Anterior rhinoscopy: Septum relatively midline; bilateral inferior turbinates without significant hypertrophy No lesions of oral cavity/oropharynx No obviously palpable neck masses/lymphadenopathy/thyromegaly No respiratory distress or stridor; some throat clearing, no coughing; voice quality class 1.5 - given concern for chronic cough and throat clearing, TFL was indicated and performed below  Seprately Identifiable Procedures:  Procedure Note Pre-procedure diagnosis: Chronic cough, throat  clearing Post-procedure diagnosis: Same Procedure: Transnasal Fiberoptic Laryngoscopy, CPT 16109 - Mod 25 Indication: chronic cough, throat clearing Complications: None apparent EBL: 0 mL Date: 04/10/23   The procedure was undertaken to further evaluate the patient's complaint of chronic cough and throat clearing, with mirror exam inadequate for appropriate examination due to gag reflex and poor patient tolerance  Procedure:  Patient was identified as correct patient. Verbal consent was obtained. The nose was sprayed with oxymetazoline and 4% lidocaine. The The flexible laryngoscope was passed through the nose to view the nasal cavity, pharynx (oropharynx, hypopharynx) and larynx.  The larynx was examined at rest and during multiple phonatory tasks. Documentation was obtained and reviewed with patient. The scope was removed. The patient tolerated the  procedure well.  Findings: The nasal cavity and nasopharynx did not reveal any masses or lesions, mucosa appeared to be without obvious lesions. The tongue base, pharyngeal walls, piriform sinuses, vallecula, epiglottis and postcricoid region are normal in appearance without significant retained secretions. The visualized portion of the subglottis and proximal trachea is widely patent. The vocal folds are mobile bilaterally. There are no lesions on the free edge of the vocal folds nor elsewhere in the larynx worrisome for malignancy.      Electronically signed by: Read Drivers, MD 04/10/2023 9:06 AM   Impression & Plans:  Chad Serrano is a 77 y.o. male with: 1. Chronic throat clearing   2. Chronic cough   3. Vasomotor rhinitis   Mostly a problem with throat clearing with rare cough now. Started after URI 3 years ago. No significant triggers or trickle, and I suspect this is just related to upper airway cough/laryngeal hypersensitivity and chronic throat clearing which has become habitual. TFL reassuring. He has seen pulm without  improvement, and has been on PPI without improvement and allergy medications. He does not have significant reflux or allergy symptoms. As such, we discussed his options and I expect he will do well with conservative course of management: Speech strategies - we discussed improving hydration and water sipping strategy and swallowing; refer to SLP Can switch him over to Famotidine nightly as a trial to see if it helps in case of LPR component Will not start neuromodulator currently given cough not main problem Stop Xyzal since it is not helping; can continue Flonase since he reports it helps some We discussed f/u and he would like to follow up as needed should he still have symptoms after therapy  Vasomotor rhinitis: begin ipratropium spray PRN  See below regarding exact medications prescribed this encounter including dosages and route: Meds ordered this encounter  Medications   famotidine (PEPCID) 20 MG tablet    Sig: Take 1 tablet (20 mg total) by mouth 2 (two) times daily.    Dispense:  30 tablet    Refill:  2   ipratropium (ATROVENT) 0.06 % nasal spray    Sig: Place 2 sprays into both nostrils 2 (two) times daily as needed for rhinitis.    Dispense:  15 mL    Refill:  12      Thank you for allowing me the opportunity to care for your patient. Please do not hesitate to contact me should you have any other questions.  Sincerely, Jovita Kussmaul, MD Otolarynoglogist (ENT), [facility] Phone: [phone] Fax: [phone]  04/10/2023, 9:06 AM   MDM:  Level 4 Complexity/Problems addressed: mod - new problem, chronic problem Data complexity: mod - independent review of CT and notes - Morbidity: mod  - Prescription Drug prescribed or managed: yes

## 2023-04-11 NOTE — Telephone Encounter (Signed)
Requested Prescriptions  Pending Prescriptions Disp Refills   atorvastatin (LIPITOR) 40 MG tablet [Pharmacy Med Name: ATORVASTATIN 40 MG TABLET] 90 tablet 0    Sig: TAKE 1 TABLET BY MOUTH DAILY AT 6 PM     Cardiovascular:  Antilipid - Statins Failed - 04/10/2023  1:32 PM      Failed - Valid encounter within last 12 months    Recent Outpatient Visits           1 year ago Chronic cough   [facility] Tanya Nones, Priscille Heidelberg, MD   1 year ago Excessive cerumen in left ear canal   [facility] Valentino Nose, NP   2 years ago Left flank pain, chronic   [facility] Tanya Nones, Priscille Heidelberg, MD   2 years ago Cryptogenic stroke [facility])   [facility] Pickard, Priscille Heidelberg, MD   2 years ago Wound of left cheek, initial encounter   [facility] Cathlean Marseilles A, NP              Failed - Lipid Panel in normal range within the last 12 months    Cholesterol  Date Value Ref Range Status  02/11/2023 130 <200 mg/dL Final   LDL Cholesterol (Calc)  Date Value Ref Range Status  02/11/2023 63 mg/dL (calc) Final    Comment:    Reference range: <100 . Desirable range <100 mg/dL for primary prevention;   <70 mg/dL for patients with CHD or diabetic patients  with > or = 2 CHD risk factors. Marland Kitchen LDL-C is now calculated using the Martin-Hopkins  calculation, which is a validated novel method providing  better accuracy than the Friedewald equation in the  estimation of LDL-C.  Horald Pollen et al. Lenox Ahr. 0272;536(64): 2061-2068  (http://education.[facility].com/faq/FAQ164)    HDL  Date Value Ref Range Status  02/11/2023 52 > OR = 40 mg/dL Final   Triglycerides  Date Value Ref Range Status  02/11/2023 73 <150 mg/dL Final         Passed - Patient is not pregnant

## 2023-04-18 ENCOUNTER — Other Ambulatory Visit (INDEPENDENT_AMBULATORY_CARE_PROVIDER_SITE_OTHER): Payer: Self-pay | Admitting: Otolaryngology

## 2023-04-19 ENCOUNTER — Other Ambulatory Visit: Payer: Self-pay | Admitting: Family Medicine

## 2023-04-22 NOTE — Telephone Encounter (Signed)
Requested Prescriptions  Pending Prescriptions Disp Refills   losartan (COZAAR) 100 MG tablet [Pharmacy Med Name: LOSARTAN POTASSIUM 100 MG TAB] 90 tablet 1    Sig: TAKE 1 TABLET BY MOUTH EVERY DAY     Cardiovascular:  Angiotensin Receptor Blockers Failed - 04/19/2023  1:47 AM      Failed - Valid encounter within last 6 months    Recent Outpatient Visits           1 year ago Chronic cough   [facility] Pickard, Priscille Heidelberg, MD   1 year ago Excessive cerumen in left ear canal   [facility] Valentino Nose, NP   2 years ago Left flank pain, chronic   [facility] Tanya Nones, Priscille Heidelberg, MD   2 years ago Cryptogenic stroke [facility]   [facility] Donita Brooks, MD   2 years ago Wound of left cheek, initial encounter   [facility] Valentino Nose, NP              Passed - Cr in normal range and within 180 days    Creat  Date Value Ref Range Status  02/11/2023 1.06 0.70 - 1.28 mg/dL Final         Passed - K in normal range and within 180 days    Potassium  Date Value Ref Range Status  02/11/2023 4.2 3.5 - 5.3 mmol/L Final         Passed - Patient is not pregnant      Passed - Last BP in normal range    BP Readings from Last 1 Encounters:  03/12/23 132/84

## 2023-05-06 DIAGNOSIS — M5416 Radiculopathy, lumbar region: Secondary | ICD-10-CM | POA: Diagnosis not present

## 2023-06-04 DIAGNOSIS — Z961 Presence of intraocular lens: Secondary | ICD-10-CM | POA: Diagnosis not present

## 2023-06-04 DIAGNOSIS — H26492 Other secondary cataract, left eye: Secondary | ICD-10-CM | POA: Diagnosis not present

## 2023-06-04 DIAGNOSIS — H2511 Age-related nuclear cataract, right eye: Secondary | ICD-10-CM | POA: Diagnosis not present

## 2023-06-04 DIAGNOSIS — H18413 Arcus senilis, bilateral: Secondary | ICD-10-CM | POA: Diagnosis not present

## 2023-06-04 DIAGNOSIS — H3411 Central retinal artery occlusion, right eye: Secondary | ICD-10-CM | POA: Diagnosis not present

## 2023-06-11 DIAGNOSIS — H2512 Age-related nuclear cataract, left eye: Secondary | ICD-10-CM | POA: Diagnosis not present

## 2023-06-11 DIAGNOSIS — H16142 Punctate keratitis, left eye: Secondary | ICD-10-CM | POA: Diagnosis not present

## 2023-06-11 DIAGNOSIS — Z9842 Cataract extraction status, left eye: Secondary | ICD-10-CM | POA: Diagnosis not present

## 2023-06-11 DIAGNOSIS — Z961 Presence of intraocular lens: Secondary | ICD-10-CM | POA: Diagnosis not present

## 2023-06-21 DIAGNOSIS — H16142 Punctate keratitis, left eye: Secondary | ICD-10-CM | POA: Diagnosis not present

## 2023-06-28 ENCOUNTER — Encounter: Payer: Self-pay | Admitting: Physician Assistant

## 2023-07-07 ENCOUNTER — Other Ambulatory Visit: Payer: Self-pay | Admitting: Family Medicine

## 2023-07-16 DIAGNOSIS — H02054 Trichiasis without entropian left upper eyelid: Secondary | ICD-10-CM | POA: Diagnosis not present

## 2023-07-16 DIAGNOSIS — H16142 Punctate keratitis, left eye: Secondary | ICD-10-CM | POA: Diagnosis not present

## 2023-07-17 DIAGNOSIS — C4359 Malignant melanoma of other part of trunk: Secondary | ICD-10-CM | POA: Diagnosis not present

## 2023-07-19 ENCOUNTER — Other Ambulatory Visit: Payer: Self-pay | Admitting: Family Medicine

## 2023-07-19 NOTE — Telephone Encounter (Signed)
 Copied from CRM [id number]. Topic: Clinical - Medication Refill >> Jul 19, 2023  1:03 PM Herbert Seta B wrote: Most Recent Primary Care Visit:  Provider: Lynnea Ferrier T  Department: [facility]  Visit Type: OFFICE VISIT  Date: 03/12/2023  Medication: tiZANidine (ZANAFLEX) 4 MG tablet  Has the patient contacted their pharmacy? Yes-needs sent (Agent: If no, request that the patient contact the pharmacy for the refill. If patient does not wish to contact the pharmacy document the reason why and proceed with request.) (Agent: If yes, when and what did the pharmacy advise?)  Is this the correct pharmacy for this prescription? yes If no, delete pharmacy and type the correct one.  This is the patient's preferred pharmacy:  [id number] [place], [place] - [address] [place] [address] Phone: [phone] Fax: [phone]   Has the prescription been filled recently? no  Is the patient out of the medication? yes  Has the patient been seen for an appointment in the last year OR does the patient have an upcoming appointment? yes  Can we respond through MyChart? yes  Agent: Please be advised that Rx refills may take up to 3 business days. We ask that you follow-up with your pharmacy.

## 2023-07-22 ENCOUNTER — Encounter: Payer: Self-pay | Admitting: Family Medicine

## 2023-07-22 ENCOUNTER — Telehealth: Payer: Self-pay

## 2023-07-22 ENCOUNTER — Other Ambulatory Visit: Payer: Self-pay | Admitting: Family Medicine

## 2023-07-22 MED ORDER — TIZANIDINE HCL 4 MG PO TABS: 4.0000 mg | ORAL_TABLET | Freq: Four times a day (QID) | ORAL | 3 refills | Status: AC | PRN

## 2023-07-22 NOTE — Telephone Encounter (Signed)
 Requested medication (s) are due for refill today: yes  Requested medication (s) are on the active medication list: yes  Last refill:  01/25/23 #90  Future visit scheduled: no  Notes to clinic:  med not delegated to NT to RF   Requested Prescriptions  Pending Prescriptions Disp Refills   tiZANidine (ZANAFLEX) 4 MG tablet 90 tablet 0    Sig: Take 1 tablet (4 mg total) by mouth every 6 (six) hours as needed for muscle spasms.     Not Delegated - Cardiovascular:  Alpha-2 Agonists - tizanidine Failed - 07/22/2023 11:53 AM      Failed - This refill cannot be delegated      Failed - Valid encounter within last 6 months    Recent Outpatient Visits           2 years ago Chronic cough   [facility] Tanya Nones, Priscille Heidelberg, MD   2 years ago Excessive cerumen in left ear canal   [facility] Valentino Nose, NP   2 years ago Left flank pain, chronic   [facility] Donita Brooks, MD   2 years ago Cryptogenic stroke [facility])   [facility] Donita Brooks, MD   2 years ago Wound of left cheek, initial encounter   [facility] Valentino Nose, NP

## 2023-07-22 NOTE — Telephone Encounter (Signed)
 Pt would like a refill on his Tinzanaidne. Last RF was 01/2023.

## 2023-07-22 NOTE — Telephone Encounter (Signed)
 Pt called and would like a refill on his Tizanidine 4 mg. Last Rf was 01/25/2023. Thank you.

## 2023-07-31 ENCOUNTER — Telehealth: Payer: Self-pay

## 2023-07-31 NOTE — Telephone Encounter (Signed)
 Copied from CRM [id number]. Topic: General - Other >> Jul 31, 2023  1:26 PM Emylou G wrote: Reason for CRM: Hospital doctor w/[place] [facility] called.. Cardiac Clearance is expired.. checking status.. [phone].. needs asap due to monday appt.. can leave voicemal or fax [phone]

## 2023-08-05 DIAGNOSIS — M5416 Radiculopathy, lumbar region: Secondary | ICD-10-CM | POA: Diagnosis not present

## 2023-08-12 ENCOUNTER — Ambulatory Visit: Payer: Medicare Other | Admitting: Physician Assistant

## 2023-08-12 ENCOUNTER — Encounter: Payer: Self-pay | Admitting: Physician Assistant

## 2023-08-12 ENCOUNTER — Telehealth: Payer: Self-pay | Admitting: *Deleted

## 2023-08-12 VITALS — BP 132/84 | HR 80 | Ht 71.0 in | Wt 227.5 lb

## 2023-08-12 DIAGNOSIS — Z860101 Personal history of adenomatous and serrated colon polyps: Secondary | ICD-10-CM

## 2023-08-12 DIAGNOSIS — K5900 Constipation, unspecified: Secondary | ICD-10-CM

## 2023-08-12 DIAGNOSIS — Z7901 Long term (current) use of anticoagulants: Secondary | ICD-10-CM

## 2023-08-12 DIAGNOSIS — Z8673 Personal history of transient ischemic attack (TIA), and cerebral infarction without residual deficits: Secondary | ICD-10-CM | POA: Diagnosis not present

## 2023-08-12 MED ORDER — SUFLAVE 178.7 G PO SOLR: 1.0000 | Freq: Once | ORAL | 0 refills | Status: AC

## 2023-08-12 NOTE — Patient Instructions (Addendum)
 You will be contacted by our office prior to your procedure for directions on holding your Plavix.  If you do not hear from our office 1 week prior to your scheduled procedure, please call [phone] to discuss.   You have been scheduled for a colonoscopy. Please follow written instructions given to you at your visit today.   If you use inhalers (even only as needed), please bring them with you on the day of your procedure.  DO NOT TAKE 7 DAYS PRIOR TO TEST- Trulicity (dulaglutide) Ozempic, Wegovy (semaglutide) Mounjaro (tirzepatide) Bydureon Bcise (exanatide extended release)  DO NOT TAKE 1 DAY PRIOR TO YOUR TEST Rybelsus (semaglutide) Adlyxin (lixisenatide) Victoza (liraglutide) Byetta (exanatide) _______________________________________________________________  Bonita Quin will receive your bowel preparation through [facility], which ensures the lowest copay and home delivery, with outreach via text or call from an [phone] number. Please respond promptly to avoid rescheduling of your procedure. If you are interested in alternative options or have any questions regarding your prep, please contact them at [phone] ____________________________________________________________________________  Your Provider Has Sent Your Bowel Prep Regimen To [facility]   [facility] will contact you to verify your information and collect your copay, if applicable. Enjoy the comfort of your home while your prescription is mailed to you, FREE of any shipping charges.   [facility] accepts all major insurance benefits and applies discounts & coupons.  Have additional questions?   Chat: [email or web address] Call: [phone] Email: [email or web address] NCPDP: [id number]  How will [facility] contact you?  With a Welcome phone call,  a Welcome text and a checkout link in text form.  Texts you receive from [phone] Are NOT Spam.  *To set up delivery, you must complete the checkout process via  link or speak to one of the patient care representatives. If [facility] is unable to reach you, your prescription may be delayed.  To avoid long hold times on the phone, you may also utilize the secure chat feature on the [facility] website to request that they call you back for transaction completion or to expedite your concerns.  _______________________________________________________  If your blood pressure at your visit was 140/90 or greater, please contact your primary care physician to follow up on this.  _______________________________________________________  If you are age 45 or older, your body mass index should be between 23-30. Your Body mass index is 31.73 kg/m. If this is out of the aforementioned range listed, please consider follow up with your Primary Care Provider.  If you are age 30 or younger, your body mass index should be between 19-25. Your Body mass index is 31.73 kg/m. If this is out of the aformentioned range listed, please consider follow up with your Primary Care Provider.   ________________________________________________________  The [facility] providers would like to encourage you to use [facility] to communicate with providers for non-urgent requests or questions.  Due to long hold times on the telephone, sending your provider a message by [facility] may be a faster and more efficient way to get a response.  Please allow 48 business hours for a response.  Please remember that this is for non-urgent requests.  _______________________________________________________

## 2023-08-12 NOTE — Progress Notes (Signed)
 Chief Complaint: Discuss colonoscopy on chronic anticoagulation  HPI:    Chad Serrano is a 78 year old male with a past medical history as listed below including stroke on Plavix (05/26/2019 echo with LVEF 55-60%), known to Dr. Meridee Score, who was referred to me by Donita Brooks, MD for consideration of a colonoscopy on chronic anticoagulation.    02/04/2018 colonoscopy with Dr. Meridee Score with one 2 mm polypoid lesion in the proximal ascending colon, nonbleeding nonthrombosed internal hemorrhoids and anal papula.  Pathology showed either sessile serrated polyp or hyperplastic, repeat recommended in 5 years.    Today, patient presents to clinic and tells me he has no GI issues.  He does tell me that occasionally he is constipated and about once a week he has to use some MiraLAX but this relieves him.  Otherwise no abdominal pain, bloating, heartburn, reflux, weight loss or blood in his stool.  Past Medical History:  Diagnosis Date   Anxiety    Chronic kidney disease    Gout    Hyperlipidemia    Hypertension    Hypogonadism male    Hypogonadism male    Melanoma (HCC)    at T4 surgically excised at [facility] (2020) free margins and sentinel node biopsy negative   Melanoma (HCC)    Stroke (HCC)    cryptogenic, right retinal artery occlusion    Past Surgical History:  Procedure Laterality Date   APPENDECTOMY     CHOLECYSTECTOMY  06/14/2020   COLONOSCOPY     GALLBLADDER SURGERY  05/2020   ROTATOR CUFF REPAIR Right     Current Outpatient Medications  Medication Sig Dispense Refill   allopurinol (ZYLOPRIM) 100 MG tablet TAKE 2 TABLETS BY MOUTH EVERY DAY 180 tablet 3   Ascorbic Acid (VITAMIN C) 1000 MG tablet Take 1,000 mg by mouth daily.     atorvastatin (LIPITOR) 40 MG tablet TAKE 1 TABLET BY MOUTH DAILY AT 6 PM 90 tablet 0   Cholecalciferol (VITAMIN D3) 2000 UNITS TABS Take 2,000 Units by mouth daily.      clopidogrel (PLAVIX) 75 MG tablet TAKE 1 TABLET BY MOUTH EVERY DAY 90 tablet 1    desoximetasone (TOPICORT) 0.25 % cream APPLY SPARINGLY TO AFFECTED AREA TWICE A DAY     famotidine (PEPCID) 20 MG tablet TAKE 1 TABLET BY MOUTH TWICE A DAY 180 tablet 1   fluticasone (FLONASE) 50 MCG/ACT nasal spray Place 1 spray into both nostrils 2 (two) times daily.     L-Arginine 500 MG TABS Take 500 mg by mouth daily.      levocetirizine (XYZAL) 5 MG tablet Take 5 mg by mouth every evening.     losartan (COZAAR) 100 MG tablet TAKE 1 TABLET BY MOUTH EVERY DAY 90 tablet 1   Magnesium 500 MG CAPS Take 500 mg by mouth daily.      Omega-3 Fatty Acids (FISH OIL) 1000 MG CAPS Take 2,000 mg by mouth daily.      polyethylene glycol (MIRALAX / GLYCOLAX) 17 g packet Take 17 g by mouth daily as needed.     pyridOXINE (VITAMIN B6) 100 MG tablet Take 100 mg by mouth daily.     tiZANidine (ZANAFLEX) 4 MG tablet Take 1 tablet (4 mg total) by mouth every 6 (six) hours as needed for muscle spasms. 90 tablet 3   traMADol (ULTRAM) 50 MG tablet Take 50 mg by mouth every 12 (twelve) hours as needed for moderate pain.     vitamin B-12 (CYANOCOBALAMIN) 1000 MCG tablet Take  1,000 mcg by mouth daily.      Zinc 50 MG TABS Take 50 mg by mouth daily.      No current facility-administered medications for this visit.    Allergies as of 08/12/2023   (No Known Allergies)    Family History  Problem Relation Age of Onset   Cancer Mother    Colon polyps Father    Colon cancer Neg Hx    Esophageal cancer Neg Hx    Rectal cancer Neg Hx    Stomach cancer Neg Hx    Pancreatic cancer Neg Hx     Social History   Socioeconomic History   Marital status: Married    Spouse name: Not on file   Number of children: Not on file   Years of education: Not on file   Highest education level: Bachelor's degree (e.g., BA, AB, BS)  Occupational History   Not on file  Tobacco Use   Smoking status: Never   Smokeless tobacco: Never  Vaping Use   Vaping status: Never Used  Substance and Sexual Activity   Alcohol use: No    Drug use: No   Sexual activity: Not Currently    Birth control/protection: None    Comment: 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  Other Topics Concern   Not on file  Social History Narrative   Not on file   Social Drivers of Health   Financial Resource Strain: Low Risk  (03/12/2023)   Overall Financial Resource Strain (CARDIA)    Difficulty of Paying Living Expenses: Not hard at all  Food Insecurity: No Food Insecurity (03/12/2023)   Hunger Vital Sign    Worried About Running Out of Food in the Last Year: Never true    Ran Out of Food in the Last Year: Never true  Transportation Needs: No Transportation Needs (03/12/2023)   PRAPARE - Administrator, Civil Service (Medical): No    Lack of Transportation (Non-Medical): No  Physical Activity: Sufficiently Active (03/12/2023)   Exercise Vital Sign    Days of Exercise per Week: 3 days    Minutes of Exercise per Session: 90 min  Stress: No Stress Concern Present (03/12/2023)   [employer] of Occupational Health - Occupational Stress Questionnaire    Feeling of Stress : Not at all  Social Connections: Socially Integrated (03/12/2023)   Social Connection and Isolation Panel [NHANES]    Frequency of Communication with Friends and Family: Twice a week    Frequency of Social Gatherings with Friends and Family: Once a week    Attends Religious Services: More than 4 times per year    Active Member of [employer] or Organizations: Yes    Attends Engineer, structural: More than 4 times per year    Marital Status: Married  Catering manager Violence: Not At Risk (09/22/2021)   Humiliation, Afraid, Rape, and Kick questionnaire    Fear of Current or Ex-Partner: No    Emotionally Abused: No    Physically Abused: No    Sexually Abused: No    Review of Systems:    Constitutional: No weight loss, fever or chills Skin: No rash Cardiovascular: No chest pain Respiratory: No SOB  Gastrointestinal: See HPI and  otherwise negative Genitourinary: No dysuria  Neurological: No headache, dizziness or syncope Musculoskeletal: No new muscle or joint pain Hematologic: No bleeding  Psychiatric: No history of depression or anxiety   Physical Exam:  Vital signs: BP 132/84   Pulse 80  Ht 5\' 11"  (1.803 m)   Wt 227 lb 8 oz (103.2 kg)   SpO2 97%   BMI 31.73 kg/m    Constitutional:   Pleasant Elderly Caucasian male appears to be in NAD, Well developed, Well nourished, alert and cooperative Head:  Normocephalic and atraumatic. Eyes:   PEERL, EOMI. No icterus. Conjunctiva pink. Ears:  Normal auditory acuity. Neck:  Supple Throat: Oral cavity and pharynx without inflammation, swelling or lesion.  Respiratory: Respirations even and unlabored. Lungs clear to auscultation bilaterally.   No wheezes, crackles, or rhonchi.  Cardiovascular: Normal S1, S2. No MRG. Regular rate and rhythm. No peripheral edema, cyanosis or pallor.  Gastrointestinal:  Soft, nondistended, nontender. No rebound or guarding. Normal bowel sounds. No appreciable masses or hepatomegaly. Rectal:  Not performed.  Msk:  Symmetrical without gross deformities. Without edema, no deformity or joint abnormality.  Neurologic:  Alert and  oriented x4;  grossly normal neurologically.  Skin:   Dry and intact without significant lesions or rashes. Psychiatric: Demonstrates good judgement and reason without abnormal affect or behaviors.  RELEVANT LABS AND IMAGING: CBC    Component Value Date/Time   WBC 8.4 02/11/2023 0856   RBC 4.51 02/11/2023 0856   HGB 14.8 02/11/2023 0856   HCT 44.3 02/11/2023 0856   PLT 172 02/11/2023 0856   MCV 98.2 02/11/2023 0856   MCH 32.8 02/11/2023 0856   MCHC 33.4 02/11/2023 0856   RDW 13.1 02/11/2023 0856   LYMPHSABS 3,814 02/11/2023 0856   MONOABS 1.1 (H) 05/25/2019 1049   EOSABS 92 02/11/2023 0856   BASOSABS 34 02/11/2023 0856    CMP     Component Value Date/Time   NA 138 02/11/2023 0856   K 4.2  02/11/2023 0856   CL 103 02/11/2023 0856   CO2 26 02/11/2023 0856   GLUCOSE 83 02/11/2023 0856   BUN 21 02/11/2023 0856   CREATININE 1.06 02/11/2023 0856   CALCIUM 8.6 02/11/2023 0856   PROT 5.9 (L) 02/11/2023 0856   ALBUMIN 3.6 04/28/2020 0723   AST 17 02/11/2023 0856   ALT 22 02/11/2023 0856   ALKPHOS 71 04/28/2020 0723   BILITOT 1.6 (H) 02/11/2023 0856   GFRNONAA 66 09/14/2020 1019   GFRAA 76 09/14/2020 1019    Assessment: 1.  History of sessile serrated polyp: Last colonoscopy 02/04/2018 with repeat recommended in 5 years, patient is slightly overdue 2.  Constipation: About once a week asked to use MiraLAX which helps with this; likely age/diet related  Plan: 1.  Scheduled patient for a surveillance colonoscopy in the [facility] with Dr. Meridee Score.  Did provide the patient a detail list of risks for the procedure and he agrees to proceed. Patient is appropriate for endoscopic procedure(s) in the ambulatory ([facility]) setting.  2.  Patient advised to hold his Plavix for 5 days prior to time of procedure.  We will communicate with his prescribing physician to ensure this is acceptable for him. 3.  Patient advised to do a 2-day bowel prep given history of constipation. 4.  Patient to follow in clinic per recommendations from Dr. Meridee Score after time of procedure.  Hyacinth Meeker, PA-C [facility] 08/12/2023, 1:51 PM  Cc: Donita Brooks, MD

## 2023-08-12 NOTE — Progress Notes (Signed)
 Attending Physician's Attestation   I have reviewed the chart.   I agree with the Advanced Practitioner's note, impression, and recommendations with any updates as below.    Corliss Parish, MD [facility] Advanced Endoscopy Office # [phone]

## 2023-08-12 NOTE — Telephone Encounter (Signed)
  Chad Serrano [birth year] [id number]  08/12/2023   Dear Dr. Tanya Nones:  We have scheduled the above named patient for a(n) colonoscopy procedure. Our records show that (s)he is on anticoagulation therapy.  Please advise as to whether the patient may come off their therapy of Plavix 5 days prior to their procedure which is scheduled for 09/24/23.  Please route your response to Cristela Felt, CMA or fax response to [phone].  Sincerely,   Cristela Felt, [facility]

## 2023-08-20 NOTE — Telephone Encounter (Signed)
 Left message for patient to call office.

## 2023-08-20 NOTE — Telephone Encounter (Signed)
 Patient informed he is cleared to hold Plavix. Patient voiced understanding.

## 2023-08-26 ENCOUNTER — Ambulatory Visit: Admitting: Family Medicine

## 2023-08-26 VITALS — BP 122/80 | HR 89 | Temp 98.1°F | Ht 71.0 in | Wt 227.2 lb

## 2023-08-26 DIAGNOSIS — J302 Other seasonal allergic rhinitis: Secondary | ICD-10-CM | POA: Diagnosis not present

## 2023-08-26 MED ORDER — PREDNISONE 10 MG (21) PO TBPK: ORAL_TABLET | ORAL | 0 refills | Status: DC

## 2023-08-26 NOTE — Progress Notes (Unsigned)
 Patient Office Visit  Assessment & Plan:  Seasonal allergies -     predniSONE; Use as directed.  Dispense: 21 each; Refill: 0   Pt will continue with Xyzal, flonase. Pt will call back if not improving or worsening. Pt agreeable to plan Return if symptoms worsen or fail to improve.   Subjective:    Patient ID: Chad Serrano, male    DOB: 06/20/1945  Age: 78 y.o. MRN: [id number]  Chief Complaint  Patient presents with   Nasal Congestion    Yellow congestion x 3-4 days.     HPI Allergic Rhinitis: Pt having postnasal drip, congestion, nasal purulence for the past few days. No fever or chills. Patient's symptoms also include cough, headaches, nasal congestion, postnasal drip but no sinus pressure or dental pain. No SOB or wheezing. No sneezing but have been worse this season with the increased pollen exposure. The patient has been using OTC Xyzal and Flonase which usually helps. Wife became concerned because of colored mucus so wanted to make sure he did not have a sinus infection. Nonsmoker. Pt did see ENT last year for chronic throat clearing and cough.   The ASCVD Risk score (Arnett DK, et al., 2019) failed to calculate for the following reasons:   Risk score cannot be calculated because patient has a medical history suggesting prior/existing ASCVD  Past Medical History:  Diagnosis Date   Anxiety    Chronic kidney disease    Gout    Hyperlipidemia    Hypertension    Hypogonadism male    Hypogonadism male    Melanoma (HCC)    at T4 surgically excised at [facility] (2020) free margins and sentinel node biopsy negative   Melanoma (HCC)    Stroke (HCC)    cryptogenic, right retinal artery occlusion   Past Surgical History:  Procedure Laterality Date   APPENDECTOMY     CHOLECYSTECTOMY  06/14/2020   COLONOSCOPY     GALLBLADDER SURGERY  05/2020   ROTATOR CUFF REPAIR Right    Social History   Tobacco Use   Smoking status: Never   Smokeless tobacco: Never  Vaping Use   Vaping  status: Never Used  Substance Use Topics   Alcohol use: No   Drug use: No   Family History  Problem Relation Age of Onset   Cancer Mother    Melanoma Mother    Colon polyps Father    Colon cancer Neg Hx    Esophageal cancer Neg Hx    Rectal cancer Neg Hx    Stomach cancer Neg Hx    Pancreatic cancer Neg Hx    No Known Allergies  ROS    Objective:    BP 122/80   Pulse 89   Temp 98.1 F (36.7 C)   Ht 5\' 11"  (1.803 m)   Wt 227 lb 4 oz (103.1 kg)   SpO2 98%   BMI 31.69 kg/m  BP Readings from Last 3 Encounters:  08/26/23 122/80  08/12/23 132/84  03/12/23 132/84   Wt Readings from Last 3 Encounters:  08/26/23 227 lb 4 oz (103.1 kg)  08/12/23 227 lb 8 oz (103.2 kg)  04/10/23 222 lb (100.7 kg)    Physical Exam Vitals and nursing note reviewed.  Constitutional:      General: He is not in acute distress.    Appearance: Normal appearance.  HENT:     Head: Normocephalic.     Right Ear: Tympanic membrane and ear canal normal.  Left Ear: Tympanic membrane and ear canal normal.     Nose: Congestion present.     Right Turbinates: Swollen.     Left Turbinates: Swollen.     Right Sinus: No maxillary sinus tenderness.     Left Sinus: No maxillary sinus tenderness.     Mouth/Throat:     Pharynx: No oropharyngeal exudate or posterior oropharyngeal erythema.  Eyes:     Extraocular Movements: Extraocular movements intact.     Pupils: Pupils are equal, round, and reactive to light.  Cardiovascular:     Rate and Rhythm: Normal rate and regular rhythm.     Heart sounds: Normal heart sounds.  Pulmonary:     Effort: Pulmonary effort is normal.     Breath sounds: Normal breath sounds. No wheezing.  Neurological:     General: No focal deficit present.     Mental Status: He is alert and oriented to person, place, and time.      No results found for any visits on 08/26/23.

## 2023-08-29 ENCOUNTER — Other Ambulatory Visit: Payer: Self-pay | Admitting: Family Medicine

## 2023-08-29 NOTE — Telephone Encounter (Signed)
 Requested Prescriptions  Pending Prescriptions Disp Refills   clopidogrel (PLAVIX) 75 MG tablet [Pharmacy Med Name: CLOPIDOGREL 75 MG TABLET] 90 tablet 0    Sig: TAKE 1 TABLET BY MOUTH EVERY DAY     Hematology: Antiplatelets - clopidogrel Failed - 08/29/2023  2:42 PM      Failed - HCT in normal range and within 180 days    HCT  Date Value Ref Range Status  02/11/2023 44.3 38.5 - 50.0 % Final         Failed - HGB in normal range and within 180 days    Hemoglobin  Date Value Ref Range Status  02/11/2023 14.8 13.2 - 17.1 g/dL Final         Failed - PLT in normal range and within 180 days    Platelets  Date Value Ref Range Status  02/11/2023 172 140 - 400 Thousand/uL Final         Passed - Cr in normal range and within 360 days    Creat  Date Value Ref Range Status  02/11/2023 1.06 0.70 - 1.28 mg/dL Final         Passed - Valid encounter within last 6 months    Recent Outpatient Visits           3 days ago Seasonal allergies   [facility] Bernadette Hoit, MD   5 months ago Skin tear of upper arm without complication, right, sequela   [facility] Donita Brooks, MD   6 months ago Chronic throat clearing   [facility] Donita Brooks, MD   11 months ago Back muscle spasm   [facility] Tanya Nones, Priscille Heidelberg, MD   1 year ago Chronic cough   [facility] [place], Priscille Heidelberg, MD

## 2023-09-24 ENCOUNTER — Ambulatory Visit (AMBULATORY_SURGERY_CENTER): Admitting: Gastroenterology

## 2023-09-24 ENCOUNTER — Encounter: Payer: Self-pay | Admitting: Gastroenterology

## 2023-09-24 ENCOUNTER — Telehealth: Payer: Self-pay

## 2023-09-24 VITALS — BP 135/80 | HR 72 | Temp 98.1°F | Resp 11 | Ht 71.0 in | Wt 227.0 lb

## 2023-09-24 DIAGNOSIS — K5900 Constipation, unspecified: Secondary | ICD-10-CM | POA: Diagnosis not present

## 2023-09-24 DIAGNOSIS — K641 Second degree hemorrhoids: Secondary | ICD-10-CM | POA: Diagnosis not present

## 2023-09-24 DIAGNOSIS — F419 Anxiety disorder, unspecified: Secondary | ICD-10-CM | POA: Diagnosis not present

## 2023-09-24 DIAGNOSIS — Z860101 Personal history of adenomatous and serrated colon polyps: Secondary | ICD-10-CM

## 2023-09-24 DIAGNOSIS — E785 Hyperlipidemia, unspecified: Secondary | ICD-10-CM | POA: Diagnosis not present

## 2023-09-24 DIAGNOSIS — D123 Benign neoplasm of transverse colon: Secondary | ICD-10-CM | POA: Diagnosis not present

## 2023-09-24 DIAGNOSIS — D122 Benign neoplasm of ascending colon: Secondary | ICD-10-CM

## 2023-09-24 DIAGNOSIS — I10 Essential (primary) hypertension: Secondary | ICD-10-CM | POA: Diagnosis not present

## 2023-09-24 DIAGNOSIS — K635 Polyp of colon: Secondary | ICD-10-CM | POA: Diagnosis not present

## 2023-09-24 DIAGNOSIS — Z1211 Encounter for screening for malignant neoplasm of colon: Secondary | ICD-10-CM

## 2023-09-24 MED ORDER — SODIUM CHLORIDE 0.9 % IV SOLN: 500.0000 mL | Freq: Once | INTRAVENOUS | Status: DC

## 2023-09-24 NOTE — Op Note (Signed)
 [facility] Patient Name: Chad Serrano Procedure Date: 09/24/2023 9:03 AM MRN: [id number] Endoscopist: Yong Henle , MD, [id number] Age: 78 Referring MD:  Date of Birth: 26-Jul-1945 Gender: Male Account #: [id number] Procedure:                Colonoscopy Indications:              Surveillance: Personal history of adenomatous                            polyps on last colonoscopy 5 years ago Medicines:                Monitored Anesthesia Care Procedure:                Pre-Anesthesia Assessment:                           - Prior to the procedure, a History and Physical                            was performed, and patient medications and                            allergies were reviewed. The patient's tolerance of                            previous anesthesia was also reviewed. The risks                            and benefits of the procedure and the sedation                            options and risks were discussed with the patient.                            All questions were answered, and informed consent                            was obtained. Prior Anticoagulants: The patient has                            taken Plavix  (clopidogrel ), last dose was 5 days                            prior to procedure. ASA Grade Assessment: II - A                            patient with mild systemic disease. After reviewing                            the risks and benefits, the patient was deemed in                            satisfactory condition to undergo the procedure.  After obtaining informed consent, the colonoscope                            was passed under direct vision. Throughout the                            procedure, the patient's blood pressure, pulse, and                            oxygen saturations were monitored continuously. The                            CF [id number] #[id number] was introduced through the anus                             and advanced to the 3 cm into the ileum. The                            colonoscopy was performed without difficulty. The                            patient tolerated the procedure. The quality of the                            bowel preparation was good. The terminal ileum,                            ileocecal valve, appendiceal orifice, and rectum                            were photographed. Scope In: 9:21:47 AM Scope Out: 9:38:39 AM Scope Withdrawal Time: 0 hours 13 minutes 58 seconds  Total Procedure Duration: 0 hours 16 minutes 52 seconds  Findings:                 The digital rectal exam findings include                            hemorrhoids. Pertinent negatives include no                            palpable rectal lesions.                           The terminal ileum and ileocecal valve appeared                            normal.                           Three sessile polyps were found in the transverse                            colon, hepatic flexure and ascending colon. The  polyps were 3 to 6 mm in size. These polyps were                            removed with a cold snare. Resection and retrieval                            were complete.                           Normal mucosa was found in the entire colon                            otherwise.                           Non-bleeding non-thrombosed internal hemorrhoids                            were found during retroflexion, during perianal                            exam and during digital exam. The hemorrhoids were                            Grade II (internal hemorrhoids that prolapse but                            reduce spontaneously). Complications:            No immediate complications. Estimated Blood Loss:     Estimated blood loss was minimal. Impression:               - Hemorrhoids found on digital rectal exam.                           - The examined portion of the ileum was  normal.                           - Three 3 to 6 mm polyps in the transverse colon,                            at the hepatic flexure and in the ascending colon,                            removed with a cold snare. Resected and retrieved.                           - Normal mucosa in the entire examined colon                            otherwise.                           - Non-bleeding non-thrombosed internal hemorrhoids. Recommendation:           - The patient will be  observed post-procedure,                            until all discharge criteria are met.                           - Discharge patient to home.                           - Patient has a contact number available for                            emergencies. The signs and symptoms of potential                            delayed complications were discussed with the                            patient. Return to normal activities tomorrow.                            Written discharge instructions were provided to the                            patient.                           - High fiber diet.                           - Use FiberCon 1-2 tablets PO daily.                           - May restart Plavix  5/7 to decrease risk of post                            interventional bleeding.                           - Continue present medications.                           - Await pathology results.                           - Repeat colonoscopy in 3/5/7 years for                            surveillance based on pathology results (patient                            will be 78 years of age at minimum, so we will need                            to discuss in clinic if he is going to want to  continue colon cancer screening colon polyp                            surveillance).                           - The findings and recommendations were discussed                            with the patient.                            - The findings and recommendations were discussed                            with the patient's family. Yong Henle, MD 09/24/2023 9:44:01 AM

## 2023-09-24 NOTE — Progress Notes (Signed)
 Sedate, gd SR, tolerated procedure well, VSS, report to RN

## 2023-09-24 NOTE — Progress Notes (Signed)
 Called to room to assist during endoscopic procedure.  Patient ID and intended procedure confirmed with present staff. Received instructions for my participation in the procedure from the performing physician.

## 2023-09-24 NOTE — Telephone Encounter (Signed)
 Mail received from [employer] that [employer] has issued a voluntary recall on 1 lot of Atorvastatin  Calcium  40 mg. The patient has been notified by [place]. Mjp,lpn

## 2023-09-24 NOTE — Patient Instructions (Signed)
 Resume previous diet and medications - restart plavix  on 5/7.  Handouts provided on polyps and hemorrhoids.  Biopsy results and recommendations on follow up colonoscopy to be sent through MyChart or by letter.    YOU HAD AN ENDOSCOPIC PROCEDURE TODAY AT THE [facility]:   Refer to the procedure report that was given to you for any specific questions about what was found during the examination.  If the procedure report does not answer your questions, please call your gastroenterologist to clarify.  If you requested that your care partner not be given the details of your procedure findings, then the procedure report has been included in a sealed envelope for you to review at your convenience later.  YOU SHOULD EXPECT: Some feelings of bloating in the abdomen. Passage of more gas than usual.  Walking can help get rid of the air that was put into your GI tract during the procedure and reduce the bloating. If you had a lower endoscopy (such as a colonoscopy or flexible sigmoidoscopy) you may notice spotting of blood in your stool or on the toilet paper. If you underwent a bowel prep for your procedure, you may not have a normal bowel movement for a few days.  Please Note:  You might notice some irritation and congestion in your nose or some drainage.  This is from the oxygen used during your procedure.  There is no need for concern and it should clear up in a day or so.  SYMPTOMS TO REPORT IMMEDIATELY:  Following lower endoscopy (colonoscopy or flexible sigmoidoscopy):  Excessive amounts of blood in the stool  Significant tenderness or worsening of abdominal pains  Swelling of the abdomen that is new, acute  Fever of 100F or higher  For urgent or emergent issues, a gastroenterologist can be reached at any hour by calling [phone]. Do not use MyChart messaging for urgent concerns.    DIET:  We do recommend a small meal at first, but then you may proceed to your regular diet.  Drink  plenty of fluids but you should avoid alcoholic beverages for 24 hours.  ACTIVITY:  You should plan to take it easy for the rest of today and you should NOT DRIVE or use heavy machinery until tomorrow (because of the sedation medicines used during the test).    FOLLOW UP: Our staff will call the number listed on your records the next business day following your procedure.  We will call around 7:15- 8:00 am to check on you and address any questions or concerns that you may have regarding the information given to you following your procedure. If we do not reach you, we will leave a message.     If any biopsies were taken you will be contacted by phone or by letter within the next 1-3 weeks.  Please call us  at [phone] if you have not heard about the biopsies in 3 weeks.    SIGNATURES/CONFIDENTIALITY: You and/or your care partner have signed paperwork which will be entered into your electronic medical record.  These signatures attest to the fact that that the information above on your After Visit Summary has been reviewed and is understood.  Full responsibility of the confidentiality of this discharge information lies with you and/or your care-partner.

## 2023-09-24 NOTE — Progress Notes (Signed)
 Pt's states no medical or surgical changes since previsit or office visit.

## 2023-09-24 NOTE — Progress Notes (Signed)
 GASTROENTEROLOGY PROCEDURE H&P NOTE   Primary Care Physician: Austine Lefort, MD  HPI: Chad Serrano is a 78 y.o. male  who presents for Colonoscopy for surveillance.  Past Medical History:  Diagnosis Date   Anxiety    Chronic kidney disease    Gout    Hyperlipidemia    Hypertension    Hypogonadism male    Hypogonadism male    Melanoma (HCC)    at T4 surgically excised at [facility] (2020) free margins and sentinel node biopsy negative   Melanoma (HCC)    Stroke (HCC)    cryptogenic, right retinal artery occlusion   Past Surgical History:  Procedure Laterality Date   APPENDECTOMY     CHOLECYSTECTOMY  06/14/2020   COLONOSCOPY     GALLBLADDER SURGERY  05/2020   ROTATOR CUFF REPAIR Right    Current Outpatient Medications  Medication Sig Dispense Refill   allopurinol  (ZYLOPRIM ) 100 MG tablet TAKE 2 TABLETS BY MOUTH EVERY DAY 180 tablet 3   Ascorbic Acid (VITAMIN C) 1000 MG tablet Take 1,000 mg by mouth daily.     atorvastatin  (LIPITOR) 40 MG tablet TAKE 1 TABLET BY MOUTH DAILY AT 6 PM 90 tablet 0   Cholecalciferol (VITAMIN D3) 2000 UNITS TABS Take 2,000 Units by mouth daily.      clopidogrel  (PLAVIX ) 75 MG tablet TAKE 1 TABLET BY MOUTH EVERY DAY 90 tablet 0   desoximetasone (TOPICORT) 0.25 % cream APPLY SPARINGLY TO AFFECTED AREA TWICE A DAY     famotidine  (PEPCID ) 20 MG tablet TAKE 1 TABLET BY MOUTH TWICE A DAY 180 tablet 1   fluticasone (FLONASE) 50 MCG/ACT nasal spray Place 1 spray into both nostrils 2 (two) times daily.     L-Arginine 500 MG TABS Take 500 mg by mouth daily.      levocetirizine (XYZAL) 5 MG tablet Take 5 mg by mouth every evening.     losartan  (COZAAR ) 100 MG tablet TAKE 1 TABLET BY MOUTH EVERY DAY 90 tablet 1   Magnesium 500 MG CAPS Take 500 mg by mouth daily.      Omega-3 Fatty Acids (FISH OIL) 1000 MG CAPS Take 2,000 mg by mouth daily.      polyethylene glycol (MIRALAX / GLYCOLAX) 17 g packet Take 17 g by mouth daily as needed.     predniSONE   (STERAPRED UNI-PAK 21 TAB) 10 MG (21) TBPK tablet Use as directed. 21 each 0   pyridOXINE (VITAMIN B6) 100 MG tablet Take 100 mg by mouth daily.     tiZANidine  (ZANAFLEX ) 4 MG tablet Take 1 tablet (4 mg total) by mouth every 6 (six) hours as needed for muscle spasms. 90 tablet 3   traMADol  (ULTRAM ) 50 MG tablet Take 50 mg by mouth every 12 (twelve) hours as needed for moderate pain.     vitamin B-12 (CYANOCOBALAMIN ) 1000 MCG tablet Take 1,000 mcg by mouth daily.      Zinc 50 MG TABS Take 50 mg by mouth daily.      Current Facility-Administered Medications  Medication Dose Route Frequency Provider Last Rate Last Admin   0.9 %  sodium chloride  infusion  500 mL Intravenous Once Mansouraty, Elizah Lydon Jr., MD        Current Outpatient Medications:    allopurinol  (ZYLOPRIM ) 100 MG tablet, TAKE 2 TABLETS BY MOUTH EVERY DAY, Disp: 180 tablet, Rfl: 3   Ascorbic Acid (VITAMIN C) 1000 MG tablet, Take 1,000 mg by mouth daily., Disp: , Rfl:    atorvastatin  (LIPITOR)  40 MG tablet, TAKE 1 TABLET BY MOUTH DAILY AT 6 PM, Disp: 90 tablet, Rfl: 0   Cholecalciferol (VITAMIN D3) 2000 UNITS TABS, Take 2,000 Units by mouth daily. , Disp: , Rfl:    clopidogrel  (PLAVIX ) 75 MG tablet, TAKE 1 TABLET BY MOUTH EVERY DAY, Disp: 90 tablet, Rfl: 0   desoximetasone (TOPICORT) 0.25 % cream, APPLY SPARINGLY TO AFFECTED AREA TWICE A DAY, Disp: , Rfl:    famotidine  (PEPCID ) 20 MG tablet, TAKE 1 TABLET BY MOUTH TWICE A DAY, Disp: 180 tablet, Rfl: 1   fluticasone (FLONASE) 50 MCG/ACT nasal spray, Place 1 spray into both nostrils 2 (two) times daily., Disp: , Rfl:    L-Arginine 500 MG TABS, Take 500 mg by mouth daily. , Disp: , Rfl:    levocetirizine (XYZAL) 5 MG tablet, Take 5 mg by mouth every evening., Disp: , Rfl:    losartan  (COZAAR ) 100 MG tablet, TAKE 1 TABLET BY MOUTH EVERY DAY, Disp: 90 tablet, Rfl: 1   Magnesium 500 MG CAPS, Take 500 mg by mouth daily. , Disp: , Rfl:    Omega-3 Fatty Acids (FISH OIL) 1000 MG CAPS, Take  2,000 mg by mouth daily. , Disp: , Rfl:    polyethylene glycol (MIRALAX / GLYCOLAX) 17 g packet, Take 17 g by mouth daily as needed., Disp: , Rfl:    predniSONE  (STERAPRED UNI-PAK 21 TAB) 10 MG (21) TBPK tablet, Use as directed., Disp: 21 each, Rfl: 0   pyridOXINE (VITAMIN B6) 100 MG tablet, Take 100 mg by mouth daily., Disp: , Rfl:    tiZANidine  (ZANAFLEX ) 4 MG tablet, Take 1 tablet (4 mg total) by mouth every 6 (six) hours as needed for muscle spasms., Disp: 90 tablet, Rfl: 3   traMADol  (ULTRAM ) 50 MG tablet, Take 50 mg by mouth every 12 (twelve) hours as needed for moderate pain., Disp: , Rfl:    vitamin B-12 (CYANOCOBALAMIN ) 1000 MCG tablet, Take 1,000 mcg by mouth daily. , Disp: , Rfl:    Zinc 50 MG TABS, Take 50 mg by mouth daily. , Disp: , Rfl:   Current Facility-Administered Medications:    0.9 %  sodium chloride  infusion, 500 mL, Intravenous, Once, Mansouraty, Albino Alu., MD No Known Allergies Family History  Problem Relation Age of Onset   Cancer Mother    Melanoma Mother    Colon polyps Father    Colon cancer Neg Hx    Esophageal cancer Neg Hx    Rectal cancer Neg Hx    Stomach cancer Neg Hx    Pancreatic cancer Neg Hx    Social History   Socioeconomic History   Marital status: Married    Spouse name: Not on file   Number of children: Not on file   Years of education: Not on file   Highest education level: Associate degree: academic program  Occupational History   Not on file  Tobacco Use   Smoking status: Never   Smokeless tobacco: Never  Vaping Use   Vaping status: Never Used  Substance and Sexual Activity   Alcohol use: No   Drug use: No   Sexual activity: Not Currently    Birth control/protection: None    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  Other Topics Concern   Not on file  Social History Narrative   Not on file   Social Drivers of Health   Financial Resource Strain: Low Risk  (08/26/2023)   Overall Financial Resource Strain  (CARDIA)  Difficulty of Paying Living Expenses: Not hard at all  Food Insecurity: No Food Insecurity (08/26/2023)   Hunger Vital Sign    Worried About Running Out of Food in the Last Year: Never true    Ran Out of Food in the Last Year: Never true  Transportation Needs: No Transportation Needs (08/26/2023)   PRAPARE - Administrator, Civil Service (Medical): No    Lack of Transportation (Non-Medical): No  Physical Activity: Sufficiently Active (08/26/2023)   Exercise Vital Sign    Days of Exercise per Week: 3 days    Minutes of Exercise per Session: 90 min  Stress: No Stress Concern Present (08/26/2023)   [employer] of Occupational Health - Occupational Stress Questionnaire    Feeling of Stress : Only a little  Social Connections: Socially Integrated (08/26/2023)   Social Connection and Isolation Panel [NHANES]    Frequency of Communication with Friends and Family: Twice a week    Frequency of Social Gatherings with Friends and Family: Once a week    Attends Religious Services: More than 4 times per year    Active Member of [employer] or Organizations: Yes    Attends Engineer, structural: More than 4 times per year    Marital Status: Married  Catering manager Violence: Not At Risk (09/22/2021)   Humiliation, Afraid, Rape, and Kick questionnaire    Fear of Current or Ex-Partner: No    Emotionally Abused: No    Physically Abused: No    Sexually Abused: No    Physical Exam: There were no vitals filed for this visit. There is no height or weight on file to calculate BMI. GEN: NAD EYE: Sclerae anicteric ENT: MMM CV: Non-tachycardic GI: Soft, NT/ND NEURO:  Alert & Oriented x 3  Lab Results: No results for input(s): "WBC", "HGB", "HCT", "PLT" in the last 72 hours. BMET No results for input(s): "NA", "K", "CL", "CO2", "GLUCOSE", "BUN", "CREATININE", "CALCIUM " in the last 72 hours. LFT No results for input(s): "PROT", "ALBUMIN", "AST", "ALT", "ALKPHOS", "BILITOT",  "BILIDIR", "IBILI" in the last 72 hours. PT/INR No results for input(s): "LABPROT", "INR" in the last 72 hours.   Impression / Plan: This is a 78 y.o.male who presents for Colonoscopy for surveillance.  The risks and benefits of endoscopic evaluation/treatment were discussed with the patient and/or family; these include but are not limited to the risk of perforation, infection, bleeding, missed lesions, lack of diagnosis, severe illness requiring hospitalization, as well as anesthesia and sedation related illnesses.  The patient's history has been reviewed, patient examined, no change in status, and deemed stable for procedure.  The patient and/or family is agreeable to proceed.    Yong Henle, MD [facility] Office # [phone]

## 2023-09-25 ENCOUNTER — Telehealth: Payer: Self-pay

## 2023-09-25 NOTE — Telephone Encounter (Signed)
 No answer, left message to call if having any issues or concerns, B.Vale Mousseau RN

## 2023-09-26 LAB — SURGICAL PATHOLOGY

## 2023-09-29 ENCOUNTER — Encounter: Payer: Self-pay | Admitting: Gastroenterology

## 2023-10-07 DIAGNOSIS — Z85820 Personal history of malignant melanoma of skin: Secondary | ICD-10-CM | POA: Diagnosis not present

## 2023-10-07 DIAGNOSIS — L400 Psoriasis vulgaris: Secondary | ICD-10-CM | POA: Diagnosis not present

## 2023-10-07 DIAGNOSIS — L909 Atrophic disorder of skin, unspecified: Secondary | ICD-10-CM | POA: Diagnosis not present

## 2023-10-07 DIAGNOSIS — C44622 Squamous cell carcinoma of skin of right upper limb, including shoulder: Secondary | ICD-10-CM | POA: Diagnosis not present

## 2023-10-07 DIAGNOSIS — L821 Other seborrheic keratosis: Secondary | ICD-10-CM | POA: Diagnosis not present

## 2023-10-07 DIAGNOSIS — L820 Inflamed seborrheic keratosis: Secondary | ICD-10-CM | POA: Diagnosis not present

## 2023-10-07 DIAGNOSIS — D485 Neoplasm of uncertain behavior of skin: Secondary | ICD-10-CM | POA: Diagnosis not present

## 2023-10-07 DIAGNOSIS — L812 Freckles: Secondary | ICD-10-CM | POA: Diagnosis not present

## 2023-10-08 ENCOUNTER — Other Ambulatory Visit: Payer: Self-pay | Admitting: Family Medicine

## 2023-10-09 NOTE — Telephone Encounter (Signed)
 Requested Prescriptions  Pending Prescriptions Disp Refills   atorvastatin  (LIPITOR) 40 MG tablet [Pharmacy Med Name: ATORVASTATIN  40 MG TABLET] 90 tablet 0    Sig: TAKE 1 TABLET BY MOUTH DAILY AT 6 PM     Cardiovascular:  Antilipid - Statins Failed - 10/09/2023  2:24 PM      Failed - Lipid Panel in normal range within the last 12 months    Cholesterol  Date Value Ref Range Status  02/11/2023 130 <200 mg/dL Final   LDL Cholesterol (Calc)  Date Value Ref Range Status  02/11/2023 63 mg/dL (calc) Final    Comment:    Reference range: <100 . Desirable range <100 mg/dL for primary prevention;   <70 mg/dL for patients with CHD or diabetic patients  with > or = 2 CHD risk factors. Aaron Aas LDL-C is now calculated using the Martin-Hopkins  calculation, which is a validated novel method providing  better accuracy than the Friedewald equation in the  estimation of LDL-C.  Melinda Sprawls et al. Erroll Heard. 8119;147(82): 2061-2068  (http://education.[facility].com/faq/FAQ164)    HDL  Date Value Ref Range Status  02/11/2023 52 > OR = 40 mg/dL Final   Triglycerides  Date Value Ref Range Status  02/11/2023 73 <150 mg/dL Final         Passed - Patient is not pregnant      Passed - Valid encounter within last 12 months    Recent Outpatient Visits           1 month ago Seasonal allergies   [place] [facility] Amadeo June, MD   7 months ago Skin tear of upper arm without complication, right, sequela   [place] [facility] Austine Lefort, MD   7 months ago Chronic throat clearing   [place] [facility] Austine Lefort, MD   1 year ago Back muscle spasm   [place] [facility] Cheril Cork, Cisco Crest, MD   1 year ago Chronic cough   [place] [facility] Pickard, Cisco Crest, MD

## 2023-10-12 ENCOUNTER — Other Ambulatory Visit: Payer: Self-pay | Admitting: Family Medicine

## 2023-11-09 ENCOUNTER — Other Ambulatory Visit (INDEPENDENT_AMBULATORY_CARE_PROVIDER_SITE_OTHER): Payer: Self-pay | Admitting: Otolaryngology

## 2023-11-11 DIAGNOSIS — M5416 Radiculopathy, lumbar region: Secondary | ICD-10-CM | POA: Diagnosis not present

## 2023-12-05 ENCOUNTER — Other Ambulatory Visit: Payer: Self-pay | Admitting: Family Medicine

## 2023-12-06 NOTE — Telephone Encounter (Signed)
 Requested medication (s) are due for refill today - yes  Requested medication (s) are on the active medication list -yes  Future visit scheduled -no  Last refill: 08/29/23 #90  Notes to clinic: fails lab protocol- required every 6 months- 02/11/23  Requested Prescriptions  Pending Prescriptions Disp Refills   clopidogrel  (PLAVIX ) 75 MG tablet [Pharmacy Med Name: CLOPIDOGREL  75 MG TABLET] 90 tablet 0    Sig: TAKE 1 TABLET BY MOUTH EVERY DAY     Hematology: Antiplatelets - clopidogrel  Failed - 12/06/2023 11:35 AM      Failed - HCT in normal range and within 180 days    HCT  Date Value Ref Range Status  02/11/2023 44.3 38.5 - 50.0 % Final         Failed - HGB in normal range and within 180 days    Hemoglobin  Date Value Ref Range Status  02/11/2023 14.8 13.2 - 17.1 g/dL Final         Failed - PLT in normal range and within 180 days    Platelets  Date Value Ref Range Status  02/11/2023 172 140 - 400 Thousand/uL Final         Failed - Valid encounter within last 6 months    Recent Outpatient Visits           3 months ago Seasonal allergies   [place] [facility] Aletha Bene, MD   8 months ago Skin tear of upper arm without complication, right, sequela   [place] [facility] Duanne, Butler DASEN, MD   9 months ago Chronic throat clearing   [place] [facility] Duanne Butler DASEN, MD   1 year ago Back muscle spasm   [place] [facility] Duanne Butler DASEN, MD   1 year ago Chronic cough   [place] [facility] Pickard, Butler DASEN, MD              Passed - Cr in normal range and within 360 days    Creat  Date Value Ref Range Status  02/11/2023 1.06 0.70 - 1.28 mg/dL Final            Requested Prescriptions  Pending Prescriptions Disp Refills   clopidogrel  (PLAVIX ) 75 MG tablet [Pharmacy Med Name: CLOPIDOGREL  75 MG TABLET] 90 tablet 0    Sig: TAKE 1 TABLET BY MOUTH  EVERY DAY     Hematology: Antiplatelets - clopidogrel  Failed - 12/06/2023 11:35 AM      Failed - HCT in normal range and within 180 days    HCT  Date Value Ref Range Status  02/11/2023 44.3 38.5 - 50.0 % Final         Failed - HGB in normal range and within 180 days    Hemoglobin  Date Value Ref Range Status  02/11/2023 14.8 13.2 - 17.1 g/dL Final         Failed - PLT in normal range and within 180 days    Platelets  Date Value Ref Range Status  02/11/2023 172 140 - 400 Thousand/uL Final         Failed - Valid encounter within last 6 months    Recent Outpatient Visits           3 months ago Seasonal allergies    [facility] Aletha Bene, MD   8 months ago Skin tear of upper arm without complication, right, sequela  [place] [facility] Duanne, Butler DASEN, MD   9 months ago Chronic throat clearing   [place] [facility] Duanne, Butler DASEN, MD   1 year ago Back muscle spasm   [place] [facility] Duanne, Butler DASEN, MD   1 year ago Chronic cough   [place] [facility] Pickard, Butler DASEN, MD              Passed - Cr in normal range and within 360 days    Creat  Date Value Ref Range Status  02/11/2023 1.06 0.70 - 1.28 mg/dL Final

## 2024-01-07 ENCOUNTER — Other Ambulatory Visit: Payer: Self-pay | Admitting: Family Medicine

## 2024-01-09 NOTE — Telephone Encounter (Signed)
 Requested Prescriptions  Pending Prescriptions Disp Refills   atorvastatin  (LIPITOR) 40 MG tablet [Pharmacy Med Name: ATORVASTATIN  40 MG TABLET] 90 tablet 0    Sig: TAKE 1 TABLET BY MOUTH DAILY AT 6PM     Cardiovascular:  Antilipid - Statins Failed - 01/09/2024  8:31 AM      Failed - Lipid Panel in normal range within the last 12 months    Cholesterol  Date Value Ref Range Status  02/11/2023 130 <200 mg/dL Final   LDL Cholesterol (Calc)  Date Value Ref Range Status  02/11/2023 63 mg/dL (calc) Final    Comment:    Reference range: <100 . Desirable range <100 mg/dL for primary prevention;   <70 mg/dL for patients with CHD or diabetic patients  with > or = 2 CHD risk factors. SABRA LDL-C is now calculated using the Martin-Hopkins  calculation, which is a validated novel method providing  better accuracy than the Friedewald equation in the  estimation of LDL-C.  Gladis APPLETHWAITE et al. SANDREA. 7986;689(80): 2061-2068  (http://education.[facility].com/faq/FAQ164)    HDL  Date Value Ref Range Status  02/11/2023 52 > OR = 40 mg/dL Final   Triglycerides  Date Value Ref Range Status  02/11/2023 73 <150 mg/dL Final         Passed - Patient is not pregnant      Passed - Valid encounter within last 12 months    Recent Outpatient Visits           4 months ago Seasonal allergies   [place] [facility] Aletha Bene, MD   10 months ago Skin tear of upper arm without complication, right, sequela   [place] [facility] Duanne Butler DASEN, MD   10 months ago Chronic throat clearing   [place] [facility] Duanne Butler DASEN, MD   1 year ago Back muscle spasm   [place] [facility] Duanne, Butler DASEN, MD   1 year ago Chronic cough   [place] [facility] Pickard, Butler DASEN, MD

## 2024-02-12 DIAGNOSIS — M5416 Radiculopathy, lumbar region: Secondary | ICD-10-CM | POA: Diagnosis not present

## 2024-02-28 ENCOUNTER — Other Ambulatory Visit: Payer: Self-pay | Admitting: Family Medicine

## 2024-04-01 ENCOUNTER — Other Ambulatory Visit

## 2024-04-01 DIAGNOSIS — I639 Cerebral infarction, unspecified: Secondary | ICD-10-CM

## 2024-04-01 DIAGNOSIS — E7800 Pure hypercholesterolemia, unspecified: Secondary | ICD-10-CM

## 2024-04-01 DIAGNOSIS — I10 Essential (primary) hypertension: Secondary | ICD-10-CM

## 2024-04-01 DIAGNOSIS — E291 Testicular hypofunction: Secondary | ICD-10-CM | POA: Diagnosis not present

## 2024-04-01 LAB — COMPLETE METABOLIC PANEL WITHOUT GFR
AG Ratio: 1.8 (calc) (ref 1.0–2.5)
ALT: 21 U/L (ref 9–46)
AST: 28 U/L (ref 10–35)
Albumin: 3.8 g/dL (ref 3.6–5.1)
Alkaline phosphatase (APISO): 76 U/L (ref 35–144)
BUN: 11 mg/dL (ref 7–25)
CO2: 29 mmol/L (ref 20–32)
Calcium: 8.9 mg/dL (ref 8.6–10.3)
Chloride: 105 mmol/L (ref 98–110)
Creat: 0.94 mg/dL (ref 0.70–1.28)
Globulin: 2.1 g/dL (ref 1.9–3.7)
Glucose, Bld: 97 mg/dL (ref 65–99)
Potassium: 4 mmol/L (ref 3.5–5.3)
Sodium: 140 mmol/L (ref 135–146)
Total Bilirubin: 1.1 mg/dL (ref 0.2–1.2)
Total Protein: 5.9 g/dL — ABNORMAL LOW (ref 6.1–8.1)

## 2024-04-01 LAB — CBC WITH DIFFERENTIAL/PLATELET
Absolute Lymphocytes: 3196 {cells}/uL (ref 850–3900)
Absolute Monocytes: 764 {cells}/uL (ref 200–950)
Basophils Absolute: 60 {cells}/uL (ref 0–200)
Basophils Relative: 0.9 %
Eosinophils Absolute: 228 {cells}/uL (ref 15–500)
Eosinophils Relative: 3.4 %
HCT: 42.9 % (ref 38.5–50.0)
Hemoglobin: 14.6 g/dL (ref 13.2–17.1)
MCH: 33.3 pg — ABNORMAL HIGH (ref 27.0–33.0)
MCHC: 34 g/dL (ref 32.0–36.0)
MCV: 97.9 fL (ref 80.0–100.0)
MPV: 11.5 fL (ref 7.5–12.5)
Monocytes Relative: 11.4 %
Neutro Abs: 2452 {cells}/uL (ref 1500–7800)
Neutrophils Relative %: 36.6 %
Platelets: 162 Thousand/uL (ref 140–400)
RBC: 4.38 Million/uL (ref 4.20–5.80)
RDW: 12.4 % (ref 11.0–15.0)
Total Lymphocyte: 47.7 %
WBC: 6.7 Thousand/uL (ref 3.8–10.8)

## 2024-04-01 LAB — LIPID PANEL
Cholesterol: 122 mg/dL (ref <200)
HDL: 50 mg/dL (ref >=40)
LDL Cholesterol (Calc): 55 mg/dL
Non-HDL Cholesterol (Calc): 72 mg/dL (ref <130)
Total CHOL/HDL Ratio: 2.4 (calc) (ref <5.0)
Triglycerides: 85 mg/dL (ref <150)

## 2024-04-02 ENCOUNTER — Encounter (INDEPENDENT_AMBULATORY_CARE_PROVIDER_SITE_OTHER): Payer: Self-pay

## 2024-04-02 ENCOUNTER — Ambulatory Visit: Payer: Self-pay | Admitting: Family Medicine

## 2024-04-08 DIAGNOSIS — L821 Other seborrheic keratosis: Secondary | ICD-10-CM | POA: Diagnosis not present

## 2024-04-08 DIAGNOSIS — D485 Neoplasm of uncertain behavior of skin: Secondary | ICD-10-CM | POA: Diagnosis not present

## 2024-04-08 DIAGNOSIS — L820 Inflamed seborrheic keratosis: Secondary | ICD-10-CM | POA: Diagnosis not present

## 2024-04-08 DIAGNOSIS — D045 Carcinoma in situ of skin of trunk: Secondary | ICD-10-CM | POA: Diagnosis not present

## 2024-04-08 DIAGNOSIS — L400 Psoriasis vulgaris: Secondary | ICD-10-CM | POA: Diagnosis not present

## 2024-04-08 DIAGNOSIS — Z85820 Personal history of malignant melanoma of skin: Secondary | ICD-10-CM | POA: Diagnosis not present

## 2024-04-09 ENCOUNTER — Encounter: Payer: Self-pay | Admitting: Family Medicine

## 2024-04-09 ENCOUNTER — Ambulatory Visit: Admitting: Family Medicine

## 2024-04-09 VITALS — BP 142/90 | HR 85 | Temp 98.5°F | Ht 71.0 in | Wt 229.6 lb

## 2024-04-09 DIAGNOSIS — Z125 Encounter for screening for malignant neoplasm of prostate: Secondary | ICD-10-CM | POA: Diagnosis not present

## 2024-04-09 DIAGNOSIS — E7800 Pure hypercholesterolemia, unspecified: Secondary | ICD-10-CM

## 2024-04-09 DIAGNOSIS — I10 Essential (primary) hypertension: Secondary | ICD-10-CM

## 2024-04-09 DIAGNOSIS — Z23 Encounter for immunization: Secondary | ICD-10-CM

## 2024-04-09 DIAGNOSIS — Z0000 Encounter for general adult medical examination without abnormal findings: Secondary | ICD-10-CM

## 2024-04-09 DIAGNOSIS — I639 Cerebral infarction, unspecified: Secondary | ICD-10-CM

## 2024-04-09 DIAGNOSIS — R3121 Asymptomatic microscopic hematuria: Secondary | ICD-10-CM | POA: Diagnosis not present

## 2024-04-09 NOTE — Progress Notes (Signed)
 Subjective:    Patient ID: Chad Serrano, male    DOB: 1945/12/16, 78 y.o.   MRN: [id number]  Patient is a 78 year old Caucasian gentleman who presents today for complete physical exam.  He is due for a flu shot.  He is due for the second shingles vaccine but he prefers to get this at his pharmacy.  He is due for RSV and COVID but he declines these.  His pneumonia shot is up-to-date.  The patient had a colonoscopy this year.  There was tubular adenomas.  His gastroenterologist recommended a repeat colonoscopy in 3 years.  He is due for prostate cancer screening.  However a PSA was not obtained on his lab work.  We discussed this.  I typically do not recommend a PSA above the age of 75 but the patient is interested in getting this done.  He also reports anxiety on a daily basis.  He states that he becomes easily frustrated and talking with his wife.  He states that she is a contractor for details and that she constantly asking questions.  This causes him to begin to worry.  At times he has to walk away.  However even after walking away he feels anxious.  He denies any depression.  He denies any trouble in the marriage.  He denies any suicidal thoughts.  He denies any depression.   Lab on 04/01/2024  Component Date Value Ref Range Status   WBC 04/01/2024 6.7  3.8 - 10.8 Thousand/uL Final   RBC 04/01/2024 4.38  4.20 - 5.80 Million/uL Final   Hemoglobin 04/01/2024 14.6  13.2 - 17.1 g/dL Final   HCT 88/87/7974 42.9  38.5 - 50.0 % Final   MCV 04/01/2024 97.9  80.0 - 100.0 fL Final   MCH 04/01/2024 33.3 (H)  27.0 - 33.0 pg Final   MCHC 04/01/2024 34.0  32.0 - 36.0 g/dL Final   Comment: For adults, a slight decrease in the calculated MCHC value (in the range of 30 to 32 g/dL) is most likely not clinically significant; however, it should be interpreted with caution in correlation with other red cell parameters and the patient's clinical condition.    RDW 04/01/2024 12.4  11.0 - 15.0 % Final    Platelets 04/01/2024 162  140 - 400 Thousand/uL Final   MPV 04/01/2024 11.5  7.5 - 12.5 fL Final   Neutro Abs 04/01/2024 2,452  1,500 - 7,800 cells/uL Final   Absolute Lymphocytes 04/01/2024 3,196  850 - 3,900 cells/uL Final   Absolute Monocytes 04/01/2024 764  200 - 950 cells/uL Final   Eosinophils Absolute 04/01/2024 228  15 - 500 cells/uL Final   Basophils Absolute 04/01/2024 60  0 - 200 cells/uL Final   Neutrophils Relative % 04/01/2024 36.6  % Final   Total Lymphocyte 04/01/2024 47.7  % Final   Monocytes Relative 04/01/2024 11.4  % Final   Eosinophils Relative 04/01/2024 3.4  % Final   Basophils Relative 04/01/2024 0.9  % Final   Glucose, Bld 04/01/2024 97  65 - 99 mg/dL Final   Comment: .            Fasting reference interval .    BUN 04/01/2024 11  7 - 25 mg/dL Final   Creat 88/87/7974 0.94  0.70 - 1.28 mg/dL Final   BUN/Creatinine Ratio 04/01/2024 SEE NOTE:  6 - 22 (calc) Final   Comment:    Not Reported: BUN and Creatinine are within    reference range. SABRA  Sodium 04/01/2024 140  135 - 146 mmol/L Final   Potassium 04/01/2024 4.0  3.5 - 5.3 mmol/L Final   Chloride 04/01/2024 105  98 - 110 mmol/L Final   CO2 04/01/2024 29  20 - 32 mmol/L Final   Calcium  04/01/2024 8.9  8.6 - 10.3 mg/dL Final   Total Protein 88/87/7974 5.9 (L)  6.1 - 8.1 g/dL Final   Albumin 88/87/7974 3.8  3.6 - 5.1 g/dL Final   Globulin 88/87/7974 2.1  1.9 - 3.7 g/dL (calc) Final   AG Ratio 04/01/2024 1.8  1.0 - 2.5 (calc) Final   Total Bilirubin 04/01/2024 1.1  0.2 - 1.2 mg/dL Final   Alkaline phosphatase (APISO) 04/01/2024 76  35 - 144 U/L Final   AST 04/01/2024 28  10 - 35 U/L Final   ALT 04/01/2024 21  9 - 46 U/L Final   Cholesterol 04/01/2024 122  <200 mg/dL Final   HDL 88/87/7974 50  > OR = 40 mg/dL Final   Triglycerides 88/87/7974 85  <150 mg/dL Final   LDL Cholesterol (Calc) 04/01/2024 55  mg/dL (calc) Final   Comment: Reference range: <100 . Desirable range <100 mg/dL for primary  prevention;   <70 mg/dL for patients with CHD or diabetic patients  with > or = 2 CHD risk factors. SABRA LDL-C is now calculated using the Martin-Hopkins  calculation, which is a validated novel method providing  better accuracy than the Friedewald equation in the  estimation of LDL-C.  Gladis APPLETHWAITE et al. SANDREA. 7986;689(80): 2061-2068  (http://education.[facility].com/faq/FAQ164)    Total CHOL/HDL Ratio 04/01/2024 2.4  <4.9 (calc) Final   Non-HDL Cholesterol (Calc) 04/01/2024 72  <130 mg/dL (calc) Final   Comment: For patients with diabetes plus 1 major ASCVD risk  factor, treating to a non-HDL-C goal of <100 mg/dL  (LDL-C of <29 mg/dL) is considered a therapeutic  option.     Past Medical History:  Diagnosis Date   Anxiety    Chronic kidney disease    Gout    Hyperlipidemia    Hypertension    Hypogonadism male    Hypogonadism male    Melanoma (HCC)    at T4 surgically excised at [facility] (2020) free margins and sentinel node biopsy negative   Melanoma (HCC)    Stroke (HCC)    cryptogenic, right retinal artery occlusion   Past Surgical History:  Procedure Laterality Date   APPENDECTOMY     CHOLECYSTECTOMY  06/14/2020   COLONOSCOPY     GALLBLADDER SURGERY  05/2020   ROTATOR CUFF REPAIR Right    Current Outpatient Medications on File Prior to Visit  Medication Sig Dispense Refill   allopurinol  (ZYLOPRIM ) 100 MG tablet TAKE 2 TABLETS BY MOUTH EVERY DAY 180 tablet 3   Ascorbic Acid (VITAMIN C) 1000 MG tablet Take 1,000 mg by mouth daily.     atorvastatin  (LIPITOR) 40 MG tablet TAKE 1 TABLET BY MOUTH DAILY AT 6PM 90 tablet 0   Cholecalciferol (VITAMIN D3) 2000 UNITS TABS Take 2,000 Units by mouth daily.      clopidogrel  (PLAVIX ) 75 MG tablet TAKE 1 TABLET BY MOUTH EVERY DAY 90 tablet 0   desoximetasone (TOPICORT) 0.25 % cream APPLY SPARINGLY TO AFFECTED AREA TWICE A DAY     famotidine  (PEPCID ) 20 MG tablet TAKE 1 TABLET BY MOUTH TWICE A DAY 180 tablet 1   fluticasone  (FLONASE) 50 MCG/ACT nasal spray Place 1 spray into both nostrils 2 (two) times daily.     L-Arginine 500 MG  TABS Take 500 mg by mouth daily.      levocetirizine (XYZAL) 5 MG tablet Take 5 mg by mouth every evening.     losartan  (COZAAR ) 100 MG tablet TAKE 1 TABLET BY MOUTH EVERY DAY 90 tablet 1   Magnesium 500 MG CAPS Take 500 mg by mouth daily.      Omega-3 Fatty Acids (FISH OIL) 1000 MG CAPS Take 2,000 mg by mouth daily.      polyethylene glycol (MIRALAX / GLYCOLAX) 17 g packet Take 17 g by mouth daily as needed.     pyridOXINE (VITAMIN B6) 100 MG tablet Take 100 mg by mouth daily.     tiZANidine  (ZANAFLEX ) 4 MG tablet Take 1 tablet (4 mg total) by mouth every 6 (six) hours as needed for muscle spasms. 90 tablet 3   traMADol  (ULTRAM ) 50 MG tablet Take 50 mg by mouth every 12 (twelve) hours as needed for moderate pain.     vitamin B-12 (CYANOCOBALAMIN ) 1000 MCG tablet Take 1,000 mcg by mouth daily.      Zinc 50 MG TABS Take 50 mg by mouth daily.      No current facility-administered medications on file prior to visit.   No Known Allergies Social History   Socioeconomic History   Marital status: Married    Spouse name: Not on file   Number of children: Not on file   Years of education: Not on file   Highest education level: Bachelor's degree (e.g., BA, AB, BS)  Occupational History   Not on file  Tobacco Use   Smoking status: Never   Smokeless tobacco: Never  Vaping Use   Vaping status: Never Used  Substance and Sexual Activity   Alcohol use: No   Drug use: No   Sexual activity: Not Currently    Birth control/protection: None    Comment: married to Edroy.  Retired.  Weightlifter  Other Topics Concern   Not on file  Social History Narrative   Not on file   Social Drivers of Health   Financial Resource Strain: Low Risk  (04/05/2024)   Overall Financial Resource Strain (CARDIA)    Difficulty of Paying Living Expenses: Not hard at all  Food Insecurity: No Food Insecurity  (04/05/2024)   Hunger Vital Sign    Worried About Running Out of Food in the Last Year: Never true    Ran Out of Food in the Last Year: Never true  Transportation Needs: No Transportation Needs (04/05/2024)   PRAPARE - Administrator, Civil Service (Medical): No    Lack of Transportation (Non-Medical): No  Physical Activity: Sufficiently Active (04/05/2024)   Exercise Vital Sign    Days of Exercise per Week: 3 days    Minutes of Exercise per Session: 90 min  Stress: Stress Concern Present (04/05/2024)   [employer] of Occupational Health - Occupational Stress Questionnaire    Feeling of Stress: To some extent  Social Connections: Socially Integrated (04/05/2024)   Social Connection and Isolation Panel    Frequency of Communication with Friends and Family: More than three times a week    Frequency of Social Gatherings with Friends and Family: Twice a week    Attends Religious Services: More than 4 times per year    Active Member of [employer] or Organizations: Yes    Attends Banker Meetings: More than 4 times per year    Marital Status: Married  Catering Manager Violence: Not At Risk (09/22/2021)   Humiliation, Afraid,  Rape, and Kick questionnaire    Fear of Current or Ex-Partner: No    Emotionally Abused: No    Physically Abused: No    Sexually Abused: No   Family History  Problem Relation Age of Onset   Cancer Mother    Melanoma Mother    Colon polyps Father    Colon cancer Neg Hx    Esophageal cancer Neg Hx    Rectal cancer Neg Hx    Stomach cancer Neg Hx    Pancreatic cancer Neg Hx       Review of Systems  All other systems reviewed and are negative.      Objective:   Physical Exam Vitals reviewed.  Constitutional:      General: He is not in acute distress.    Appearance: Normal appearance. He is not ill-appearing, toxic-appearing or diaphoretic.  HENT:     Head: Normocephalic and atraumatic.     Right Ear: Tympanic membrane and ear  canal normal. There is no impacted cerumen.     Left Ear: Tympanic membrane and ear canal normal. There is no impacted cerumen.     Nose: No congestion or rhinorrhea.     Mouth/Throat:     Mouth: Mucous membranes are moist.     Pharynx: Oropharynx is clear. No oropharyngeal exudate or posterior oropharyngeal erythema.  Eyes:     General: No scleral icterus.       Right eye: No discharge.        Left eye: No discharge.     Extraocular Movements: Extraocular movements intact.     Conjunctiva/sclera: Conjunctivae normal.     Pupils: Pupils are equal, round, and reactive to light.  Neck:     Vascular: No carotid bruit.  Cardiovascular:     Rate and Rhythm: Normal rate and regular rhythm.     Heart sounds: No murmur heard.    No friction rub. No gallop.  Pulmonary:     Effort: Pulmonary effort is normal. No respiratory distress.     Breath sounds: Normal breath sounds. No stridor. No wheezing, rhonchi or rales.  Chest:     Chest wall: No tenderness.  Abdominal:     General: Abdomen is flat. Bowel sounds are normal. There is no distension.     Palpations: Abdomen is soft.     Tenderness: There is no abdominal tenderness. There is no right CVA tenderness, left CVA tenderness, guarding or rebound.     Hernia: No hernia is present.  Musculoskeletal:     Cervical back: Normal range of motion and neck supple. No rigidity or tenderness.     Right lower leg: No edema.     Left lower leg: No edema.  Lymphadenopathy:     Cervical: No cervical adenopathy.  Skin:    General: Skin is warm.     Coloration: Skin is not jaundiced or pale.     Findings: No bruising, erythema, lesion or rash.  Neurological:     General: No focal deficit present.     Mental Status: He is alert and oriented to person, place, and time. Mental status is at baseline.     Cranial Nerves: No cranial nerve deficit.     Sensory: No sensory deficit.     Motor: No weakness.     Coordination: Coordination normal.      Gait: Gait normal.     Deep Tendon Reflexes: Reflexes normal.  Psychiatric:        Mood and Affect: Mood  normal.        Behavior: Behavior normal.        Thought Content: Thought content normal.        Judgment: Judgment normal.       Assessment & Plan:  Flu vaccine need - Plan: Flu vaccine HIGH DOSE PF(Fluzone Trivalent)  General medical exam  Primary hypertension  Cryptogenic stroke [facility])  Pure hypercholesterolemia Patient has a history of hypertension.  His blood pressure today is slightly elevated at 142/90 however given his age, I believe that this is acceptable.  He has a history of cryptogenic stroke.  Therefore I would like to keep his LDL cholesterol less than 70.  His LDL cholesterol is less than 70 and at goal.  He is currently on Plavix  as an antiplatelet agent to help prevent future strokes.  He received his flu shot today.  I recommended the second shingles vaccine.  He declines RSV and COVID.  Colonoscopy is up-to-date.  We have plan to check a PSA however during our discussion both and the patient forgot and he left.  He can come back in anytime to get the PSA drawn.  We discussed generalized anxiety disorder.  We discussed whether he would benefit from Lexapro.  I am not convinced that he is dealing with anxiety is much as frustration related to stress that occurs when he has difficult conversations with his wife.  We discussed trying the Lexapro but at the present time he declines this.

## 2024-04-10 ENCOUNTER — Ambulatory Visit: Payer: Self-pay | Admitting: Family Medicine

## 2024-04-10 LAB — PSA: PSA: 0.63 ng/mL (ref <=4.00)

## 2024-04-22 ENCOUNTER — Other Ambulatory Visit: Payer: Self-pay | Admitting: Family Medicine

## 2024-04-22 ENCOUNTER — Telehealth: Payer: Self-pay

## 2024-04-22 NOTE — Telephone Encounter (Signed)
 Prescription Request  04/22/2024  LOV: 04/09/24  What is the name of the medication or equipment? clopidogrel  (PLAVIX ) 75 MG tablet [[id number]]   Have you contacted your pharmacy to request a refill? Yes   Which pharmacy would you like this sent to?  [id number] [address], [place] - [address] [place] [address] Phone: [phone] Fax: [phone]    Patient notified that their request is being sent to the clinical staff for review and that they should receive a response within 2 business days.   Please advise at [facility] [phone]

## 2024-04-24 NOTE — Telephone Encounter (Signed)
 Requested Prescriptions  Pending Prescriptions Disp Refills   losartan  (COZAAR ) 100 MG tablet [Pharmacy Med Name: LOSARTAN  POTASSIUM 100 MG TAB] 90 tablet 1    Sig: TAKE 1 TABLET BY MOUTH EVERY DAY     Cardiovascular:  Angiotensin Receptor Blockers Failed - 04/24/2024  2:00 PM      Failed - Last BP in normal range    BP Readings from Last 1 Encounters:  04/09/24 (!) 142/90         Passed - Cr in normal range and within 180 days    Creat  Date Value Ref Range Status  04/01/2024 0.94 0.70 - 1.28 mg/dL Final         Passed - K in normal range and within 180 days    Potassium  Date Value Ref Range Status  04/01/2024 4.0 3.5 - 5.3 mmol/L Final         Passed - Patient is not pregnant      Passed - Valid encounter within last 6 months    Recent Outpatient Visits           2 weeks ago Flu vaccine need   [facility] Duanne Butler DASEN, MD   8 months ago Seasonal allergies   [place] [facility] Aletha Bene, MD   1 year ago Skin tear of upper arm without complication, right, sequela   [place] Duanne Butler DASEN, MD   1 year ago Chronic throat clearing   [place] [facility] Duanne Butler DASEN, MD   1 year ago Back muscle spasm   [place] [facility] Pickard, Butler DASEN, MD

## 2024-04-25 MED ORDER — CLOPIDOGREL BISULFATE 75 MG PO TABS: 75.0000 mg | ORAL_TABLET | Freq: Every day | ORAL | 2 refills | Status: AC

## 2024-04-25 NOTE — Telephone Encounter (Signed)
 Requested Prescriptions  Pending Prescriptions Disp Refills   clopidogrel  (PLAVIX ) 75 MG tablet 90 tablet 2    Sig: Take 1 tablet (75 mg total) by mouth daily.     Hematology: Antiplatelets - clopidogrel  Passed - 04/25/2024  8:50 AM      Passed - HCT in normal range and within 180 days    HCT  Date Value Ref Range Status  04/01/2024 42.9 38.5 - 50.0 % Final         Passed - HGB in normal range and within 180 days    Hemoglobin  Date Value Ref Range Status  04/01/2024 14.6 13.2 - 17.1 g/dL Final         Passed - PLT in normal range and within 180 days    Platelets  Date Value Ref Range Status  04/01/2024 162 140 - 400 Thousand/uL Final         Passed - Cr in normal range and within 360 days    Creat  Date Value Ref Range Status  04/01/2024 0.94 0.70 - 1.28 mg/dL Final         Passed - Valid encounter within last 6 months    Recent Outpatient Visits           2 weeks ago Flu vaccine need   [place] [facility] Duanne Butler DASEN, MD   8 months ago Seasonal allergies   [place] [facility] Aletha Bene, MD   1 year ago Skin tear of upper arm without complication, right, sequela   [facility] Duanne Butler DASEN, MD   1 year ago Chronic throat clearing   [place] [facility] Duanne Butler DASEN, MD   1 year ago Back muscle spasm    [facility] Pickard, Butler DASEN, MD

## 2024-05-22 ENCOUNTER — Ambulatory Visit: Payer: Self-pay | Admitting: *Deleted

## 2024-05-22 NOTE — Telephone Encounter (Signed)
 Summary: Fall on 05/20/24- Right knee internal issues with pressure   Reason for Triage: The patient called in stating he fell on his driveway on 05/20/24 and states although he is not in pain he feels like he may have some internal issues with his right knee. He says it feels the best when he is walking but it does bother him when he tries to get up and put pressure on it. He would like to discuss this with a nurse as he has an appointment with his provider on Monday just wants to make sure there is nothing else he can do in the mean time. Please assist patient further.     Attempted to contact patient regarding knee injury/symptoms- no answer- left message to call office

## 2024-05-22 NOTE — Telephone Encounter (Signed)
 FYI Only or Action Required?: FYI only for provider: appointment scheduled on 05/25/24.  Patient was last seen in primary care on 04/09/2024 by Duanne Butler DASEN, MD.  Called Nurse Triage reporting Knee Injury.  Symptoms began 3 days ago.  Interventions attempted: OTC medications: Tylenol , Rest, compression, and Ice application.  Symptoms are: unchanged.  Triage Disposition: See PCP When Office is Open (Within 3 Days)  Patient/caregiver understands and will follow disposition?: Yes   Reason for Disposition  [1] Limp when walking AND [2] due to a direct blow  Answer Assessment - Initial Assessment Questions Patient has an appointment on 04/24/24 to evaluated knee injury, calling today for home care advise in the meantime.  HPI:  On 05/20/24 he was bent over picking up sticks in driveway when he lost balance and landed on his right knee, braced self from falling with left hand. Had very mild left shoulder pain after fall. Right knee pain and slight edema is persisting. Denies cuts, bruising. No dizziness or chest pain. Denies all other symptoms. He has been treating with rest, ice, compression.   1. MECHANISM: How did the injury happen? (e.g., twisting injury, direct blow)      fall 2. ONSET: When did the injury happen? (e.g., minutes, hours ago)      05/20/24 3. LOCATION: Where is the injury located?      Right knee  4. APPEARANCE of INJURY: What does the injury look like?      Very slight swelling  5. SEVERITY: Can you put weight on that leg? Can you walk?      Able to walk  6. SIZE: For cuts, bruises, or swelling, ask: How large is it? (e.g., inches or centimeters;  entire joint)      Very mild swelling  7. PAIN: Is there pain? If Yes, ask: How bad is the pain?   What does it keep you from doing? (Scale 0-10; or none, mild, moderate, severe)     Hobbling (limping) around, Tylenol   8. TETANUS: For any breaks in the skin, ask: When was your last tetanus  booster?      9. OTHER SYMPTOMS: Do you have any other symptoms?  (e.g., pop when knee injured, swelling, locking, buckling)      Denies  10. PREGNANCY: Is there any chance you are pregnant? When was your last menstrual period?  Protocols used: Knee Injury-A-AH

## 2024-05-25 ENCOUNTER — Ambulatory Visit (INDEPENDENT_AMBULATORY_CARE_PROVIDER_SITE_OTHER): Admitting: Family Medicine

## 2024-05-25 VITALS — BP 132/82 | HR 82 | Ht 71.0 in | Wt 225.0 lb

## 2024-05-25 DIAGNOSIS — S8001XA Contusion of right knee, initial encounter: Secondary | ICD-10-CM

## 2024-05-25 NOTE — Progress Notes (Signed)
 "  Subjective:    Patient ID: Chad Serrano, male    DOB: 10/06/1945, 79 y.o.   MRN: [id number]  HPI Patient fell December 31 and landed on his right patella against concrete.  He is able to stand and walk immediately afterwards without pain.  However the following day, he reported soreness around the knee.  He denies any instability in the knee now.  He denies any pain with ambulation.  There is no tenderness to palpation over the patella.  There is no tenderness to palpation over the insertion of the quadriceps tendon on the tibia.  He has no laxity to varus or valgus stress.  He has a negative anterior and posterior drawer sign.  He has a negative Apley grind.  There is no evidence of any ligamentous instability in the knee.  He denies any significant pain today Past Medical History:  Diagnosis Date   Anxiety    Chronic kidney disease    Gout    Hyperlipidemia    Hypertension    Hypogonadism male    Hypogonadism male    Melanoma (HCC)    at T4 surgically excised at [facility] (2020) free margins and sentinel node biopsy negative   Melanoma (HCC)    Stroke (HCC)    cryptogenic, right retinal artery occlusion   Past Surgical History:  Procedure Laterality Date   APPENDECTOMY     CHOLECYSTECTOMY  06/14/2020   COLONOSCOPY     GALLBLADDER SURGERY  05/2020   ROTATOR CUFF REPAIR Right    Current Outpatient Medications on File Prior to Visit  Medication Sig Dispense Refill   allopurinol  (ZYLOPRIM ) 100 MG tablet TAKE 2 TABLETS BY MOUTH EVERY DAY 180 tablet 3   Ascorbic Acid (VITAMIN C) 1000 MG tablet Take 1,000 mg by mouth daily.     atorvastatin  (LIPITOR) 40 MG tablet TAKE 1 TABLET BY MOUTH DAILY AT 6PM 90 tablet 0   Cholecalciferol (VITAMIN D3) 2000 UNITS TABS Take 2,000 Units by mouth daily.      clopidogrel  (PLAVIX ) 75 MG tablet Take 1 tablet (75 mg total) by mouth daily. 90 tablet 2   desoximetasone (TOPICORT) 0.25 % cream APPLY SPARINGLY TO AFFECTED AREA TWICE A DAY      famotidine  (PEPCID ) 20 MG tablet TAKE 1 TABLET BY MOUTH TWICE A DAY 180 tablet 1   fluticasone (FLONASE) 50 MCG/ACT nasal spray Place 1 spray into both nostrils 2 (two) times daily.     L-Arginine 500 MG TABS Take 500 mg by mouth daily.      levocetirizine (XYZAL) 5 MG tablet Take 5 mg by mouth every evening.     losartan  (COZAAR ) 100 MG tablet TAKE 1 TABLET BY MOUTH EVERY DAY 90 tablet 1   Magnesium 500 MG CAPS Take 500 mg by mouth daily.      Omega-3 Fatty Acids (FISH OIL) 1000 MG CAPS Take 2,000 mg by mouth daily.      polyethylene glycol (MIRALAX / GLYCOLAX) 17 g packet Take 17 g by mouth daily as needed.     pyridOXINE (VITAMIN B6) 100 MG tablet Take 100 mg by mouth daily.     tiZANidine  (ZANAFLEX ) 4 MG tablet Take 1 tablet (4 mg total) by mouth every 6 (six) hours as needed for muscle spasms. 90 tablet 3   traMADol  (ULTRAM ) 50 MG tablet Take 50 mg by mouth every 12 (twelve) hours as needed for moderate pain.     vitamin B-12 (CYANOCOBALAMIN ) 1000 MCG tablet Take 1,000 mcg by  mouth daily.      Zinc 50 MG TABS Take 50 mg by mouth daily.      No current facility-administered medications on file prior to visit.   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  Other Topics Concern   Not on file  Social History Narrative   Not on file   Social Drivers of Health   Tobacco Use: Low Risk (04/09/2024)   Patient History     Smoking Tobacco Use: Never    Smokeless Tobacco Use: Never    Passive Exposure: Not on file  Financial Resource Strain: Low Risk (04/05/2024)   Overall Financial Resource Strain (CARDIA)    Difficulty of Paying Living Expenses: Not hard at all  Food Insecurity: No Food Insecurity (04/05/2024)   Epic    Worried About Programme Researcher, Broadcasting/film/video in the Last Year: Never true    Ran Out of Food in the Last Year: Never true  Transportation Needs: No Transportation Needs (04/05/2024)   Epic    Lack of Transportation (Medical): No    Lack of Transportation (Non-Medical): No  Physical Activity: Sufficiently Active (04/05/2024)   Exercise Vital Sign    Days of Exercise per Week: 3 days    Minutes of Exercise per Session: 90 min  Stress: Stress Concern Present (04/05/2024)   [employer] of Occupational Health - Occupational Stress Questionnaire    Feeling of Stress: To some extent  Social Connections: Socially Integrated (04/05/2024)   Social Connection and Isolation Panel    Frequency of Communication with Friends and Family: More than three times a week    Frequency of Social Gatherings with Friends and Family: Twice a week    Attends Religious Services: More than 4 times per year    Active Member of [employer] or Organizations: Yes    Attends Engineer, Structural: More than 4 times per year    Marital Status: Married  Catering Manager Violence: Not At Risk (09/22/2021)   Humiliation, Afraid, Rape, and Kick questionnaire    Fear of Current or Ex-Partner: No    Emotionally Abused: No    Physically Abused: No    Sexually Abused: No  Depression (PHQ2-9): Low Risk (04/09/2024)   Depression (PHQ2-9)    PHQ-2 Score: 0  Alcohol Screen: Low Risk (09/22/2021)   Alcohol Screen    Last Alcohol Screening Score (AUDIT): 0  Housing: Low Risk (04/05/2024)   Epic    Unable to Pay for Housing in the Last Year: No    Number of Times Moved in the Last Year: 0    Homeless in  the Last Year: No  Utilities: Not At Risk (02/13/2023)   [facility] Utilities    Threatened with loss of utilities: No  Health Literacy: Not on file      Review of Systems  All other systems reviewed and  are negative.      Objective:   Physical Exam Constitutional:      Appearance: He is well-developed.  HENT:     Head: Normocephalic and atraumatic.  Cardiovascular:     Rate and Rhythm: Normal rate and regular rhythm.     Heart sounds: Normal heart sounds.  Pulmonary:     Effort: Pulmonary effort is normal. No respiratory distress.     Breath sounds: Normal breath sounds. No wheezing or rales.  Abdominal:     Hernia: There is no hernia in the left inguinal area.  Musculoskeletal:     Cervical back: Normal range of motion and neck supple.     Right knee: No bony tenderness or crepitus. Normal range of motion. No tenderness. No LCL laxity, MCL laxity, ACL laxity or PCL laxity. Normal meniscus and normal patellar mobility.     Instability Tests: Anterior drawer test negative. Posterior drawer test negative. Anterior Lachman test negative. Medial McMurray test negative and lateral McMurray test negative.  Lymphadenopathy:     Lower Body: No right inguinal adenopathy. No left inguinal adenopathy.           Assessment & Plan:  Contusion of right knee, initial encounter I see no evidence of any ligamentous tear or soft tissue injury around the knee.  I believe he suffered a simple contusion.  Recommended activity as tolerated.  Anticipate total resolution over the next 7 to 10 days "

## 2024-06-22 ENCOUNTER — Other Ambulatory Visit: Payer: Self-pay

## 2024-06-22 ENCOUNTER — Telehealth: Payer: Self-pay

## 2024-06-22 MED ORDER — ATORVASTATIN CALCIUM 40 MG PO TABS: 40.0000 mg | ORAL_TABLET | Freq: Every day | ORAL | 0 refills | Status: AC

## 2024-06-22 NOTE — Telephone Encounter (Signed)
 Sent in medication

## 2024-06-22 NOTE — Telephone Encounter (Signed)
 Prescription Request  06/22/2024  LOV: Visit date not found  What is the name of the medication or equipment? atorvastatin  (LIPITOR) 40 MG tablet   Have you contacted your pharmacy to request a refill? Yes   Which pharmacy would you like this sent to?  [id number] [place], [place] - [address] [place] [address] Phone: [phone] Fax: [phone]    Patient notified that their request is being sent to the clinical staff for review and that they should receive a response within 2 business days.   Please advise at Mobile [phone] (mobile)
